# Patient Record
Sex: Female | Born: 1955 | Race: Black or African American
Health system: Southern US, Community
[De-identification: ages and names within clinical notes are randomized; demographics above are authoritative.]

## PROBLEM LIST (undated history)

## (undated) DIAGNOSIS — J45909 Unspecified asthma, uncomplicated: Secondary | ICD-10-CM

## (undated) DIAGNOSIS — E119 Type 2 diabetes mellitus without complications: Secondary | ICD-10-CM

## (undated) DIAGNOSIS — R609 Edema, unspecified: Secondary | ICD-10-CM

## (undated) DIAGNOSIS — D573 Sickle-cell trait: Secondary | ICD-10-CM

## (undated) DIAGNOSIS — I10 Essential (primary) hypertension: Secondary | ICD-10-CM

## (undated) DIAGNOSIS — J40 Bronchitis, not specified as acute or chronic: Secondary | ICD-10-CM

## (undated) DIAGNOSIS — M1990 Unspecified osteoarthritis, unspecified site: Secondary | ICD-10-CM

## (undated) DIAGNOSIS — Z8639 Personal history of other endocrine, nutritional and metabolic disease: Secondary | ICD-10-CM

## (undated) DIAGNOSIS — N390 Urinary tract infection, site not specified: Secondary | ICD-10-CM

## (undated) DIAGNOSIS — Z9109 Other allergy status, other than to drugs and biological substances: Secondary | ICD-10-CM

## (undated) DIAGNOSIS — G4730 Sleep apnea, unspecified: Secondary | ICD-10-CM

## (undated) DIAGNOSIS — I879 Disorder of vein, unspecified: Secondary | ICD-10-CM

## (undated) DIAGNOSIS — T884XXA Failed or difficult intubation, initial encounter: Secondary | ICD-10-CM

## (undated) DIAGNOSIS — D763 Other histiocytosis syndromes: Secondary | ICD-10-CM

## (undated) DIAGNOSIS — R112 Nausea with vomiting, unspecified: Secondary | ICD-10-CM

## (undated) DIAGNOSIS — E785 Hyperlipidemia, unspecified: Secondary | ICD-10-CM

## (undated) DIAGNOSIS — Z9889 Other specified postprocedural states: Secondary | ICD-10-CM

## (undated) DIAGNOSIS — E042 Nontoxic multinodular goiter: Secondary | ICD-10-CM

## (undated) DIAGNOSIS — R911 Solitary pulmonary nodule: Secondary | ICD-10-CM

## (undated) HISTORY — PX: CHOLECYSTECTOMY: SHX55

## (undated) HISTORY — PX: EXPLORATORY LAPAROTOMY: SUR591

## (undated) HISTORY — PX: ROTATOR CUFF REPAIR: SHX139

## (undated) HISTORY — PX: APPENDECTOMY: SHX54

## (undated) HISTORY — DX: Unspecified asthma, uncomplicated: J45.909

## (undated) HISTORY — PX: SINUS SURGERY: SHX187

## (undated) HISTORY — DX: Other allergy status, other than to drugs and biological substances: Z91.09

## (undated) HISTORY — PX: COLON SURGERY: SHX602

## (undated) HISTORY — PX: COLONOSCOPY: SHX174

## (undated) HISTORY — PX: EGD: SHX3789

## (undated) HISTORY — PX: SHOULDER ARTHROSCOPY: SHX128

## (undated) HISTORY — DX: Essential (primary) hypertension: I10

## (undated) HISTORY — PX: APPENDECTOMY (OPEN): SHX54

## (undated) HISTORY — PX: HYSTERECTOMY: SHX81

---

## 1996-03-31 ENCOUNTER — Emergency Department: Admit: 1996-03-31 | Payer: Self-pay | Admitting: Emergency Medicine

## 2003-08-24 HISTORY — PX: NASAL SINUS SURGERY: SHX719

## 2008-12-24 ENCOUNTER — Ambulatory Visit
Admit: 2008-12-24 | Disposition: A | Discharge status: Home / Self Care | Payer: Self-pay | Source: Ambulatory Visit | Admitting: Orthopaedic Surgery

## 2008-12-24 LAB — CBC AND DIFFERENTIAL
Basophils Absolute: 0 /mm3 (ref 0.0–0.2)
Basophils: 1 % (ref 0–2)
Eosinophils Absolute: 0.2 /mm3 (ref 0.0–0.7)
Eosinophils: 3 % (ref 0–5)
Granulocytes Absolute: 5.4 /mm3 (ref 1.8–8.1)
Hematocrit: 38.1 % (ref 37.0–47.0)
Hgb: 13.1 G/DL (ref 12.0–16.0)
Lymphocytes Absolute: 2.6 /mm3 (ref 0.5–4.4)
Lymphocytes: 30 % (ref 15–41)
MCH: 28.7 PG (ref 28.0–32.0)
MCHC: 34.4 G/DL (ref 32.0–36.0)
MCV: 83.6 FL (ref 80.0–100.0)
MPV: 12.5 FL — ABNORMAL HIGH (ref 9.4–12.3)
Monocytes Absolute: 0.5 /mm3 (ref 0.0–1.2)
Monocytes: 6 % (ref 0–11)
Neutrophils %: 62 % (ref 52–75)
Platelets: 206 /mm3 (ref 140–400)
RBC: 4.56 /mm3 (ref 4.20–5.40)
RDW: 15 % (ref 11.5–15.0)
WBC: 8.75 /mm3 (ref 3.50–10.80)

## 2008-12-24 LAB — SEDIMENTATION RATE: Sed Rate: 18 MM/HR (ref 0–26)

## 2008-12-24 LAB — TSH: TSH: 0.33 u[IU]/mL — ABNORMAL LOW (ref 0.35–4.94)

## 2008-12-24 LAB — URIC ACID: Uric acid: 6 MG/DL (ref 2.5–7.5)

## 2008-12-24 LAB — C-REACTIVE PROTEIN: C-Reactive Protein: <0.5 mg/dL (ref 0.01–0.82)

## 2008-12-25 LAB — HLA-B27, B-SOFT

## 2008-12-30 LAB — ANA SCREEN REFLEX

## 2010-08-23 DIAGNOSIS — D763 Other histiocytosis syndromes: Secondary | ICD-10-CM

## 2010-08-23 HISTORY — DX: Other histiocytosis syndromes: D76.3

## 2011-05-19 DIAGNOSIS — T884XXA Failed or difficult intubation, initial encounter: Secondary | ICD-10-CM

## 2011-05-19 HISTORY — DX: Failed or difficult intubation, initial encounter: T88.4XXA

## 2012-12-05 ENCOUNTER — Ambulatory Visit
Admission: RE | Admit: 2012-12-05 | Discharge: 2012-12-05 | Disposition: A | Discharge status: Home / Self Care | Source: Ambulatory Visit | Attending: Critical Care Medicine | Admitting: Critical Care Medicine

## 2012-12-05 DIAGNOSIS — R0602 Shortness of breath: Secondary | ICD-10-CM | POA: Insufficient documentation

## 2012-12-05 MED ORDER — SODIUM CHLORIDE 0.9 % IV SOLN
2.00 mL | Freq: Once | INTRAVENOUS | Status: AC
Start: 2012-12-05 — End: 2012-12-05
  Administered 2012-12-05: 2 mL via RESPIRATORY_TRACT
  Filled 2012-12-05: qty 5

## 2012-12-05 MED ORDER — METHACHOLINE CHLORIDE 100 MG IN SOLR
2.00 mL | Freq: Once | RESPIRATORY_TRACT | Status: DC
Start: 2012-12-05 — End: 2012-12-06
  Filled 2012-12-05: qty 50

## 2012-12-05 MED ORDER — SODIUM CHLORIDE 0.9 % IV SOLN
2.00 mL | Freq: Once | INTRAVENOUS | Status: AC
Start: 2012-12-05 — End: 2012-12-05
  Administered 2012-12-05: 2 mL via RESPIRATORY_TRACT
  Filled 2012-12-05: qty 0.05

## 2012-12-05 MED ORDER — METHACHOLINE CHLORIDE 100 MG IN SOLR
2.00 mL | Freq: Once | RESPIRATORY_TRACT | Status: DC
Start: 2012-12-05 — End: 2012-12-06
  Filled 2012-12-05: qty 20

## 2012-12-05 MED ORDER — SODIUM CHLORIDE 0.9 % IV SOLN
2.00 mL | Freq: Once | INTRAVENOUS | Status: AC
Start: 2012-12-05 — End: 2012-12-05
  Administered 2012-12-05: 2 mL via RESPIRATORY_TRACT
  Filled 2012-12-05: qty 0.5

## 2012-12-05 MED ORDER — ALBUTEROL SULFATE (2.5 MG/3ML) 0.083% IN NEBU
2.50 mg | INHALATION_SOLUTION | Freq: Four times a day (QID) | RESPIRATORY_TRACT | Status: AC | PRN
Start: 2012-12-05 — End: 2012-12-05
  Administered 2012-12-05: 2.5 mg via RESPIRATORY_TRACT
  Filled 2012-12-05: qty 3

## 2012-12-06 NOTE — Procedures (Signed)
Service Date: 12/05/2012     Patient Type: O     PHYSICIAN/PROVIDER: Marisa Sprinkles MD     REFERRING PHYSICIAN:      The methacholine challenge test was positive at level 3, implying reactive  airway disease.           D:  12/06/2012 12:34 PM by Dr. Jonny Ruiz B. Tamera Punt, MD [phone])  T:  12/06/2012 20:32 PM by [id number]      Everlean Cherry: [id number]) (Doc ID: [id number])

## 2016-03-23 ENCOUNTER — Encounter (INDEPENDENT_AMBULATORY_CARE_PROVIDER_SITE_OTHER): Payer: Self-pay

## 2016-04-01 ENCOUNTER — Ambulatory Visit (INDEPENDENT_AMBULATORY_CARE_PROVIDER_SITE_OTHER): Payer: Enrolled Prime—HMO | Admitting: Sports Medicine

## 2016-04-01 ENCOUNTER — Encounter (INDEPENDENT_AMBULATORY_CARE_PROVIDER_SITE_OTHER): Payer: Self-pay | Admitting: Sports Medicine

## 2016-04-01 VITALS — BP 104/57 | HR 90 | Ht 63.0 in | Wt 230.0 lb

## 2016-04-01 DIAGNOSIS — M7662 Achilles tendinitis, left leg: Secondary | ICD-10-CM

## 2016-04-01 NOTE — Progress Notes (Signed)
Chief Complaint   Patient presents with   . Ankle Pain     Left ankle     HPI:  Stacy Coleman is a 60 y.o.-year-old female who presents with c/o left ankle pain onset about 2 years ago. Her pain began in the posterior aspect of her heel. It then migrated to the lateral aspect of her ankle and at what sounds like the medial calcaneal tuberosity. There is associated swelling. She was seen by Dr. Herbert Pun who diagnosed her with achilles tendinopathy and plantar fasciitis. He has tried multiple treatment interventions. She has tried cortisone injections in her plantar fascia and lateral ankle (x8) with moderate short-term relief. She has been to PT and tried oral and topical NSAIDs. She began to feel like her heel became "squishy" so Dr. Herbert Pun recommended that she stop the cortisone injections and see me to try PRP. She has also tried dry needling and orthotics without relief.    She does recall having x-rays and an MRI in the past, but does not have these records available.    PMH:    Past Medical History   Diagnosis Date   . Asthma    . Environmental allergies    . Hypertension        Social History:   Social History   Substance Use Topics   . Smoking status: Never Smoker    . Smokeless tobacco: None   . Alcohol Use: Yes       Family History:    Family History   Problem Relation Age of Onset   . Diabetes Mother        Past Surgical History:    Past Surgical History   Procedure Laterality Date   . Shoulder arthroscopy     . Shoulder surgery     . Appendectomy     . Hysterectomy         Medications:    Current outpatient prescriptions:   .  ASPIR-LOW 81 MG EC tablet, , Disp: , Rfl:   .  felodipine (PLENDIL) 10 MG 24 hr tablet, , Disp: , Rfl:   .  loratadine (CLARITIN) 10 MG tablet, , Disp: , Rfl:   .  metFORMIN (GLUCOPHAGE-XR) 500 MG 24 hr tablet, , Disp: , Rfl:   .  metoprolol XL (TOPROL-XL) 25 MG 24 hr tablet, , Disp: , Rfl:   .  emollient cream, , Disp: , Rfl:   .  hydroCHLOROthiazide (HYDRODIURIL) 25 MG tablet, ,  Disp: , Rfl:   .  JANUVIA 50 MG tablet, , Disp: , Rfl:   .  lisinopril (PRINIVIL,ZESTRIL) 40 MG tablet, , Disp: , Rfl:   .  MAPAP 500 MG tablet, , Disp: , Rfl:   .  naproxen (NAPROSYN) 250 MG tablet, , Disp: , Rfl:     Allergies:    Allergies   Allergen Reactions   . Codeine        ROS:   All other systems were reviewed and are negative except as previously mentioned in the HPI.    EXAM:   BP 104/57 mmHg  Pulse 90  Ht 1.6 m (5\' 3" )  Wt 104.327 kg (230 lb)  BMI 40.75 kg/m2  Constitutional: Pt is well-developed, well-nourished, and in no distress.   HENT:   Head: Normocephalic and atraumatic.   Eyes: Conjunctivae are normal.   Neck: Neck supple.   Cardiovascular: Normal rate  Pulmonary/Chest: Effort normal.   Neurological: Pt is alert and oriented to person, place,  and time.   Skin: Skin is warm and dry. Pt is not diaphoretic.   Psychiatric: Affect normal.     Left Ankle  Observation: normal gait; + bilateral ankle edema  Palpation: tender over achilles tendon  ROM: FROM  Drawer Test: Negative  Cotton Test: negative  Tib-Fib Squeeze Test: negative  Talar Tilt Test: negative  External Rotation: negative  Dorsolateral Compression: negative  Strength testing:                                  Plantar flexion - 5 / 5                                  Eversion - 5 / 5                                  Inversion - 5 / 5                                  Dorsiflexion - 5 / 5   Thompson's Test: normal  Too Many Toes Sign: negative  Rise on Toe: normal heel varus - reproduces pain in left heel  Distal Sensory: normal  Tinel's: negative over tarsal tunnel  Distal pulses:  Intact    STUDIES: none new    ASSESSMENT/PLAN:   1. Left Achilles tendinitis       Stacy Coleman is a very pleasant 60 yo female with chronic left achilles tendinopathy that has failed multiple treatment attempts with NSAIDs, steroid injections, PT, rest, ice, orthotics. Treatment options discussed with patient at length, including risks, benefits, alternatives, and  the nature of any potential procedures for the problem. Patient elected for a trial of ultrasound guided PRP coupled with another course of PT. She was made aware of the experimental nature of this procedure. She will follow-up for this procedure at her convenience and bring the results of her prior imaging studies to this appointment. All of her questions were answered.    Stacy Dunk, MD [facility]  Primary Care Sports Medicine Physician  [facility]

## 2016-04-23 ENCOUNTER — Ambulatory Visit (INDEPENDENT_AMBULATORY_CARE_PROVIDER_SITE_OTHER): Payer: Enrolled Prime—HMO | Admitting: Sports Medicine

## 2016-04-23 ENCOUNTER — Encounter (INDEPENDENT_AMBULATORY_CARE_PROVIDER_SITE_OTHER): Payer: Self-pay | Admitting: Sports Medicine

## 2016-04-23 VITALS — BP 148/86 | HR 90 | Ht 63.0 in | Wt 230.0 lb

## 2016-04-23 DIAGNOSIS — M7662 Achilles tendinitis, left leg: Secondary | ICD-10-CM

## 2016-04-23 MED ORDER — TRAMADOL HCL 50 MG PO TABS
100.0000 mg | ORAL_TABLET | Freq: Four times a day (QID) | ORAL | Status: DC | PRN
Start: 2016-04-23 — End: 2016-06-23

## 2016-04-23 NOTE — Progress Notes (Signed)
Ultrasound-guided PRP Procedure Note     Brief History:  60 yo female with left achilles tendinopathy refractory to conservative measures presents for a trial of an ultrasound-guided PRP injection. Ultrasound evaluation prior to the procedure revealed significant tendon thickening and tendinosis with enthesopathy.    Procedure:  Pre-procedure Diagnosis: left achilles tendinopathy  Post-procedure Diagnosis: (same)  Indications: Treatment of symptomatic left achilles tendinopathy; therapeutic purposes  Reason for ultrasound guidance: Required for localization of achilles tendonosis, needle guidance during the injection, avoidance of nearby neurovascular structures, and patient comfort  Verbal informed consent was obtained prior to the procedure. Prior to this, the risks and side effects were discussed, including, but not limited to pain, infection, allergic reaction, bleeding, damage to tissues, vessels and nerves, tendon damage/rupture, steroid flare.    15 cc of blood was aspirated from the antecubital fossa and PRP was prepared in the patient's room in the usual fashion using the Ar[employer] ACP system.    Using the [employer] e [place] system, I performed a ULTRASOUND guided injection of her left achilles tendon in the area of tendinosis via a lateral, out-of-plane approach. Localization of nearby nerves and vasculature was performed prior to the procedure, taking care to avoid these structures. The patient was prepped with chlorhexidine x2 and 2 cc of 1% lidocaine prior to the procedure. Injection was performed using needle localization with the 12L Probe in long axis to the tendon with needle oriented in short axis to the probe, using a 22 ga 1.5" needle. 5 mL of PRP was then injected into the area of achilles tendinosis and the surround tissues. Several passes of needling were also performed. Permanent recording of images was done.    Complications: None; patient tolerated the procedure well.    Follow-up: Post-procedure  education and instructions were provided. The patient was instructed to use a walking boot for 1-2 days then wean as tolerated. Physical therapy was scheduled and our PRP rehab protocol was provided. The patient will follow-up in 8 weeks for a repeat ultrasound exam or sooner as needed. A prescription for Tramadol was provided for post-procedure pain.    Teena Dunk, MD  Primary Care Sports Medicine Physician  [facility]

## 2016-04-28 ENCOUNTER — Encounter (INDEPENDENT_AMBULATORY_CARE_PROVIDER_SITE_OTHER): Payer: Self-pay

## 2016-04-28 NOTE — Progress Notes (Signed)
Spoke with patient who is unable to find her PT order.  Faxed to Ms Yetta Barre at [phone] and confirmation received.  Patient is made aware that order is sent.

## 2016-06-16 NOTE — Addendum Note (Signed)
Addended by: Teena Dunk on: 06/16/2016 12:53 PM     Modules accepted: Orders

## 2016-06-23 ENCOUNTER — Ambulatory Visit (INDEPENDENT_AMBULATORY_CARE_PROVIDER_SITE_OTHER): Payer: Enrolled Prime—HMO | Admitting: Sports Medicine

## 2016-06-23 ENCOUNTER — Encounter (INDEPENDENT_AMBULATORY_CARE_PROVIDER_SITE_OTHER): Payer: Self-pay | Admitting: Sports Medicine

## 2016-06-23 VITALS — BP 133/67 | HR 75 | Ht 63.0 in | Wt 228.0 lb

## 2016-06-23 DIAGNOSIS — M7662 Achilles tendinitis, left leg: Secondary | ICD-10-CM

## 2016-06-23 DIAGNOSIS — M6788 Other specified disorders of synovium and tendon, other site: Secondary | ICD-10-CM

## 2016-06-23 NOTE — Progress Notes (Signed)
Chief Complaint   Patient presents with   . Ankle Pain     LEFT      HPI:  Stacy Coleman is a 60 y.o.-year-old female who presents for a re-evaluation about 8 weeks s/p PRP injection to her left achilles tendon for chronic tendinosis. She reports mild improvement since her last visit. She has been going to PT regularly and states this has been helping. She is still having trouble with walking long distances. Denies numbness, tingling, fevers, chills, other associated symptoms.    PMH:    Past Medical History:   Diagnosis Date   . Asthma    . Environmental allergies    . Hypertension        Past Surgical History:    Past Surgical History:   Procedure Laterality Date   . APPENDECTOMY     . HYSTERECTOMY     . SHOULDER ARTHROSCOPY     . SHOULDER SURGERY         Social History:   Social History   Substance Use Topics   . Smoking status: Never Smoker   . Smokeless tobacco: Not on file   . Alcohol use Yes       ROS:   All other systems were reviewed and are negative except as previously mentioned in the HPI.    EXAM:   BP 133/67 (BP Site: Right arm, Patient Position: Sitting, Cuff Size: Large)   Pulse 75   Ht 1.6 m (5\' 3" )   Wt 103.4 kg (228 lb)   BMI 40.39 kg/m   Left Ankle  Observation: + bilateral ankle edema  Palpation: tender over achilles tendon  ROM: FROM  Drawer Test: Negative  Cotton Test: negative  Tib-Fib Squeeze Test: negative  Talar Tilt Test: negative  External Rotation: negative  Dorsolateral Compression: negative  Strength testing:                                  Plantar flexion - 5 / 5                                  Eversion - 5 / 5                                  Inversion - 5 / 5                                  Dorsiflexion - 5 / 5   Thompson's Test: normal  Distal Sensory: normal  Tinel's: negative over tarsal tunnel  Distal pulses:  Intact    STUDIES: Ultrasound evaluation revealed significant tendon thickening and tendinosis with enthesopathy. This was mildly improved from her last  exam.    ASSESSMENT/PLAN:   1. Achilles tendinosis of left lower extremity  Ambulatory referral to Physical Therapy     Treatment options discussed with patient at length, including risks, benefits, alternatives, and the nature of any potential procedures for the problem.    Recommendations:  Relative rest and activity modification  ice / heat as needed for comfort  Physical therapy - Rx provided   Progress as tolerated  Follow up in 8 weeks or sooner as needed if symptoms worsen  Teena Dunk, MD, [facility]  Primary Care Sports Medicine Physician  [facility]

## 2016-09-01 ENCOUNTER — Encounter (INDEPENDENT_AMBULATORY_CARE_PROVIDER_SITE_OTHER): Payer: Self-pay | Admitting: Sports Medicine

## 2016-09-01 ENCOUNTER — Ambulatory Visit (INDEPENDENT_AMBULATORY_CARE_PROVIDER_SITE_OTHER): Payer: Enrolled Prime—HMO | Admitting: Sports Medicine

## 2016-09-01 VITALS — BP 124/80 | HR 78 | Resp 17 | Ht 63.0 in | Wt 227.0 lb

## 2016-09-01 DIAGNOSIS — M6788 Other specified disorders of synovium and tendon, other site: Secondary | ICD-10-CM

## 2016-09-01 DIAGNOSIS — M7662 Achilles tendinitis, left leg: Secondary | ICD-10-CM

## 2016-09-01 NOTE — Progress Notes (Signed)
Chief Complaint   Patient presents with   . Follow-up     Left achilles tendinosis.     HPI:  Stacy Coleman is a 61 y.o.-year-old female who presents for a re-evaluation of her chronic left achilles tendinopathy about 4 months s/p PRP injection. She reports mild to moderate overall improvement since the PRP injection. She has been going to PT regularly and states this has been helping. However, she is still having pain, especially with prolonged walking. Denies numbness, tingling, fevers, chills, other associated symptoms. Her PT has primarily been working on deep tissue massage and not much strengthening or mobility.    PMH:    Past Medical History:   Diagnosis Date   . Asthma    . Environmental allergies    . Hypertension        Past Surgical History:    Past Surgical History:   Procedure Laterality Date   . APPENDECTOMY     . HYSTERECTOMY     . SHOULDER ARTHROSCOPY     . SHOULDER SURGERY         Social History:   Social History   Substance Use Topics   . Smoking status: Never Smoker   . Smokeless tobacco: Never Used   . Alcohol use Yes       ROS:   All other systems were reviewed and are negative except as previously mentioned in the HPI.    EXAM:   BP 124/80   Pulse 78   Resp 17   Ht 1.6 m (5\' 3" )   Wt 103 kg (227 lb)   BMI 40.21 kg/m   Left Ankle  Observation: + bilateral ankle edema  Palpation: tender over achilles tendon insertion  ROM: FROM  Drawer Test: Negative  Cotton Test: negative  Tib-Fib Squeeze Test: negative  Talar Tilt Test: negative  External Rotation: negative  Dorsolateral Compression: negative  Strength testing:                                  Plantar flexion - 5 / 5                                  Eversion - 5 / 5                                  Inversion - 5 / 5                                  Dorsiflexion - 5 / 5   Thompson's Test: normal  Distal Sensory: normal  Tinel's: negative over tarsal tunnel  Distal pulses:  Intact    STUDIES: Repeat ultrasound evaluation today revealed  significant tendon thickening and tendinosis with enthesopathy, primarily surrounding tendon insertion. This was mildly improved from her initial exam.    ASSESSMENT/PLAN:   1. Achilles tendinosis of left lower extremity  Ambulatory referral to Physical Therapy     Stacy Coleman is a very pleasant 61 yo female with chronic left achilles tendinopathy now 4 months s/p PRP injection under u/s-guidance with mild to moderate overall relief. Treatment options discussed with patient at length, including risks, benefits, alternatives, and the nature of any potential procedures for the  problem.    Recommendations:  Relative rest and activity modification  ice / heat as needed for comfort  Physical therapy - Rx provided   Work more on stretching, strengthening  Dry needling if desired  Follow up in 2-3 months as needed  The future treatment options of repeat PRP versus surgical referral were discussed today    25 minutes were spent face-to-face with the patient, with coordination of care and counseling about disease process and expect recovery comprising > 50 percent of the visit.    Teena Dunk, MD, [facility]  Primary Care Sports Medicine Physician  [facility]

## 2016-11-17 ENCOUNTER — Encounter (INDEPENDENT_AMBULATORY_CARE_PROVIDER_SITE_OTHER): Payer: Self-pay | Admitting: Sports Medicine

## 2016-11-17 ENCOUNTER — Ambulatory Visit (INDEPENDENT_AMBULATORY_CARE_PROVIDER_SITE_OTHER): Payer: TRICARE Prime—HMO | Admitting: Sports Medicine

## 2016-11-17 VITALS — BP 143/79 | HR 89 | Resp 18 | Ht 63.0 in | Wt 228.0 lb

## 2016-11-17 DIAGNOSIS — M7662 Achilles tendinitis, left leg: Secondary | ICD-10-CM

## 2016-11-17 NOTE — Progress Notes (Signed)
Chief Complaint   Patient presents with   . Ankle Pain     F/U-Ankle pain X 1 year     HPI:  Stacy Coleman is a 61 y.o.-year-old female who presents for a re-evaluation of her chronic left achilles tendinopathy about 6 months s/p PRP injection. She reports an overall improvement of about 50% since before the PRP injection. She continues to do PT and a HEP. The dry needling seems to be particularly effective for pain relief. She is also using compression a couple of day per week with mild relief. She is pleased with her progress. She denies fevers, chills, bruising, numbness, tingling, other associated symptoms except as listed above.    PMH:    Past Medical History:   Diagnosis Date   . Asthma    . Environmental allergies    . Hypertension        Past Surgical History:    Past Surgical History:   Procedure Laterality Date   . APPENDECTOMY     . HYSTERECTOMY     . SHOULDER ARTHROSCOPY     . SHOULDER SURGERY         Social History:   Social History   Substance Use Topics   . Smoking status: Never Smoker   . Smokeless tobacco: Never Used   . Alcohol use Yes      Comment: rarely       ROS:   All other systems were reviewed and are negative except as previously mentioned in the HPI.    EXAM:   BP 143/79   Pulse 89   Resp 18   Ht 1.6 m (5\' 3" )   Wt 103.4 kg (228 lb)   BMI 40.39 kg/m   Left Ankle  Observation: + bilateral ankle edema  Palpation: tender over achilles tendon insertion  ROM: FROM  Drawer Test: Negative  Cotton Test: negative  Tib-Fib Squeeze Test: negative  Talar Tilt Test: negative  External Rotation: negative  Dorsolateral Compression: negative  Strength testing:                                  Plantar flexion - 5 / 5                                  Eversion - 5 / 5                                  Inversion - 5 / 5                                  Dorsiflexion - 5 / 5   Thompson's Test: normal  Distal Sensory: normal  Tinel's: negative over tarsal tunnel  Distal pulses:  Intact    STUDIES: none  new    ASSESSMENT/PLAN:   1. Left Achilles tendinitis  Ambulatory referral to Physical Therapy     Stacy Coleman is a very pleasant 61 yo female with chronic left achilles tendinopathy s/p PRP injection under u/s-guidance with moderate overall relief. She is pleased with her progress and would like to continue PT with dry needling as this seems to be helping. The option of a repeat PRP injection was discussed. We will  hold off on this for now as she is improving. She will follow-up with me on an as needed basis.    Recommendations:  Relative rest and activity modification  ice / heat as needed for comfort  Physical therapy - Rx provided   Dry needling  Follow up as needed    Teena Dunk, MD, [facility]  Primary Care Sports Medicine Physician  [facility]

## 2017-01-21 DIAGNOSIS — J189 Pneumonia, unspecified organism: Secondary | ICD-10-CM

## 2017-01-21 HISTORY — DX: Pneumonia, unspecified organism: J18.9

## 2017-03-09 ENCOUNTER — Ambulatory Visit (INDEPENDENT_AMBULATORY_CARE_PROVIDER_SITE_OTHER): Payer: TRICARE Prime—HMO | Admitting: Sports Medicine

## 2017-03-09 ENCOUNTER — Encounter (INDEPENDENT_AMBULATORY_CARE_PROVIDER_SITE_OTHER): Payer: Self-pay | Admitting: Sports Medicine

## 2017-03-09 VITALS — BP 128/67 | HR 82 | Ht 63.0 in | Wt 228.0 lb

## 2017-03-09 DIAGNOSIS — M6788 Other specified disorders of synovium and tendon, other site: Secondary | ICD-10-CM

## 2017-03-09 DIAGNOSIS — M7662 Achilles tendinitis, left leg: Secondary | ICD-10-CM

## 2017-03-09 NOTE — Progress Notes (Signed)
Chief Complaint   Patient presents with   . Tendonitis     F/U left achilles pain, chronic     HPI:  Stacy Coleman is a 61 y.o.-year-old female who presents for a re-evaluation of her chronic left achilles tendinopathy about 10 months s/p PRP injection. She has now completed a course of PT and is doing her HEP. She will still occasionally feel "twinges" of pain. However, overall she is very pleased with her progress.     PMH:    Past Medical History:   Diagnosis Date   . Asthma    . Environmental allergies    . Hypertension    . Pneumonia 01/2017       Past Surgical History:    Past Surgical History:   Procedure Laterality Date   . APPENDECTOMY     . HYSTERECTOMY     . SHOULDER ARTHROSCOPY     . SHOULDER SURGERY         Social History:   Social History   Substance Use Topics   . Smoking status: Never Smoker   . Smokeless tobacco: Never Used   . Alcohol use Yes      Comment: rarely       ROS:   All other systems were reviewed and are negative except as previously mentioned in the HPI.    EXAM:   BP 128/67   Pulse 82   Ht 1.6 m (5\' 3" )   Wt 103.4 kg (228 lb)   BMI 40.39 kg/m   Left Ankle  Observation: + bilateral ankle edema  Palpation: tender over achilles tendon insertion  ROM: FROM  Drawer Test: Negative  Cotton Test: negative  Tib-Fib Squeeze Test: negative  Talar Tilt Test: negative  External Rotation: negative  Dorsolateral Compression: negative  Strength testing:                                  Plantar flexion - 5 / 5                                  Eversion - 5 / 5                                  Inversion - 5 / 5                                  Dorsiflexion - 5 / 5   Thompson's Test: normal  Distal Sensory: normal  Tinel's: negative over tarsal tunnel  Distal pulses:  Intact    STUDIES: none new    ASSESSMENT/PLAN:   1. Achilles tendinosis of left lower extremity       Stacy Coleman is a very pleasant 61 yo female with chronic left achilles tendinopathy, improved s/p PRP injection under u/s-guidance in  September of 2017. She is pleased with her progress. I have recommended continuing her HEP and other conservative strategies as discussed at her last visit. She will follow-up with me on an as needed basis.    Teena Dunk, MD, [facility]  Primary Care Sports Medicine Physician  [facility]

## 2020-08-20 ENCOUNTER — Telehealth: Payer: Self-pay

## 2020-08-20 ENCOUNTER — Encounter: Payer: Self-pay | Admitting: Orthopaedic Surgery

## 2020-08-20 NOTE — Pre-Procedure Instructions (Signed)
___ Are you or anyone in your immediate family currently experiencing fever or any symptoms of acute respiratory illness (such as cough or shortness of breath)? No             ___   Are you or anyone in your immediate family currently experiencing body aches, congestion, or runny nose?  No             ___  If yes to previous question, when did symptoms began?              ___ Have you or an immediate family member traveled outside the [place] in the past 14 days? No              ___ Have you had close contact with a confirmed COVID-19 patient? Do you currently reside in a nursing home or long-term care facility? No              ___ Do you have any special requirements for care? No             ____  Are you employed in healthcare with direct patient contact? No    If  you or your family does develop symptoms, please contact your surgeon's office immediately.         Verbalized understanding    Cefazolin  Bedside glucose  Clinic appt 12-30    Last oncology note/recent labs needed from Dr. Iver Nestle [phone]    Chart sent for anesthesia review;sleep apnea/difficult intubation.

## 2020-08-20 NOTE — Progress Notes (Signed)
PREOPERATIVE CONSULTATION    Date Time:08/21/2020 10:22 AM  Patient Name: Stacy Coleman,Stacy Coleman    Reason for Consultation:     Preoperative Medical Risk Assessment    History:     HPI:   64 y.o. presents for a pre-operative appointment     Reason for Surgery: Left knee pain - osteoarthritis that has progressively gotten worse.  No relief with pain medication/conservative measures at this time.  Causing difficulty with activities of daily living.  Now requiring surgical intervention.      Diagnosis: Primary osteoarthritis of left knee [M17.12]    Procedure:  elective Procedure(s):  ARTHROPLASTY, KNEE,  MEDIAL UNICONDYLAR PROSTHESIS for worsening pain.     Date of Surgery: 09/05/2020    Requesting Surgeon:  Lamonte Sakai, MD    Recent Health (admits): no current complaints  Recent Health (denies): fever, chest pain and dyspnea  Exercise Tolerance:can perform 4 or greater METS activity without symptoms  Surgical Risk Factors: Diabetes, hypertension, sleep apnea,  morbid obesity with BMI of 42, asthma  Prior Anesthesia: Patient reports prior difficulty with intubation   NOTE form anesthesiologist: (scanned to chart.) Pt with hx of difficulty with intubation. Unsuccessfully used Mac 3, Glidescope 4, Glydescope 3. Successfully used Fiberoptic scope.     She feels generally well.       Past Medical History:     Past Medical History:   Diagnosis Date   . Arthritis    . Asthma    . Bronchitis    . Environmental allergies    . Coleman/O hyperlipidemia    . 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 Hypertension    . Pneumonia 01/2017   . Rosai-Dorfman disease 2012   . Sickle cell trait    . Sleep apnea     cpap   . Swelling     bilat ankle/feet   . Type 2 diabetes mellitus, controlled    . Urinary tract infection     Coleman/o   . Vein disorder     varicose       Past Surgical History:     Past Surgical History:   Procedure Laterality Date   . APPENDECTOMY (OPEN)      open   . CHOLECYSTECTOMY      open   . COLON SURGERY      bowel obstruction repair but no  resection per pt   . COLONOSCOPY     . EGD     . HYSTERECTOMY      partial due to fibroids   . SHOULDER ARTHROSCOPY Left     x2   . SINUS SURGERY         Family History:     Family History   Problem Relation Age of Onset   . Diabetes Mother    . Hypertension Mother        Social History:     Social History     Socioeconomic History   . Marital status: Married   Tobacco Use   . Smoking status: Never Smoker   . Smokeless tobacco: Never Used   Vaping Use   . Vaping Use: Never used   Substance and Sexual Activity   . Alcohol use: Yes     Comment: glass wine/month   . Drug use: Not Currently     Types: Marijuana     Comment: last used in college       Allergies:     Allergies   Allergen Reactions   .  Simvastatin      Unknown reaction per pt   . Latex Hives and Rash       Medications:     Prior to Admission medications    Medication Sig Start Date End Date Taking? Authorizing Provider   acetaminophen (TYLENOL) 325 MG tablet Take 650 mg by mouth every 6 (six) hours as needed for Pain    [provider]   ADVAIR HFA 230-21 MCG/ACT inhaler 2 (two) times daily. 02/21/17   [provider]   Albuterol Sulfate, sensor, (ProAir Digihaler) 108 (90 Base) MCG/ACT Aerosol Pwdr, Breath Activated Inhale into the lungs as needed    [provider]   ASPIR-LOW 81 MG EC tablet Take 81 mg by mouth nightly    03/29/16   [provider]   azelastine (ASTELIN) 0.1 % nasal spray as needed. 10/20/16   [provider]   cetirizine (ZyrTEC) 10 MG tablet Take 10 mg by mouth nightly    [provider]   DEEP SEA NASAL SPRAY 0.65 % nasal spray  08/31/16   [provider]   felodipine (PLENDIL) 10 MG 24 hr tablet Take 10 mg by mouth every morning    02/19/16   [provider]   glimepiride (AMARYL) 1 MG tablet Take 1 mg by mouth every morning before breakfast    [provider]   hydroCHLOROthiazide (HYDRODIURIL) 25 MG tablet Take 25 mg by mouth every morning Takes Brand name  Oretic   02/19/16   [provider]   hydroCHLOROthiazide (MICROZIDE) 12.5 MG capsule daily.    [provider]   JANUVIA 50 MG tablet  02/19/16   [provider]   Jerene Dilling 10 MEQ tablet daily. 08/17/16   [provider]   lidocaine-prilocaine (EMLA) cream daily. 12/22/16   [provider]   lisinopril (PRINIVIL,ZESTRIL) 40 MG tablet Take 40 mg by mouth every morning    02/19/16   [provider]   loratadine (CLARITIN) 10 MG tablet  03/03/16   [provider]   metFORMIN (GLUCOPHAGE-XR) 500 MG 24 hr tablet  02/19/16   [provider]   metoprolol succinate XL (TOPROL-XL) 25 MG 24 hr tablet nightly. 08/17/16   [provider]   montelukast (SINGULAIR) 10 MG tablet Take 10 mg by mouth every morning    [provider]   REFRESH PLUS 0.5 % ophthalmic solution as needed. 03/01/17   [provider]   SITagliptin (JANUVIA) 50 MG tablet 2 (two) times daily.    [provider]   SITagliptin-MetFORMIN HCl (Janumet XR) [phone] MG Tablet SR 24 hr Take by mouth nightly    [provider]   Lactobacillus Reuteri (BIOGAIA) Chew Tab daily. 09/08/16 08/20/20  [provider]   See revised list in EPIC    Review of Systems:   A comprehensive review of systems was obtained from chart review and the patient.    ROS:  Constitutional: Negative fever/chills, general malaise. At baseline general health.   Eyes: Negative for new visual changes.  ENT: Negative for sore throat, chronic nasal discharge.   Respiratory: Negative for shortness of breath,chronic cough.   Cardiovascular: Negative for chest pain with exertion/rest.    Gastrointestinal: Negative for nausea/vomiting diarrhea, or bloody stool.   Endocrine: Negative for polydipsia and polyuria.  Genitourinary: Negative for urinary retention   Peripheral Vascular: Negative for exertional pain/cramping in legs.  Skin: Negative for rash or open wounds   Neurological:  Negative  for dizziness, pre-syncope, focal weakness  Hematological: neg for easy bruise/bleed. Note hx of Rosai-Dorfman syndrome   Musculoskeletal: Positive for joint pain      Physical Exam:     Vitals:    08/21/20 0914   BP: 154/84   Pulse: 70   Temp: 98.1 F (36.7 C)   SpO2: 100%       Physical Examination:   Constitutional:   Appears generally well with no distress.   Eyes: Non icteric.   Head: Normocephalic.   Mouth/Throat: Oropharynx is clear without erythema or exudate.   Mallampati: Class IV (only hard palate visible)  Neck: Neck supple,No lymphadenopathy.   Cardiovascular: RRR with normal S1/S2. No murmer  Pulmonary/Chest: BBS clear to auscultation bilaterally. No wheezes, rhonchi or rales. Non labored.   Abdominal: soft, no distension. No abdominal tenderness.  Extremities: No clubbing, cyanosis. No edema  Peripheral Pulses:+posterior tibial pulses palpable.   Neurological: No gross focal deficits  Psychiatric: Alert and oriented to person, place, and time.   Musculoskeletal - Left Knee OA changes with tenderness and painful ROM  Back: Normal back, no scoliosis/kyphosis  No prior surgical scars.      Labs:     Results     Procedure Component Value Units Date/Time    ADULT Urinalysis Reflex to Microscopic Exam - Reflex to Culture [[id number]]  (Abnormal) Collected: 08/21/20 0909     Updated: 08/21/20 1021     Urine Type Urine, Clean Ca     Color, UA Straw     Clarity, UA Clear     Specific Gravity UA 1.012     Urine pH 6.0     Leukocyte Esterase, UA Negative     Nitrite, UA Negative     Protein, UR 30     Glucose, UA Negative     Ketones UA Negative     Urobilinogen, UA Negative mg/dL      Bilirubin, UA Negative     Blood, UA Negative     RBC, UA 0 - 2 /hpf      WBC, UA 0 - 5 /hpf      Squamous Epithelial Cells, Urine 0 - 5 /hpf     Narrative:      Per protocol    APTT [[id number]] Collected: 08/21/20 0909     Updated: 08/21/20 1018     PTT 38 sec     Prothrombin time/INR [[id number]] Collected: 08/21/20 0909     Specimen: Blood Updated: 08/21/20 1018     PT 11.2 sec      PT INR 1.0    GFR [[id number]] Collected: 08/21/20 0909     Updated: 08/21/20 1016     EGFR >60.0    Comprehensive metabolic panel [[id number]]  (Abnormal) Collected: 08/21/20 0909    Specimen: Blood Updated: 08/21/20 1016     Glucose 107 mg/dL      BUN 17 mg/dL      Creatinine 1.0 mg/dL      Sodium 244 mEq/L      Potassium 3.5 mEq/L      Chloride 107 mEq/L      CO2 27 mEq/L      Calcium 9.7 mg/dL      Protein, Total 6.9 g/dL      Albumin 4.0 g/dL      AST (SGOT) 11 U/L      ALT 12 U/L      Alkaline Phosphatase 87 U/L      Bilirubin, Total 0.2 mg/dL  Globulin 2.9 g/dL      Albumin/Globulin Ratio 1.4     Anion Gap 11.0    CBC and differential [[id number]]  (Abnormal) Collected: 08/21/20 0909    Specimen: Blood Updated: 08/21/20 0955     WBC 9.32 x10 3/uL      Hgb 13.1 g/dL      Hematocrit 47.8 %      Platelets 200 x10 3/uL      RBC 4.71 x10 6/uL      MCV 86.4 fL      MCH 27.8 pg      MCHC 32.2 g/dL      RDW 15 %      MPV 12.9 fL      Neutrophils 64.5 %      Lymphocytes Automated 24.9 %      Monocytes 7.5 %      Eosinophils Automated 2.0 %      Basophils Automated 0.5 %      Immature Granulocytes 0.6 %      Nucleated RBC 0.0 /100 WBC      Neutrophils Absolute 6.00 x10 3/uL      Lymphocytes Absolute Automated 2.32 x10 3/uL      Monocytes Absolute Automated 0.70 x10 3/uL      Eosinophils Absolute Automated 0.19 x10 3/uL      Basophils Absolute Automated 0.05 x10 3/uL      Immature Granulocytes Absolute 0.06 x10 3/uL      Absolute NRBC 0.00 x10 3/uL     Hemoglobin A1C [[id number]] Collected: 08/21/20 0909    Specimen: Blood Updated: 08/21/20 0937    Presurgical Surveillance, MSSA+MRSA [[id number]] Collected: 08/21/20 0909    Specimen: Throat Updated: 08/21/20 0937    Presurgical Surveillance, MSSA+MRSA [[id number]] Collected: 08/21/20 0909    Specimen: Nares Updated: 08/21/20 0937          Rads:     Radiology Results (24 Hour)     ** No results found for the  last 24 hours. **          EKG: normal EKG, normal sinus rhythm, there are no previous tracings available for comparison, Non specific inferior changes.     Assessment/Plan:     Pre-Operative Evaluation: 64 y.o. 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    Anesthesia Risk: Mallampati: Class IV (only hard palate visible)                                History of OSA: yes - CPAP use          Cervical spine/mobility issues:no                     Prior neck/back surgery: no                                Dental exam: CROWN    Anticoagulation Instructions: not applicable    Antiplatelet Agent Instructions: not applicable and Stop Aspirin 7 days prior to surgery if primary prevention or high dose (325 mg)    Diabetes Medication  Instructions: Perioperative blood glucose monitoring per Anesthesia, Hold long-acting Sulfonylureas (Glyburide, Glimepiride) 2-3 days prior to surgery.   and Hold Metformin (Glucophage) on day prior to surgery    Alternative Medication Instructions: HTN: Continue beta blocker, calcium channel blocker.   Hold Diuretic, Ace Inhibitor, ARB 24 hours prior to surgery    NPO status per nursing instructions     No NSAIDS, Supplements, Herbal Teas and Alcoholic beverages 10 days prior to surgery and hold iron and vitamins on the day of surgery as per preop instructions/reviewed by nursing staff.       DVT and GI prophylaxis per orthopedics.     Med Rec completed for pre-op today and discussed with patient.     Note issues with intubation as above    Will have heme/onc forward note re:  recommendations/concerns related to Rosai-Dorfman syndrome.     Thank you for consultation. Pt is medically stable and at acceptable risk for planned surgical procedure.     Documentation available for Orthopedic review     Signed WJ:XBJYN Electa Sniff, NP

## 2020-08-21 ENCOUNTER — Encounter: Payer: Self-pay | Admitting: Acute Care

## 2020-08-21 ENCOUNTER — Ambulatory Visit: Payer: Medicare Other | Attending: Orthopaedic Surgery | Admitting: Acute Care

## 2020-08-21 VITALS — BP 154/84 | HR 70 | Temp 98.1°F

## 2020-08-21 DIAGNOSIS — Z01818 Encounter for other preprocedural examination: Secondary | ICD-10-CM

## 2020-08-21 DIAGNOSIS — Z01812 Encounter for preprocedural laboratory examination: Secondary | ICD-10-CM | POA: Insufficient documentation

## 2020-08-21 DIAGNOSIS — E119 Type 2 diabetes mellitus without complications: Secondary | ICD-10-CM | POA: Insufficient documentation

## 2020-08-21 DIAGNOSIS — M1712 Unilateral primary osteoarthritis, left knee: Secondary | ICD-10-CM | POA: Insufficient documentation

## 2020-08-21 DIAGNOSIS — R9431 Abnormal electrocardiogram [ECG] [EKG]: Secondary | ICD-10-CM | POA: Insufficient documentation

## 2020-08-21 LAB — URINALYSIS REFLEX TO MICROSCOPIC EXAM - REFLEX TO CULTURE
Bilirubin, UA: NEGATIVE
Blood, UA: NEGATIVE
Glucose, UA: NEGATIVE
Ketones UA: NEGATIVE
Leukocyte Esterase, UA: NEGATIVE
Nitrite, UA: NEGATIVE
Protein, UR: 30 — AB
Specific Gravity UA: 1.012 (ref 1.001–1.035)
Urine pH: 6 (ref 5.0–8.0)
Urobilinogen, UA: NEGATIVE mg/dL (ref 0.2–2.0)

## 2020-08-21 LAB — COMPREHENSIVE METABOLIC PANEL
ALT: 12 U/L (ref 0–55)
AST (SGOT): 11 U/L (ref 5–34)
Albumin/Globulin Ratio: 1.4 (ref 0.9–2.2)
Albumin: 4 g/dL (ref 3.5–5.0)
Alkaline Phosphatase: 87 U/L (ref 37–106)
Anion Gap: 11 (ref 5.0–15.0)
BUN: 17 mg/dL (ref 7–19)
Bilirubin, Total: 0.2 mg/dL (ref 0.2–1.2)
CO2: 27 mEq/L (ref 22–29)
Calcium: 9.7 mg/dL (ref 8.5–10.5)
Chloride: 107 mEq/L (ref 100–111)
Creatinine: 1 mg/dL (ref 0.6–1.0)
Globulin: 2.9 g/dL (ref 2.0–3.6)
Glucose: 107 mg/dL — ABNORMAL HIGH (ref 70–100)
Potassium: 3.5 mEq/L (ref 3.5–5.1)
Protein, Total: 6.9 g/dL (ref 6.0–8.3)
Sodium: 145 mEq/L (ref 136–145)

## 2020-08-21 LAB — ECG 12-LEAD
Atrial Rate: 0 {beats}/min
Atrial Rate: 66 {beats}/min
Atrial Rate: 66 {beats}/min
Atrial Rate: 66 {beats}/min
Atrial Rate: 69 {beats}/min
Atrial Rate: 71 {beats}/min
Atrial Rate: 64 {beats}/min
Atrial Rate: 67 {beats}/min
Atrial Rate: 67 {beats}/min
P Axis: 62 degrees
P Axis: 72 degrees
P Axis: 76 degrees
P Axis: 110 degrees
P Axis: 60 degrees
P-R Interval: 124 ms
P-R Interval: 134 ms
P-R Interval: 134 ms
P-R Interval: 136 ms
P-R Interval: 144 ms
P-R Interval: 152 ms
Q-T Interval: 416 ms
Q-T Interval: 424 ms
Q-T Interval: 424 ms
Q-T Interval: 430 ms
Q-T Interval: 436 ms
Q-T Interval: 438 ms
Q-T Interval: 420 ms
Q-T Interval: 438 ms
Q-T Interval: 440 ms
QRS Duration: 66 ms
QRS Duration: 74 ms
QRS Duration: 76 ms
QRS Duration: 78 ms
QRS Duration: 90 ms
QRS Duration: 90 ms
QRS Duration: 62 ms
QRS Duration: 78 ms
QRS Duration: 86 ms
QTC Calculation (Bezet): 433 ms
QTC Calculation (Bezet): 444 ms
QTC Calculation (Bezet): 445 ms
QTC Calculation (Bezet): 450 ms
QTC Calculation (Bezet): 463 ms
QTC Calculation (Bezet): 475 ms
QTC Calculation (Bezet): 443 ms
QTC Calculation (Bezet): 451 ms
QTC Calculation (Bezet): 464 ms
R Axis: 0 degrees
R Axis: 2 degrees
R Axis: 5 degrees
R Axis: 7 degrees
R Axis: 7 degrees
R Axis: 8 degrees
R Axis: 3 degrees
R Axis: 4 degrees
R Axis: 9 degrees
T Axis: 0 degrees
T Axis: 15 degrees
T Axis: 18 degrees
T Axis: 20 degrees
T Axis: 6 degrees
T Axis: 8 degrees
T Axis: 1 degrees
T Axis: 18 degrees
T Axis: 8 degrees
Ventricular Rate: 63 {beats}/min
Ventricular Rate: 66 {beats}/min
Ventricular Rate: 66 {beats}/min
Ventricular Rate: 68 {beats}/min
Ventricular Rate: 69 {beats}/min
Ventricular Rate: 71 {beats}/min
Ventricular Rate: 64 {beats}/min
Ventricular Rate: 67 {beats}/min
Ventricular Rate: 67 {beats}/min

## 2020-08-21 LAB — HEMOGLOBIN A1C
Average Estimated Glucose: 114 mg/dL
Hemoglobin A1C: 5.6 % (ref 4.6–5.9)

## 2020-08-21 LAB — CBC AND DIFFERENTIAL
Absolute NRBC: 0 10*3/uL (ref 0.00–0.00)
Basophils Absolute Automated: 0.05 10*3/uL (ref 0.00–0.08)
Basophils Automated: 0.5 %
Eosinophils Absolute Automated: 0.19 10*3/uL (ref 0.00–0.44)
Eosinophils Automated: 2 %
Hematocrit: 40.7 % (ref 34.7–43.7)
Hgb: 13.1 g/dL (ref 11.4–14.8)
Immature Granulocytes Absolute: 0.06 10*3/uL (ref 0.00–0.07)
Immature Granulocytes: 0.6 %
Lymphocytes Absolute Automated: 2.32 10*3/uL (ref 0.42–3.22)
Lymphocytes Automated: 24.9 %
MCH: 27.8 pg (ref 25.1–33.5)
MCHC: 32.2 g/dL (ref 31.5–35.8)
MCV: 86.4 fL (ref 78.0–96.0)
MPV: 12.9 fL — ABNORMAL HIGH (ref 8.9–12.5)
Monocytes Absolute Automated: 0.7 10*3/uL (ref 0.21–0.85)
Monocytes: 7.5 %
Neutrophils Absolute: 6 10*3/uL (ref 1.10–6.33)
Neutrophils: 64.5 %
Nucleated RBC: 0 /100 WBC (ref 0.0–0.0)
Platelets: 200 10*3/uL (ref 142–346)
RBC: 4.71 10*6/uL (ref 3.90–5.10)
RDW: 15 % (ref 11–15)
WBC: 9.32 10*3/uL (ref 3.10–9.50)

## 2020-08-21 LAB — PT/INR
PT INR: 1 (ref 0.9–1.1)
PT: 11.2 s (ref 10.1–12.9)

## 2020-08-21 LAB — GFR: EGFR: >60

## 2020-08-21 LAB — APTT: PTT: 38 s (ref 27–39)

## 2020-08-21 NOTE — PT Plan of Care Note (Signed)
[facility]  [address]  [place] [address]  [phone]    Physical Therapy Pre-Operative Intake Form    Patient: Stacy Coleman MRN: [id number]   Unit: [facility]     Precautions  Total Knee Replacement:  (L uni knee, [place], 09/05/20)  Other Precautions: Plans to go home DOS    Social History   Prior Level of Function  Prior level of function: Independent with ADLs;Ambulates independently  Baseline Activity Level: Community ambulation  Driving: independent  Employment: Retired  DME Currently at Home: [employer], [employer] (needs a FWW from [place])    Home Living Arrangements  Living Arrangements: Spouse/significant other  Home Layout: Multi-level (2 STE no rail, 12 to BR w/rail)  Bathroom Shower/Tub: Pension scheme manager: Raised  DME Currently at Home: [employer], [employer] (needs a FWW from [place])    Subjective    Patient is agreeable to participation in the therapy session. Patient has been instructed in DME and need for coach after sx.   Patient Goal  Patient Goal: increased mobility to ex    Pain Assessment  Pain Assessment: Numeric Scale (0-10)  Patient's Stated Comfort Functional Goal: 5-moderate pain     Functional Limits  Pain Level Best: 0  Pain Level Worst: 10    Observation of patient:                                                                        Reviewed  post-operative therapy plans.    Additional patient/coach education provided per Education Tab.      Plan      Initiate post-op Physical Therapy Evaluation per post-op MD orders.       Signature: Howie Ill, PT 08/21/2020 10:39 AM

## 2020-08-21 NOTE — Pre-Procedure Instructions (Signed)
Patient received with written and verbal instructions regarding clear liquids 4 hours prior to surgery, patient verbalized understanding.   No NSAIDS, Vitamins, Supplements, Herbal Teas and Alcoholic beverages 7 days prior to surgery as per preop instructions written and verbal/reviewed, patient verbalized understanding  Chlorhexidine shower instructions reviewed, patient received written and verbal teaching.

## 2020-08-22 LAB — PRESURGICAL SURVEILLANCE, MSSA+MRSA

## 2020-09-04 NOTE — Anesthesia Preprocedure Evaluation (Addendum)
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, histiocytes  varicose veins    PSHx:  shoulder arthroscopy x2  Colonoscopy/EGD  partial hysterectomy  Sinus surgery  open appy  open cholecystectomy    SHx:  never smoker    Medications:  Advair BID  albuterol PRN  aspirin 81mg  nightly  azelastine nasal spray PRN  cetirizine 10mg  nightly  felodipine 10mg  Q24h  glimepiride 1mg  daily  HCTZ 25mg  daily  lisinopril 40mg  daily  Toprol-XL 25mg  nightly  singulair 10mg  daily  Sitagliptin-metformin 50-1000mg  daily      Labs:  CBC: 9.32 > 13.1 / 40.7 < 200  BMP: 145 / 3.5 / 107 / 27 / 17 / 1.0 < 107  PT/INR/PTT: 11.2 / 1.0 / 38  A1c 5.6%    EKG: no image available, documented as NSR, possible inferior MI            Anesthesia Plan    ASA 3     CSE                     intravenous induction   Detailed anesthesia plan: CSE, PNB, MAC and general LMA      Post Op: plan for postoperative opioid use    Post op pain management: per surgeon          pertinent labs reviewed             Signed by: Buren Kos, MD 09/04/20 1:12 PM

## 2020-09-05 ENCOUNTER — Encounter: Payer: Self-pay | Admitting: Orthopaedic Surgery

## 2020-09-05 ENCOUNTER — Ambulatory Visit: Payer: Medicare Other

## 2020-09-05 ENCOUNTER — Ambulatory Visit: Payer: Medicare Other | Admitting: Student

## 2020-09-05 ENCOUNTER — Encounter
Admission: RE | Disposition: A | Discharge status: Home / Self Care | Payer: Self-pay | Source: Ambulatory Visit | Attending: Orthopaedic Surgery

## 2020-09-05 ENCOUNTER — Ambulatory Visit
Admission: RE | Admit: 2020-09-05 | Discharge: 2020-09-05 | Disposition: A | Discharge status: Home / Self Care | Payer: Medicare Other | Source: Ambulatory Visit | Attending: Orthopaedic Surgery | Admitting: Orthopaedic Surgery

## 2020-09-05 DIAGNOSIS — M1712 Unilateral primary osteoarthritis, left knee: Secondary | ICD-10-CM | POA: Insufficient documentation

## 2020-09-05 DIAGNOSIS — M25762 Osteophyte, left knee: Secondary | ICD-10-CM | POA: Insufficient documentation

## 2020-09-05 DIAGNOSIS — I8390 Asymptomatic varicose veins of unspecified lower extremity: Secondary | ICD-10-CM | POA: Insufficient documentation

## 2020-09-05 DIAGNOSIS — J45909 Unspecified asthma, uncomplicated: Secondary | ICD-10-CM | POA: Insufficient documentation

## 2020-09-05 DIAGNOSIS — Z6841 Body Mass Index (BMI) 40.0 and over, adult: Secondary | ICD-10-CM | POA: Insufficient documentation

## 2020-09-05 DIAGNOSIS — D763 Other histiocytosis syndromes: Secondary | ICD-10-CM | POA: Insufficient documentation

## 2020-09-05 DIAGNOSIS — D573 Sickle-cell trait: Secondary | ICD-10-CM | POA: Insufficient documentation

## 2020-09-05 DIAGNOSIS — I10 Essential (primary) hypertension: Secondary | ICD-10-CM | POA: Insufficient documentation

## 2020-09-05 HISTORY — PX: MEDIAL PARTIAL KNEE REPLACEMENT: SHX5965

## 2020-09-05 HISTORY — DX: Failed or difficult intubation, initial encounter: T88.4XXA

## 2020-09-05 HISTORY — DX: Urinary tract infection, site not specified: N39.0

## 2020-09-05 HISTORY — DX: Disorder of vein, unspecified: I87.9

## 2020-09-05 HISTORY — DX: Personal history of other endocrine, nutritional and metabolic disease: Z86.39

## 2020-09-05 HISTORY — DX: Sleep apnea, unspecified: G47.30

## 2020-09-05 HISTORY — PX: ARTHROPLASTY, KNEE, UNICONDYLAR PROSTHESIS: SHX3138

## 2020-09-05 HISTORY — DX: Bronchitis, not specified as acute or chronic: J40

## 2020-09-05 HISTORY — DX: Sickle-cell trait: D57.3

## 2020-09-05 HISTORY — DX: Type 2 diabetes mellitus without complications: E11.9

## 2020-09-05 HISTORY — DX: Unspecified osteoarthritis, unspecified site: M19.90

## 2020-09-05 HISTORY — DX: Edema, unspecified: R60.9

## 2020-09-05 LAB — GLUCOSE WHOLE BLOOD - POCT: Whole Blood Glucose POCT: 157 mg/dL — ABNORMAL HIGH (ref 70–100)

## 2020-09-05 SURGERY — ARTHROPLASTY, KNEE, UNICONDYLAR PROSTHESIS
Anesthesia: Regional | Site: Knee | Laterality: Left | Wound class: Clean

## 2020-09-05 MED ORDER — ONDANSETRON HCL 4 MG/2ML IJ SOLN
INTRAMUSCULAR | Status: DC | PRN
Start: 2020-09-05 — End: 2020-09-05
  Administered 2020-09-05: 4 mg via INTRAVENOUS

## 2020-09-05 MED ORDER — OXYCODONE HCL ER 10 MG PO T12A
10.0000 mg | EXTENDED_RELEASE_TABLET | Freq: Once | ORAL | Status: DC | PRN
Start: 2020-09-05 — End: 2020-09-05

## 2020-09-05 MED ORDER — PROPOFOL 10 MG/ML IV EMUL (WRAP)
INTRAVENOUS | Status: AC
Start: 2020-09-05 — End: ?
  Filled 2020-09-05: qty 50

## 2020-09-05 MED ORDER — CEFAZOLIN SODIUM-DEXTROSE 2-5 GM/50ML-% IV SOLN
2.0000 g | Freq: Once | INTRAVENOUS | Status: AC
Start: 2020-09-05 — End: 2020-09-05
  Administered 2020-09-05: 2 g via INTRAVENOUS

## 2020-09-05 MED ORDER — CELECOXIB 200 MG PO CAPS
400.0000 mg | ORAL_CAPSULE | ORAL | Status: AC
Start: 2020-09-05 — End: 2020-09-05
  Administered 2020-09-05: 07:00:00 400 mg via ORAL

## 2020-09-05 MED ORDER — LABETALOL HCL 5 MG/ML IV SOLN (WRAP)
10.0000 mg | INTRAVENOUS | Status: DC | PRN
Start: 2020-09-05 — End: 2020-09-05

## 2020-09-05 MED ORDER — CEFAZOLIN SODIUM-DEXTROSE 2-5 GM/50ML-% IV SOLN
INTRAVENOUS | Status: AC
Start: 2020-09-05 — End: ?
  Filled 2020-09-05: qty 50

## 2020-09-05 MED ORDER — LIDOCAINE HCL 2 % IJ SOLN
INTRAMUSCULAR | Status: DC | PRN
Start: 2020-09-05 — End: 2020-09-05
  Administered 2020-09-05: 100 mg via INTRAVENOUS

## 2020-09-05 MED ORDER — TRAMADOL HCL 50 MG PO TABS
50.0000 mg | ORAL_TABLET | Freq: Four times a day (QID) | ORAL | 0 refills | Status: AC | PRN
Start: 2020-09-05 — End: ?

## 2020-09-05 MED ORDER — HYDROMORPHONE HCL 2 MG PO TABS
2.0000 mg | ORAL_TABLET | Freq: Once | ORAL | Status: DC | PRN
Start: 2020-09-05 — End: 2020-09-05

## 2020-09-05 MED ORDER — OXYCODONE HCL 5 MG PO TABS
5.0000 mg | ORAL_TABLET | ORAL | 0 refills | Status: AC | PRN
Start: 2020-09-05 — End: ?

## 2020-09-05 MED ORDER — PREGABALIN 75 MG PO CAPS
75.0000 mg | ORAL_CAPSULE | ORAL | Status: AC
Start: 2020-09-05 — End: 2020-09-05
  Administered 2020-09-05: 07:00:00 75 mg via ORAL

## 2020-09-05 MED ORDER — EPHEDRINE SULFATE 50 MG/ML IJ/IV SOLN (WRAP)
Status: AC
Start: 2020-09-05 — End: ?
  Filled 2020-09-05: qty 1

## 2020-09-05 MED ORDER — LACTATED RINGERS IV SOLN
INTRAVENOUS | Status: DC
Start: 2020-09-05 — End: 2020-09-05

## 2020-09-05 MED ORDER — EPHEDRINE SULFATE 50 MG/ML IJ/IV SOLN (WRAP)
Status: DC | PRN
Start: 2020-09-05 — End: 2020-09-05
  Administered 2020-09-05: 10 mg via INTRAVENOUS

## 2020-09-05 MED ORDER — SCOPOLAMINE 1 MG/3DAYS TD PT72
1.0000 | MEDICATED_PATCH | Freq: Once | TRANSDERMAL | Status: DC
Start: 2020-09-05 — End: 2020-09-05
  Administered 2020-09-05: 07:00:00 1 via TRANSDERMAL

## 2020-09-05 MED ORDER — ACETAMINOPHEN 500 MG PO TABS
1000.0000 mg | ORAL_TABLET | Freq: Once | ORAL | Status: AC
Start: 2020-09-05 — End: 2020-09-05
  Administered 2020-09-05: 12:00:00 1000 mg via ORAL

## 2020-09-05 MED ORDER — DEXAMETHASONE SODIUM PHOSPHATE 4 MG/ML IJ SOLN (WRAP)
INTRAMUSCULAR | Status: DC | PRN
Start: 2020-09-05 — End: 2020-09-05
  Administered 2020-09-05: 8 mg via INTRAVENOUS

## 2020-09-05 MED ORDER — TRANEXAMIC ACID 1000 MG/10ML IV SOLN
1000.0000 mg | Freq: Once | INTRAVENOUS | Status: AC
Start: 2020-09-05 — End: 2020-09-05
  Administered 2020-09-05: 11:00:00 1000 mg via INTRAVENOUS

## 2020-09-05 MED ORDER — LIDOCAINE HCL (PF) 2 % IJ SOLN
INTRAMUSCULAR | Status: AC
Start: 2020-09-05 — End: ?
  Filled 2020-09-05: qty 5

## 2020-09-05 MED ORDER — ROPIVACAINE HCL 5 MG/ML IJ SOLN
INTRAMUSCULAR | Status: DC | PRN
Start: 2020-09-05 — End: 2020-09-05
  Administered 2020-09-05: 20 mL via PERINEURAL

## 2020-09-05 MED ORDER — ONDANSETRON HCL 4 MG/2ML IJ SOLN
4.0000 mg | Freq: Once | INTRAMUSCULAR | Status: AC | PRN
Start: 2020-09-05 — End: 2020-09-05
  Administered 2020-09-05: 11:00:00 4 mg via INTRAVENOUS

## 2020-09-05 MED ORDER — ACETAMINOPHEN 500 MG PO TABS
1000.0000 mg | ORAL_TABLET | Freq: Three times a day (TID) | ORAL | Status: AC | PRN
Start: 2020-09-05 — End: 2020-10-05

## 2020-09-05 MED ORDER — ACETAMINOPHEN 500 MG PO TABS
1000.0000 mg | ORAL_TABLET | ORAL | Status: AC
Start: 2020-09-05 — End: 2020-09-05
  Administered 2020-09-05: 07:00:00 1000 mg via ORAL

## 2020-09-05 MED ORDER — CEFAZOLIN SODIUM 1 G IJ SOLR
2.0000 g | INTRAMUSCULAR | Status: AC
Start: 2020-09-05 — End: 2020-09-05
  Administered 2020-09-05: 09:00:00 2 g via INTRAVENOUS

## 2020-09-05 MED ORDER — DEXAMETHASONE SOD PHOSPHATE PF 10 MG/ML IJ SOLN
INTRAMUSCULAR | Status: AC
Start: 2020-09-05 — End: ?
  Filled 2020-09-05: qty 1

## 2020-09-05 MED ORDER — FENTANYL CITRATE (PF) 50 MCG/ML IJ SOLN (WRAP)
50.0000 ug | INTRAMUSCULAR | Status: AC | PRN
Start: 2020-09-05 — End: 2020-09-05
  Administered 2020-09-05 (×4): 50 ug via INTRAVENOUS

## 2020-09-05 MED ORDER — ONDANSETRON HCL 4 MG/2ML IJ SOLN
INTRAMUSCULAR | Status: AC
Start: 2020-09-05 — End: ?
  Filled 2020-09-05: qty 2

## 2020-09-05 MED ORDER — HYDROCODONE-ACETAMINOPHEN 5-325 MG PO TABS
1.0000 | ORAL_TABLET | Freq: Once | ORAL | Status: DC | PRN
Start: 2020-09-05 — End: 2020-09-05

## 2020-09-05 MED ORDER — PROMETHAZINE HCL 25 MG/ML IJ SOLN
6.2500 mg | Freq: Once | INTRAMUSCULAR | Status: DC | PRN
Start: 2020-09-05 — End: 2020-09-05

## 2020-09-05 MED ORDER — HYDROMORPHONE HCL 0.5 MG/0.5 ML IJ SOLN
0.5000 mg | INTRAMUSCULAR | Status: DC | PRN
Start: 2020-09-05 — End: 2020-09-05

## 2020-09-05 MED ORDER — MIDAZOLAM HCL 1 MG/ML IJ SOLN (WRAP)
INTRAMUSCULAR | Status: DC | PRN
Start: 2020-09-05 — End: 2020-09-05
  Administered 2020-09-05: 2 mg via INTRAVENOUS

## 2020-09-05 MED ORDER — SODIUM CHLORIDE 0.9 % IV MBP
INTRAVENOUS | Status: AC
Start: 2020-09-05 — End: ?
  Filled 2020-09-05: qty 100

## 2020-09-05 MED ORDER — ONDANSETRON HCL 4 MG PO TABS
4.0000 mg | ORAL_TABLET | Freq: Three times a day (TID) | ORAL | Status: AC | PRN
Start: 2020-09-05 — End: 2020-10-05

## 2020-09-05 MED ORDER — TRANEXAMIC ACID 1000 MG/10ML IV SOLN
INTRAVENOUS | Status: AC
Start: 2020-09-05 — End: ?
  Filled 2020-09-05: qty 10

## 2020-09-05 MED ORDER — OXYCODONE HCL 5 MG PO TABS
5.0000 mg | ORAL_TABLET | Freq: Once | ORAL | Status: AC
Start: 2020-09-05 — End: 2020-09-05
  Administered 2020-09-05: 11:00:00 5 mg via ORAL

## 2020-09-05 MED ORDER — LACTATED RINGERS IV SOLN
50.0000 mL/h | INTRAVENOUS | Status: DC
Start: 2020-09-05 — End: 2020-09-05

## 2020-09-05 MED ORDER — TRANEXAMIC ACID-NACL 1000-0.7 MG/100ML-% IV SOLN
1000.0000 mg | INTRAVENOUS | Status: AC
Start: 2020-09-05 — End: 2020-09-05
  Administered 2020-09-05: 09:00:00 1000 mg via INTRAVENOUS

## 2020-09-05 MED ORDER — ROPIVACAINE HCL 5 MG/ML IJ SOLN
INTRAMUSCULAR | Status: DC | PRN
Start: 2020-09-05 — End: 2020-09-05
  Administered 2020-09-05: 30 mL

## 2020-09-05 MED ORDER — PROPOFOL INFUSION 10 MG/ML
INTRAVENOUS | Status: DC | PRN
Start: 2020-09-05 — End: 2020-09-05
  Administered 2020-09-05: 100 ug/kg/min via INTRAVENOUS

## 2020-09-05 MED ORDER — OXYCODONE HCL 5 MG PO TABS
ORAL_TABLET | ORAL | Status: AC
Start: 2020-09-05 — End: ?
  Filled 2020-09-05: qty 1

## 2020-09-05 MED ORDER — ASPIRIN 81 MG PO TBEC
81.0000 mg | DELAYED_RELEASE_TABLET | Freq: Two times a day (BID) | ORAL | 0 refills | Status: AC
Start: 2020-09-05 — End: 2020-10-05

## 2020-09-05 MED ORDER — FENTANYL CITRATE (PF) 50 MCG/ML IJ SOLN (WRAP)
INTRAMUSCULAR | Status: AC
Start: 2020-09-05 — End: ?
  Filled 2020-09-05: qty 2

## 2020-09-05 MED ORDER — ACETAMINOPHEN 500 MG PO TABS
ORAL_TABLET | ORAL | Status: AC
Start: 2020-09-05 — End: ?
  Filled 2020-09-05: qty 2

## 2020-09-05 MED ORDER — CELECOXIB 200 MG PO CAPS
ORAL_CAPSULE | ORAL | Status: AC
Start: 2020-09-05 — End: ?

## 2020-09-05 MED ORDER — MEPIVACAINE HCL (PF) 2 % IJ SOLN
INTRAMUSCULAR | Status: DC | PRN
Start: 2020-09-05 — End: 2020-09-05
  Administered 2020-09-05: 70 mg via INTRASPINAL

## 2020-09-05 MED ORDER — ACETAMINOPHEN 500 MG PO TABS
1000.0000 mg | ORAL_TABLET | Freq: Once | ORAL | Status: DC | PRN
Start: 2020-09-05 — End: 2020-09-05

## 2020-09-05 MED ORDER — KETOROLAC TROMETHAMINE 30 MG/ML IJ SOLN
15.0000 mg | Freq: Once | INTRAMUSCULAR | Status: DC
Start: 2020-09-05 — End: 2020-09-05

## 2020-09-05 SURGICAL SUPPLY — 105 items
ADHESIVE SKIN CLOSURE DERMABOND ADVANCED (Skin Closure) ×2
ADHESIVE SKIN CLOSURE DERMABOND ADVANCED .7 ML LIQUID APPLICATOR (Skin Closure) IMPLANT
ADHESIVE SKIN DERMABOND ADV (Skin Closure) ×2
APPLICATOR CHLORAPREP 26 ML 70% ISOPROPYL ALCOHOL 2% CHLORHEXIDINE (Applicator) ×2 IMPLANT
APPLICATOR PRP 70% ISPRP 2% CHG 26ML (Applicator) ×2
BANDAGE ACE NONSTERILE 6IN LF (Bandage) ×1
BANDAGE ACE STERILE 6IN LF (Bandage) ×1
BANDAGE COMPRESSION L5 YD X W4 IN ELASTIC CLIP CLOSURE STRETCH (Procedure Accessories) ×1 IMPLANT
BANDAGE ELASTIC L5 YD X W6 IN W/SELF-CLOSURE HOOK (Bandage) ×1 IMPLANT
BANDAGE MEDIUM ELASTIC MATRIX POLYESTER COTTON L5 YD X W6 IN (Bandage) ×1 IMPLANT
BANDAGE MEDLINE COMPRESSION L5 YD X W4 (Procedure Accessories) ×1
BANDAGE MEDLINE MEDIUM COMPRESSION L5 YD (Bandage) ×2
BASEPLATE TIBIAL D KNEE LEFT MEDIAL PERSONA TIVANIUM (Component) IMPLANT
BASEPLATE TIBIAL D KNEE LT MEDIAL (Component) ×1 IMPLANT
BDG ELSTC SFTWRP STL 4X5YD (Procedure Accessories) ×1
BEARING TIBIAL H8 MM KNEE LEFT MEDIAL PERSONA VIVACIT-E D (Insert) IMPLANT
BEARING TIBIAL H8 MM KNEE LT MEDIAL (Insert) ×1 IMPLANT
BLADE SAW RECIP 70X12.5X1MM (Blade) ×1
BLADE SAW SAGITTAL L70 MM X W13.8 MM X (Blade) ×1
BLADE SAW SAGITTAL STRYKER PERFORMANCE SERIES L70 MM X W13.8 MM X H1.2 (Blade) ×1 IMPLANT
BLADE SAW THK1 MM RECIPROCATE OFFSET 2 (Blade) ×1
BLADE SAW THK1 MM RECIPROCATE OFFSET 2 SIDE NA L70 MM X W12.5 MM (Blade) ×1 IMPLANT
BLD SAG 13X70X1 EA=1 (Blade) ×1
CEMENT BONE REFOBACIN (Cement) ×1 IMPLANT
CEMENT REFOBACIN BONE R 1X40 (Cement) ×1 IMPLANT
CHLORAPREP APLCTR ORANGE 26ML (Applicator) ×2
COMP PRSN PS TIB LM SZD (Component) ×1 IMPLANT
COMPONENT FEMORAL 1 KNEE LEFT MEDIAL PARTIAL PERSONA (Component) IMPLANT
COMPONENT FEMORAL 1 KNEE LT MEDIAL (Component) ×1 IMPLANT
CUFF TOURNIQUET CYLINDER L34 IN X W4 IN 2 PORT 1 BLADDER QUICK CONNECT (Procedure Accessories) IMPLANT
CUFF TOURNIQUET CYLINDRICAL L30 IN X W4 IN 2 PORT 1 BLADDER QUICK (Procedure Accessories) IMPLANT
CUFF TRNQT CYL CLR CUF 30X4IN LF STRL 2 (Procedure Accessories)
CUFF TRNQT CYL CLR CUF 34X4IN LF STRL 2 (Procedure Accessories)
DRAPE 3/4 SHEET FANFLD 52X76IN (Drape) ×1
DRAPE SURGICAL 3/4 SHEET FANFOLD L76 IN (Drape) ×1
DRAPE SURGICAL 3/4 SHEET FANFOLD L76 IN X W52 IN TIBURON (Drape) ×1 IMPLANT
DRESSING ADH PLSTR CTTN KDL TELFA 10X4IN (Dressing) ×1
DRESSING ISLAND TELFA 4X10 (Dressing) ×1
DRESSING WOUND TELFA L10 IN X W4 IN ADHESIVE NON WOVEN POLYESTER (Dressing) IMPLANT
ELECTRODE ADULT PATIENT RETURN L9 FT REM POLYHESIVE ACRYLIC FOAM (Procedure Accessories) ×1 IMPLANT
ELECTRODE PATIENT RETURN L9 FT VALLEYLAB (Procedure Accessories) ×1
FEM COMP 1 KN LT MEDIAL (Component) ×1 IMPLANT
GLOVE SURG BIOGEL LTX PF SZ7.5 (Glove) ×3
GLOVE SURGICAL 7 1/2 BIOGEL PI INDICATOR (Glove) ×1
GLOVE SURGICAL 7 1/2 BIOGEL PI INDICATOR UNDERGLOVE POWDER FREE SMOOTH (Glove) ×1 IMPLANT
GLOVE SURGICAL 7 1/2 BIOGEL SURGEONS (Glove) ×3
GLOVE SURGICAL 7 1/2 BIOGEL SURGEONS POWDER FREE BEAD CUFF TEXTURE (Glove) ×3 IMPLANT
GOWN SMART XLG (Gown) ×1
GOWN SURGICAL XL SMARTGOWN LEVEL 4 (Gown) ×1
GOWN SURGICAL XL SMARTGOWN LEVEL 4 BREATHABLE (Gown) ×1 IMPLANT
HOOD FLYTE PEELAWAY (Procedure Accessories) ×4
HOOD SURGEON ZIPPER PEEL AWAY LENS FLYTE (Procedure Accessories) ×4 IMPLANT
INSERT PRSN VIV-E LM SZD 8MM (Insert) ×1 IMPLANT
INSERTER PPK BEARING TIP (Instrument) ×1
KIT RM TURNOVER LF NS DISP (Kits) ×1
KIT ROOM TURNOVER NONSTERILE LATEX FREE DISPOSABLE (Kits) ×1 IMPLANT
KIT SURGICAL BASIN 2 FOAK (Procedure Accessories) ×1
KIT SURGICAL BASIN 2 FOAK MEDLINE INDUSTRIES, INC. (Procedure Accessories) ×1 IMPLANT
KIT TURNOVER IMVH (Kits) ×1
NEEDLE SPINAL BD OD18 GA L3 1/2 IN (Needles) ×1
NEEDLE SPINAL DISP 18GX3.5IN (Needles) ×1
NEEDLE SPINAL L3 1/2 IN REGULAR WALL QUINCKE TIP OD18 GA BD (Needles) ×1 IMPLANT
NOZZLE CEMENT MIXER FEMORAL (Ortho Supply) ×1
PAD ELECTROSRG GRND REM W CRD (Procedure Accessories) ×1
SCREW GD ZUK 33MM LF STRL HEX HD MIS (Ortho Supply) ×3
SCREW GD ZUK 48MM LF STRL HEX HD MIS (Ortho Supply) ×1
SCREW GUIDE 33MM ZUK HEXAGON HEAD MIS STERILE LATEX FREE (Ortho Supply) IMPLANT
SCREW GUIDE 48MM ZUK HEXAGON HEAD MIS STERILE LATEX FREE (Ortho Supply) IMPLANT
SCREW HD QUADSPAR MIS 33MM (Ortho Supply) ×3
SCREW HEX HEADED 33MM (Ortho Supply) IMPLANT
SCREW NXGN HEX HEADED 48 MM (Ortho Supply) ×1
SCREW NXGN MIS QUAD SPRNG 27MM (Ortho Supply) IMPLANT
SET DOUBLE BASIN (Procedure Accessories) ×1
SOL NACL .9% IRRIG 3000ML ARTH (Irrigation Solutions) ×1
SOL WATER STERILE 1000CC BTLE (Irrigation Solutions) ×2
SOLN IRRISEPT WOUND DEBRIDEMNT (Solution) ×1
SOLUTION IRRIGATION 0.9% SODIUM CHLORIDE (Irrigation Solutions) ×1 IMPLANT
STRAP POSITIONING L19 IN X W3.5 IN (Procedure Accessories) ×1
STRAP POSITIONING L19 IN X W3.5 IN STIRRUP SLIP RING TECLIN (Procedure Accessories) ×1 IMPLANT
STRAP STIRRUP W O RING (Procedure Accessories) ×1
SUTURE QUILL PPO2 45X45 (Suture) ×2 IMPLANT
SYRINGE 50 ML GRADUATE NONPYROGENIC DEHP (Syringes, Needles) ×1
SYRINGE 50 ML GRADUATE NONPYROGENIC DEHP FREE PVC FREE LOK MEDICAL (Syringes, Needles) ×1 IMPLANT
SYRINGE LUER LOK 50ML (Syringes, Needles) ×1
SYSTEM BONE CEMENT TOTAL MIXING KIT (Ortho Supply) ×1
SYSTEM BONE CEMENT TOTAL MIXING KIT BREAK NOZZLE FEMUR REVOLUTION (Ortho Supply) ×1 IMPLANT
SYSTEM WOUND IRRIGATION DEBRIDEMENT (Solution) ×1
SYSTEM WOUND IRRIGATION DEBRIDEMENT CLEANSING IRRISEPT 0.05% (Solution) ×1 IMPLANT
TEGADERM CLR ACRYLLIC 4.5X6.8 (Dressing) ×3 IMPLANT
TIP INSERTER PERSONA BEARING PARTIAL (Instrument) ×1
TIP INSERTER PERSONA BEARING PARTIAL KNEE SYSTEM (Instrument) IMPLANT
TOURNIQUET 30X4 BLUE (Procedure Accessories)
TOURNIQUET 34X4 PURPLE (Procedure Accessories)
TOURNIQUET 44X4 BLUE (Procedure Accessories) ×1
TOURNIQUET SRG CLR CUF 44X4IN LF STRL 2 (Procedure Accessories) ×1
TOURNIQUET SURGICAL NAVY BLUE 2 PORT 1 BLADDER TUBE LIMB PROTECTION (Procedure Accessories) IMPLANT
TOWEL L27 IN X W17 IN COTTON STANDARD (Procedure Accessories) ×4
TOWEL OR L27 IN X W17 IN STANDARD PREWASH DELINT ABSORBENT COTTON BLUE (Procedure Accessories) ×4 IMPLANT
TOWEL OR STERILE DISP 17X27IN (Procedure Accessories) ×4
TRAY SRG TOTAL KNEE IMVH (Pack) ×1
TRAY SURGICAL PROCEDURE CUSTOM TOTAL KNEE STERILE (Pack) ×1 IMPLANT
TRAY TOTAL KNEE MVH (Pack) ×1
UNDRGLOV SZ7.5 PI INDICATR BLU (Glove) ×1
WATER STERILE PLASTIC POUR BOTTLE 1000 (Irrigation Solutions) ×2
WATER STERILE PLASTIC POUR BOTTLE 1000 ML (Irrigation Solutions) ×2 IMPLANT

## 2020-09-05 NOTE — H&P (Signed)
The history and physical exam completed by internal medicine consultant for this patient has been reviewed.  There are no apparent interval changes to the patient's status.  A physical exam has been performed. The patient has equal excursion bilaterally with inspiration and regular pulses throughout. The patient appears ready to proceed with surgery. The patient has been given the opportunity to ask any additional questions regarding the proposed procedure.  The patient appears ready to proceed as planned. All questions were answered.    Itsel Opfer E Markiah Janeway, PA

## 2020-09-05 NOTE — PT Eval Note (Signed)
[facility]  [address]  [place] [address]  [phone]    Physical Therapy Evaluation    Patient: Stacy Coleman MRN: [id number]   Unit: [facility] MAIN OPERATING ROOM Bed: MVMAINOR/MVMAINOR    Time of Treatment: Time Calculation  PT Received On: 09/05/20  Start Time: 1140  Stop Time: 1235  Time Calculation (min): 55 min    Consult received for Stacy Coleman for PT evaluation and treatment.  Patient's medical condition is appropriate for Physical Therapy  intervention at this time.    History of Present Illness: Stacy Coleman is a 65 y.o. female admitted on 09/05/2020   Medical Diagnosis: Primary osteoarthritis of left knee [M17.12]  Precautions  Weight Bearing Status: LLE WBAT  Total Knee Replacement:  (L uni knee, Sershon, 09/05/20)  Precaution Instructions Given to Patient: Yes  Other Precautions: Plans to go home DOS  MD orders for Exercises: active and passive    Patient Active Problem List   Diagnosis   . Left Achilles tendinitis     Past Medical History:   Diagnosis Date   . Arthritis    . Asthma    . Bronchitis    . Environmental allergies    . H/O hyperlipidemia    . 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 Hypertension    . Pneumonia 01/2017   . Rosai-Dorfman disease 2012   . Sickle cell trait    . Sleep apnea     cpap   . Swelling     bilat ankle/feet   . Type 2 diabetes mellitus, controlled    . Urinary tract infection     h/o   . Vein disorder     varicose     Past Surgical History:   Procedure Laterality Date   . APPENDECTOMY (OPEN)      open   . CHOLECYSTECTOMY      open   . COLON SURGERY      bowel obstruction repair but no resection per pt   . COLONOSCOPY     . EGD     . HYSTERECTOMY      partial due to fibroids   . SHOULDER ARTHROSCOPY Left     x2   . SINUS SURGERY         Social History:  Prior Level of Function  Prior level of function: Independent with ADLs;Ambulates independently  Baseline Activity Level: Community ambulation  Driving: independent  Employment: Retired  DME Currently at Home:  [employer], [employer] (needs a FWW from [place])    Home Living Arrangements  Living Arrangements: Spouse/significant other  Type of Home: House  Home Layout: Multi-level (2 STE no rail, 12 to BR w/rail)  Bathroom Shower/Tub: Pension scheme manager: Raised  DME Currently at Home: [employer], [employer] (needs a FWW from [place])    ASSESSMENT/PLAN   Stacy Coleman is a 65 y.o. female admitted 09/05/2020.  Pt's functional mobility is impacted by:  decreased activity tolerance, decreased balance, decreased bed mobility, gait impairment, pain, ROM deficits, decreased strength, transfers  and weight bearing restrictions.  There are a few comorbidities or other factors that affect plan of care and require modification of task including: assistive device needed for mobility, recent surgery, has stairs to manage, home alone for a portion of the day and primary caregiver.  Standardized tests and exams incorporated into evaluation include AMPAC mobility.  Pt demonstrates a evolving clinical presentation due to recent surgery.  Pt would continue to benefit from PT to address these deficits and increase functional independence.  RN/clin tech notified of this session's outcome, including pt clearing PT.    Cleared PT. Ready for next level of care. Coach present.    Complexity Level Hx and Co  morbidites Examination Clinical Decision Making Clinical Presentation   Moderate   1-2 factors 3 or more   Several options Evolving, plan may alter     Goals  Goal Formulation: With patient/family  Time for Goal Acheivement: 5 visits  Goals: Select goal  Pt Will Go Supine To Sit: with stand by assist;Goal met (09/05/2020)  Pt Will Perform Sit to Stand: with stand by assist;Goal met (09/05/2020)  Pt Will Transfer to Toilet: with stand by assist;Goal met (09/05/2020)  Pt Will Transfer to Tub/Shower: with minimal assist;Goal met (09/05/2020)  Pt Will Ambulate: 151-200 feet;with stand by assist;Goal met (09/05/2020)  Pt Will Go Up / Down Stairs: 1  flight;with stand by assist;Goal met (09/05/2020)  Pt Will Perform Home Exer Program: with minimal assist;Goal met (09/05/2020)     Rehabilitation Potential  Prognosis: Good;With continued PT status post acute discharge      Plan  Risks/Benefits/POC Discussed with Pt/Family: With patient/family  Patient Goal: increased mobility to ex  Treatment/Interventions: Exercise;Gait training;Stair training;Neuromuscular re-education;Functional transfer training;LE strengthening/ROM;Endurance training;Bed mobility  The patient and family participate and agree to the Care Plan and Goals.      Additional patient/coach education provided per Education Tab.    Recommendation  Discharge Recommendation: Home with supervision;Home with outpatient PT  DME Recommended for Discharge: Front wheel walker (Gave to pt)    SUBJECTIVE    Patient is agreeable to participation in the therapy session.  Patient Goal  Patient Goal: increased mobility to ex    Pain Assessment  Pain Assessment: Numeric Scale (0-10)  Pain Score: 5-moderate pain  Pain Location: Knee  Pain Orientation: Left  Pain Descriptors: Discomfort  Pain Frequency: Increases with movement  Effect of Pain on Daily Activities: moderate  Patient's Stated Comfort Functional Goal: 5-moderate pain  Pain Intervention(s): Cold applied;Repositioned  Multiple Pain Sites: No     Functional Limits  Pain Level Best: 0  Pain Level Worst: 10    OBJECTIVE     Patient is seated in a cardiac chair with IV and telemetry, seen Day of Surgery.    Observation of patient:     Cognition/Neuro Status  Arousal/Alertness: Appropriate responses to stimuli  Attention Span: Appears intact  Orientation Level: Oriented X4  Memory: Appears intact  Following Commands: Follows one step commands without difficulty  Safety Awareness: minimal verbal instruction  Insights: Educated in Engineer, building services  Problem Solving: Assistance required to identify errors made  Behavior: cooperative    Vitals: 155/88 in  sitting.    Musculoskeletal Examination  Gross ROM  Right Upper Extremity ROM: within functional limits  Left Upper Extremity ROM: within functional limits  Right Lower Extremity ROM: within functional limits  Left Lower Extremity ROM:  (10-80 Knee AROM)                                  Sensation is intact     Gross Strength  Right Upper Extremity Strength: within functional limits  Left Upper Extremity Strength: within functional limits  Right Lower Extremity Strength: within functional limits  Left Lower Extremity Strength: 3-/5 (3-/5 Knee extension)  AM-PACT Inpatient Short Forms  Inpatient AM-PACT Performed? (PT): Basic Mobility Inpatient Short Form  AM-PACT "6 Clicks" Basic Mobility Inpatient Short Form  Turning Over in Bed: A little  Sitting Down On/Standing From Armchair: A little  Lying on Back to Sitting on Side of Bed: A little  Assist Moving to/from Bed to Chair: A little  Assist to Walk in Hospital Room: A little  Assist to Climb 3-5 Steps with Railing: A little  PT Basic Mobility Raw Score: 18  CMS 0-100% Score: 46.58%    Functional Mobility  Supine to Sit: Stand by Assist  Scooting to HOB: Stand by Assist  Scooting to EOB: Stand by Assist  Sit to Supine: Stand by Assist  Sit to Stand: Stand by Assist  Stand to Sit: Stand by Assist    Transfers  Bed to Chair: Stand by Assist  Chair to Bed: Stand by Assist  Stand Pivot Transfers: Stand by Assist  Device Used for Functional Transfer: front-wheeled walker  Balance  Balance: within functional limits     Therapeutic Intervention (40 Minutes):   Supine there exer per TKR protocol x 15 reps in bed. L Knee AROM: 10-80 degrees. Supine to sit SBA. Sit to stand SBA. Ambulated 160' x 2 RW SBA. Curb step with RW SBA. Climbed up/down 4 steps with railSPC SBA. Walk-in shower transfer CG x1. Toilet transfer SBA. Voided.  Dressing in sitting CG x1 while sitting in chair.    Locomotion  Ambulation: Stand by Assist;with front-wheeled walker  Pattern: L  decreased stance time  Weight Bearing Observed: Able to maintain weight bearing precautions  Curb: Stand by Assist;with front-wheeled walker  Stair Management: Stand by Assist;one rail L;step to pattern;forward;with cane  Number of Stairs: 8   PMP - Progressive Mobility Protocol   PMP Activity: Step 7 - Walks out of Room  Distance Walked (ft) (Step 6,7): 160 Feet (x 2)       Lower Body Dressing: patient performed with contact guard assist  Toilet Transfer technique: patient performed with stand-by assistance  Tub/Shower Transfer technique: patient performed with contact guard assist      Straight Leg Raise: 15 AA  Quad Sets: 15  Heelslides: 15 AA  Glute Sets: 15  Hip Abduction: 15  Knee AROM : L Knee AROM: 10-80  Knee AROM Short Arc Quad: 15 AA  Ankle Pumps: 15    Participation and Endurance  Participation Effort: good  Endurance: Tolerates 30+ min exercise without fatigue    Patient is seated in a bedside chair with call bell in reach  and all needs provided.     Lorenz Coaster, PT  09/05/2020  12:43 PM    Attention MD:   Thank you for allowing [place] to participate in the care of Stacy Coleman. Regulations from the [employer] for Medicare and Medicaid Services (CMS) require your review and approval of this plan of care.     Please cosign this note indicating you are in agreement with the PT Plan of Care so we may initiate the therapy treatment plan.

## 2020-09-05 NOTE — Plan of Care (Signed)
Patient has cleared PT, adequate for next level of skilled PT services.   Lorenz Coaster, PT 09/05/2020 12:46 PM

## 2020-09-05 NOTE — Anesthesia Procedure Notes (Signed)
Peripheral    Patient location during procedure: Pre-Op  Reason for block: Post-op pain management      Injection technique: Single Shot  Block Region: Adductor Canal/Mid-Thigh Femoral  Laterality: Left      Block at surgeon's request Yes    Start time: 09/05/2020 8:07 AM  End time: 09/05/2020 8:15 AM        Pre-procedure Checklist     Timeout Completed:  09/05/2020 8:07 AM              Additional Notes  Staffing  Anesthesiologist: Marny Lowenstein N  Performed by: Anesthesiologist Preanesthetic Checklist Completed: patient identified, surgical consent, pre-op evaluation, timeout performed, risks and benefits discussed, anesthesia consent given and correct site verified.    Peripheral Block: Adductor Canal Block  Patient monitoring: pulse oximetry, EKG, NIBP and nasal cannula O2  Patient position: supineSterile Technique: DuraPrep and sterile gloves  Procedures: ultrasound guided  Needle  Needle type: Stimuplex     Needle gauge: 22 G    Needle length: 4 in            Ultrasound Guided: LA spread visualized, Needle visualized, Image stored or printed and Relevant anatomy identified (nerve, vessels, muscle)    AssessmentIncremental injection: yes    Injection made incrementally with aspirations every 5 mL.    Injection Resistance: no  Paresthesia Pain: no    Blood Aspirated: No    no suspected intravascular injection  Block Outcome: patient tolerated procedure well, no complications, successful block and pain improved    Signed by: Vonzella Nipple, MDMD Date 09/05/2020   Time  8:26 AM

## 2020-09-05 NOTE — Anesthesia Procedure Notes (Signed)
Combined Spinal epidural    Patient location during procedure: pre-op  Reason for block: Primary anesthetic in the OR  Requested by Surgeon: Yes   Start time: 09/05/2020 7:53 AM  End time: 09/05/2020 8:05 AM    Staffing  Performed: anesthesiologist   Anesthesiologist: Vonzella Nipple, MD    Pre-procedure Checklist   Completed: patient identified, surgical consent, pre-op evaluation, timeout performed, risks and benefits discussed, monitors and equipment checked, anesthesia consent given and correct site  Timeout Completed:  09/05/2020 7:53 AM    CSE  Patient monitoring: Pulse oximetry, NIBP and Nasal cannula O2  Premedication: Meaningful Contact Maintained and Yes  Patient position: sitting  Skin Local: lidocaine 2%    Number of attempts: 1                    Successful attempt  Interspace: L3-4  Approach: midline      Needle Placement  Needle type: Touhy needle   Needle gauge: 17  Injection technique: LOR saline  CSF Return: No  Blood Return: No    Needle Gauge 25 G  CSF Return: Yes   Blood Return:No  Paresthesia Pain: no    Catheter Placement   Catheter type: side hole  Catheter size: 19 G  Catheter at skin depth: 12 cm  CSF Return: No  Blood Return: No      Pressure Paresthesia: no  No Catheter IV/SA Signs or Symptoms    Assessment   Sensory level: Bilateral  Patient tolerated procedure well: Yes  Block Outcome: no complications, successful block and patient tolerated procedure well

## 2020-09-05 NOTE — Discharge Instr - AVS First Page (Addendum)
S/P Arthrolasty, Knee, Media; Unicondylar Prosthesis- Left      Physical therapy 3x weekly per Total Joint Replacement protocol.   Continue with individual exercise program until follow-up.    weight bearing status with walker or two crutches, may transition to cane per therapy  Active exercises:  Abduction/Flexion/Heelslides  Passive exercises:  Abduction/Flexion/Heelslides    Remove waterproof dressing POD #10.  No lotions or creams on incision until follow up with Dr.Sershon    No dental work for 90 days after surgery.  Assess pain level every 2 to 4 hours and medicate accordingly. Contact MD for Rx refills.     Download the [employer] App to your smart phone or tablet.  Use the app to check in daily with the [facility].  If you have not received an Email from [employer], please contact the [employer].    You will need the email to complete the registration for the app.   If you need assistance with the app, contact the [facility]     Outpatient Physical Therapy:      Please arrive 30 minutes early, take Photo ID, prescription, and insurance cards to first appointment    [facility] to notifiy patient of followup appointment with Dr. Patsy Lager.  [facility] [phone]      [facility] Hip and Knee Replacement Discharge Instructions    SWELLING  It is normal to have some swelling in your lower legs after surgery.  Walking every hour and doing your exercises will help strengthen your muscles and resolve the swelling.  If you have swelling, we recommend that you lie down every two hours, elevate your legs with pillows, and apply ice for 15 minutes.  If the swelling does not go away overnight, please call your doctors office.    SHOWERS  You are permitted to shower at home as long as your incision is clean and intact.  If you have a clear plastic dressing, leave that in place for 10 days and just let the water run over the dressing. If you have a cloth or gauze  dressing, remove it before the shower and replace it after the shower.  Do not soak in a tub or swim until you doctor says it's okay.    ACTIVITY  You will be using an assistive device ( walker, crutches or cane). Your physical therapist will help you with this. Most patients are able to get in and out of bed, use the restroom and go up and down stairs when they go home. It's a good idea to have someone with you in case you need help for the first week. We recommend that you get up and move around every 1-1.5 hours while you are awake. This will help with stiffness and reduce the chance of blood clots.    PAIN  You may continue to have pain or soreness for several weeks after surgery.  Gradually taper the frequency and amount of narcotic pain medication you are taking based on the amount of pain or soreness you have.  Extra strength acetaminophen can be used but should not exceed 4000mg  in one day.  Apply ice to the incision for 15 minutes after activity to help ease soreness.    MEDICATIONS  Most patients are discharged with several prescriptions. There are 2 main categories, pain pills and pills to help prevent blood clots.  Pain pills - take these if you need them. When taking narcotic pain medications, be sure  to drink plenty of water and use stool softeners, such as Pericolace or Colace, as these pain medications can be constipating.  Blood Thinners - could be aspirin, coumadin, lovenox or mechanical pumps. Make sure you take the medicine properly or use the pumps as prescribed.  Vitron C - this is an over the counter medication that will help to build back your blood count after surgery. We recommend this for most patients.    CALLING THE OFFICE  Call the doctor if you have any additional questions or concerns. After hours one of the fellows that helped with the surgery will return your call. During working hours the staff can answer simple questions. If you have concerns not addressed to your satisfaction by  the staff then ask for your doctor's nurse to return your call.

## 2020-09-05 NOTE — Discharge Instructions (Signed)
Anesthesia: Monitored Anesthesia Care (MAC)    You’re going to have surgery. During surgery, you’ll be given medicine called anesthesia. This will keep you comfortable and pain-free. Your surgeon will use monitored anesthesia care (MAC). This sheet tells you more about this type of anesthesia.  What is monitored anesthesia care?  MAC keeps you very drowsy during surgery. You may be awake, but you likely won't remember much. And you won’t feel pain. With MAC, medicines are given through an IV (intravenous) line into a vein in your arm or hand. A local anesthetic will usually be injected into the skin and muscle around the surgical site to numb it. The anesthesia provider monitors you during the procedure. They check your heart rate and rhythm, blood pressure, and blood oxygen level.  Anesthesia tools and medicines that may be near you during your procedure  You'll likely have:  · A pulse oximeter on the end of your finger. This measures your blood oxygen level.  · Electrocardiography leads (electrodes) on your chest. These record your heart rate and rhythm.  · Medicines given through an IV. These relax you and prevent pain. You may be awake or sleep lightly. If you have local anesthetic, it's injected directly into your skin.  · A face mask to give you oxygen. This may be done if needed.  Possible risks  MAC has some risks. These include:  · Breathing problems  · Upset stomach (nausea) and vomiting  · Allergic reaction to the anesthetic   Anesthesia safety  Tips for anesthesia safety include:   · Follow all instructions you're given for not eating or drinking before your procedure.  · Be sure your healthcare provider knows what medicines you take, especially any anti-inflammatory medicine or blood thinners. This includes aspirin and any other over-the-counter medicines, vitamins, herbs, and supplements. It also includes prescription medicines.  · Have an adult family member or friend drive you home after the  procedure.  · For the first 24 hours after your surgery:  ? Don't drive or use heavy equipment.  ? Don't make important decisions or sign documents.  ? Don't drink alcohol.  ? Have someone stay with you, if possible. They can watch for problems and help keep you safe.  [employer] last reviewed this educational content on 05/23/2020    © 2000-2021 [employer]. All rights reserved. This information is not intended as a substitute for professional medical care. Always follow your healthcare professional's instructions.      GOING HOME AFTER A SPINAL or EPIDURAL    For your surgery, an anesthesiologist placed a spinal or epidural anesthetic to make you numb at the site of the operation.      IMPORTANT INFORMATION:    It is normal for your legs to feel numb or weak.  This will gradually disappear within a day.    For 24 hours after surgery:  Ask for help when getting up and moving around to avoid falling.  Do not drive or operate potentially dangerous machinery.   Protect your legs from pressure, heat, and cold.    Call the Department of Anesthesia if:  Full strength and sensation do not return to your legs within 24 hours  You experience significant back pain at the site of the spinal/epidural injection  You develop a severe headache, especially when sitting up or standing  You have difficulty urinating     Remember to take your oral pain medication as instructed.     A healthcare provider will call to   check on you the day after your procedure.  Please let the provider know if you have any questions or concerns at that time.    IF YOU HAVE QUESTIONS, PLEASE CALL THE HOSPITAL'S  DEPARTMENT OF ANESTHESIA WHERE YOU HAD YOUR SURGERY OR PROCEDURE:    [facility] Department of Anesthesia:  [phone]  7am - 5pm weekdays and [phone] all other times    [facility] Department of Anesthesia:  [phone] 7am - 3pm weekdays, all other times [phone](or if unable to reach staff on  x [phone])    [facility] Department of Anesthesia: [phone] 7am - 4:00pm,  other times [phone], request to speak to an anesthesiologist.  If unavailable contact your physician.    GOING HOME WITH A NERVE BLOCK     Your anesthesiologist has placed a nerve block to reduce pain following your surgery. Local anesthetic ("numbing medication") has been injected to numb the nerves that supply the site of your procedure. A healthcare provider will call to check on you the day after your procedure.  If you have any questions or concerns at that time, let them know.    WHAT TO EXPECT  It is normal for your arm/leg to feel numb or weak.  This will gradually disappear over 12 - 24 hours.     TAKE YOUR PAIN MEDICATIONS  Even if you have no pain, remember to take your prescribed pain medications before the nerve block wears off.  Take the first dose 10 hours after your surgery to prevent a sudden onset of discomfort.    IF YOU HAD SURGERY ON YOUR SHOULDER, ARM OR HAND -  Wear your sling or brace to support entire arm and wrist.  You may experience the following sensations on the same side of the body that the block was placed, this will slowly disappear as the block wears off.  Droopy eye or stuffy nose  Feeling that you cannot take a deep breath  Hoarseness or a weak voice    IF YOU HAD SURGERY ON YOUR LEG OR FOOT -   Wear your leg brace as directed by your surgeon, secure the brace before you get up.   Ask for help when getting up and moving around to avoid falling.  Use crutches or a walker when ambulating at home (if prescribed)    REMEMBER - SAFETY FIRST!!  Do not drive or operate potentially dangerous machinery for 24 hours after surgery.   Protect your arm/leg from pressure, heat, and cold.  If you notice any of these symptoms, immediately call the Department of Anesthesia (see number below):   Inability to move the limb that was operated on the following day  Pain with inability to move the  limb  Fingers or toes that have changed from their normal color      IF YOU HAVE QUESTIONS, PLEASE CALL THE DEPARTMENT OF ANESTHESIA WHERE YOU HAD YOUR SURGERY OR PROCEDURE:     • [facility] Department of Anesthesia:  [phone]  7am - 5pm weekdays and [phone] all other times    • [facility] Department of Anesthesia:  [phone] 7am - 3pm weekdays, all other times [phone](or if unable to reach staff on x [phone])    • [facility] Department of Anesthesia: [phone] 7am - 4:00pm,  other times [phone], request to speak to an anesthesiologist.  If   unavailable contact your physician.

## 2020-09-05 NOTE — Anesthesia Postprocedure Evaluation (Signed)
Anesthesia Post Evaluation    Patient: Stacy Coleman    Procedures performed: Procedure(s):  ARTHROPLASTY, KNEE,  MEDIAL UNICONDYLAR PROSTHESIS    Anesthesia type: CSE    Patient location:PACU    Last vitals:   Vitals Value Taken Time   BP 160/77 09/05/20 1244   Temp 36.6 C (97.9 F) 09/05/20 1014   Pulse 80 09/05/20 1244   Resp 18 09/05/20 1244   SpO2 96 % 09/05/20 1244       Post pain: Patient not complaining of pain, continue current therapy      Mental Status:awake    Respiratory Function: tolerating room air    Cardiovascular: stable    Nausea/Vomiting: patient not complaining of nausea or vomiting    Hydration Status: adequate    Post assessment: no apparent anesthetic complications, no reportable events, and no evidence of recall            Signed by: Vonzella Nipple, MD MD 09/05/2020  2:54 PM

## 2020-09-05 NOTE — Progress Notes (Signed)
POCT glucose 157

## 2020-09-05 NOTE — Brief Op Note (Signed)
BRIEF OP NOTE    Date Time: 09/05/20 9:51 AM    Patient Name:   Stacy Coleman    Date of Operation:   09/05/2020    Providers Performing:   Surgeon(s):  [place] Elana Alm, MD  McDermott, Grayland Ormond, PA  Christophe Louis, MD      Operative Procedure:   left medial unicompartmental knee arthroplasty    Preoperative Diagnosis:   left knee osteoarthritis    Postoperative Diagnosis:   Same    Anesthesia:   Spinal / Epidural    Estimated Blood Loss:   See op report    Implants:     Implant Name Type Inv. Item Serial No. Manufacturer Lot No. LRB No. Used Action   CEMENT REFOBACIN BONE R 1X40 - [id number] Cement CEMENT REFOBACIN BONE R 1X40  [employer] [id number] Left 1 Implanted   partial femur    [employer]ET [id number] Left 1 Implanted   COMP PRSN PS TIB LM SZD - [id number] Component COMP PRSN PS TIB LM SZD  [employer]ET [id number] Left 1 Implanted   INSERT PRSN VIV-E LM SZD - [id number] Insert INSERT PRSN VIV-E LM SZD  [employer]ET [id number] Left 1 Implanted       Drains:   None    Specimens:   None.    Findings:    Full thickness articular cartilage loss.      Complications:   None.    Post-Op Instructions:   PT: WBAT on operative extremity.   DVT prophylaxis: ASA 81 bid  Wound closure:  Absorbable sutures.    Signed by: Christophe Louis, MD, MD                                                                              [facility]

## 2020-09-05 NOTE — Progress Notes (Signed)
Fall Precautions interventions include the following due to sedation/anesthesia for procedures:    -Physical environment is free of clutter  -The patient is oriented to the environment  -Family members may be at the bedside when appropriate  -Stretchers are in the lowest position with wheels locked  -Non-skid socks are provided as foot covers  -Staff members assist the patient with ambulation to the bathroom  -Call bell in reach if staff member is away

## 2020-09-05 NOTE — Transfer of Care (Signed)
Anesthesia Transfer of Care Note    Patient: Stacy Coleman    Procedures performed: Procedure(s):  ARTHROPLASTY, KNEE,  MEDIAL UNICONDYLAR PROSTHESIS    Anesthesia type: Combined Spinal/Epidural    Patient location:Phase I PACU    Last vitals:   Vitals:    09/05/20 1014   BP: 125/68   Pulse: 87   Resp: 19   Temp: 36.6 C (97.9 F)   SpO2: 99%       Post pain: Patient not complaining of pain, continue current therapy      Mental Status:awake and alert     Respiratory Function: tolerating face mask    Cardiovascular: stable    Nausea/Vomiting: patient not complaining of nausea or vomiting    Hydration Status: adequate    Post assessment: no apparent anesthetic complications and no reportable events    Signed by: Einar Crow, CRNA  09/05/20 10:18 AM

## 2020-09-05 NOTE — Op Note (Signed)
Operative Report    Date Time: 09/05/20 3:07 PM  Patient Name: Stacy Coleman  Attending Physician: No att. providers found      Date of Operation:   09/05/2020    Providers Performing:   Surgeon(s):  Christophe Louis, MD  Lamonte Sakai, MD  McDermott, Grayland Ormond, PA  Cherrie Gauze., PA    Preoperative Diagnosis:   Severe left knee degenerative joint disease    Postoperative Diagnosis:   Severe left knee degenerative joint disease    Operative Procedure:   left Medial Unicompartmental Knee Arthroplasty    Anesthesia:   Combined spinal epidural  Regional block    Tourniquet Time:   39 min    Estimated Blood Loss:   25 mL    Fluids:   1000 mL     Specimens:   None.    Complications:   None. Stable upon transfer to PACU.    Implants Utilized:     Implant Name Type Inv. Item Serial No. Manufacturer Lot No. LRB No. Used Action   CEMENT REFOBACIN BONE R 1X40 - [id number] Cement CEMENT REFOBACIN BONE R 1X40  [employer]ET [id number] Left 1 Implanted   partial femur    [employer]ET [id number] Left 1 Implanted   COMP PRSN PS TIB LM SZD - [id number] Component COMP PRSN PS TIB LM SZD  [employer]ET [id number] Left 1 Implanted   INSERT PRSN VIV-E LM SZD - [id number] Insert INSERT PRSN VIV-E LM SZD  [employer]ET [id number] Left 1 Implanted         Indications:   This is a 65 y.o.-year-old patient with a long history of severe left knee pain secondary to degenerative joint disease.  After review of the appropriate imaging studies, a detailed physical examination and history, and failure of non-operative management, the patient was found to be an excellent candidate for a medial unicompartmental knee arthroplasty.     The pertinent risks and benefits of a medial unicompartmental knee arthroplasty have been discussed with the patient including, but not limited to: infection, wound complication, bleeding, possibility of transfusion, fracture, instability and/or dislocation, neurovascular injury, leg length inequality,  dissatisfaction with surgery, venothromboembolism, medical complication and even death.  The patient expressed their understanding of the risks and benefits of the procedure, and they are in agreement with the plan. They have elected to proceed with surgery.      Operative Notes:   The patient was brought to the operating room suite.  Prior to initiation of the operative procedure, a surgical time-out was performed.  The patient's name, medical record, number, date-of-birth, and operative side were verified with the surgical team and consent form.  Additionally, it was verified that the appropriate peri-operative antibiotic administration was completed, radiographs were displayed, and the correct operative instrumentation and implants were available.    After adequate anesthesia was obtained, the patient was placed in the supine position on the operating table.  A tourniquet applied to the operative thigh.  The lower extremity was prepped and draped in standard surgical fashion.     Attention was turned to the operative knee where the landmarks of the patella and tibial tubercle were identified.  A standard midline incision was performed and carried down to the retinaculum.  Hemostasis was achieved, and a mini medial parapatellar arthrotomy was performed.  We encountered synovial fluid upon entering the joint, and the knee was found to show significant osteoarthritic changes isolated to the medial compartment.  A  subperiosteal medial release and capsular exposure was performed at the joint line.  The anterior horn of the medial meniscus was excised.  Medial osteophytes were removed. The lateral and patellofemoral compartments, ACL, PCL, and lateral meniscus were carefully inspected and noted to be appropriate for a medial unicompartmental knee arthroplasty.     Attention was turned to the tibia. The extramedullary guide was placed in proper rotation, slope and alignment.  An appropriate resection was completed using  an oscillating saw and reciprocating saw.  The tibial cut was removed without difficulty.  A spacer block was inserted in extension, and the tibial resection was noted to be adequate.  The distal femoral cutting guide was secured, and the distal femur was resected.     A complete medial meniscectomy was then completed.  Alignment and stability were checked using an extension/flexion spacer block.  Appropriate ligament releases were performed to balance the knee in extension.  Once excellent stability and alignment were achieved in extension, the spacer block was removed and the knee was flexed.  The femur was sized. The appropriate femoral cutting block was placed in its desired position, posterior to the tidemark and without overhanging edges.  The bone cuts were completed with an oscillating saw, and the pieces were removed.  Posterior osteophytes were removed with an osteotome, and the posterior capsular adhesions were released.  We then checked the overall balance of the knee in flexion and extension with the appropriate spacer block.  Once stability and alignment were achieved, the tibia was sized based on maximal bone coverage.    The femoral, tibial, and polyethylene trial components were placed.  A trial range of motion was performed, achieving full extension and greater than 120 degrees of flexion.  The knee was stable to varus/valgus stress in both flexion and extension. At this point, excellent stability and ROM were achieved and all trial components were removed.  The tibial was then prepped in the previously determined position.      Cement was mixed on the back table.  After the knee was thoroughly irrigated out with pulse lavage and dried, the cement was pressurized into the tibia and femur.  The final components were impacted into placed, excess cement was removed, and the cement was pressurized with the trail insert in place.  Axial compression in extension was placed as the cement was allowed to  cure.  After the cement was hard, the knee was inspected for any residual cement and this was removed.  The tourniquet was let down and meticulous hemostasis was achieved.  The knee was copiously irrigated with 1 liter saline.  Following this, the final polyethylene was inserted and locked into the locking mechanism.  The knee was closed in layers.  The arthrotomy was closed with #1 vicryl and Quill suture, the subcutaneous tissue with buried interrupted 2-0 monocryl, and the skin was closed running 4-0 monocryl and skin glue adhesive.  A water-resistent dressing was then applied.     The patient was transferred from the operative table to their hospital bed and then to the post-anesthesia care unit without complication.    The first-assistant was critical to all steps of the operation, including retraction, leg stabilization during exposure and bone preparation, and deep and superficial wound closure.  I performed and/or supervised the key portions of this surgical procedure, including evaluation of the bone cuts, reshaping of the bony elements, and insertion of the provisional and final components.      POST-OPERATIVE:    -  Weight bearing as tolerated on operative extremity.   - ROM as tolerated with PT  - Twenty-four hours of post-operative antibiotics.    - Pharmacologic DVT prophylaxis for one month, with mechanical prophylaxis while in the hospital.  - Advance diet as tolerated.  - Multimodal pain control.    Signed by: Nelta Numbers, MD, MD

## 2020-09-15 ENCOUNTER — Encounter: Payer: Self-pay | Admitting: Orthopaedic Surgery

## 2021-08-05 ENCOUNTER — Ambulatory Visit (INDEPENDENT_AMBULATORY_CARE_PROVIDER_SITE_OTHER): Payer: Medicare Other | Admitting: Podiatry

## 2021-08-05 ENCOUNTER — Other Ambulatory Visit: Payer: Self-pay

## 2021-08-05 DIAGNOSIS — E119 Type 2 diabetes mellitus without complications: Secondary | ICD-10-CM

## 2021-08-05 NOTE — Progress Notes (Signed)
° °  HPI: 65 y.o. female presenting today as a new patient for evaluation of a diabetic foot exam.  Patient states that she recently moved from [place] and she used to routinely see a podiatrist every 3 months.  She presents today to become established with the practice and for diabetic foot evaluation.  She currently has no pain associated to her feet and no complaints other than her bilateral great toes that do have some ingrowing toenails.  She says when she debrides them they feel much better.  No past medical history on file.   Physical Exam: General: The patient is alert and oriented x3 in no acute distress.  Dermatology: Skin is warm, dry and supple bilateral lower extremities. Negative for open lesions or macerations.  There is an incurvated nail noted to bilateral great toes.  Vascular: Palpable pedal pulses bilaterally. No edema or erythema noted. Capillary refill within normal limits.  Neurological: Epicritic and protective threshold grossly intact bilaterally.   Musculoskeletal Exam: Range of motion within normal limits to all pedal and ankle joints bilateral. Muscle strength 5/5 in all groups bilateral.   Radiographic Exam:  Normal osseous mineralization. Joint spaces preserved. No fracture/dislocation/boney destruction.    Assessment: 1.  Encounter for routine diabetic foot exam 2.  Hyperkeratosis of skin bilateral weightbearing surfaces of the feet   Plan of Care:  1. Patient evaluated.  Comprehensive diabetic foot exam performed today 2.  Mechanical debridement of the bilateral great toenails was performed using a nail nipper without incident or bleeding to alleviate pressure from the ingrown toenails 3.  Urea 40% lotion provided at checkout.  Apply daily 4.  Return to clinic in 3 months for routine foot care with one of our routine foot care physicians      Edrick Kins, DPM [facility]  Dr. Edrick Kins, DPM    [address], [place] [address]                Office [phone]  Fax [phone]

## 2021-09-03 ENCOUNTER — Telehealth: Payer: Self-pay | Admitting: Oncology

## 2021-09-03 NOTE — Telephone Encounter (Signed)
Attempted to contact patient to scheduled per referral message 1/12. Left voicemail for patient to call back to schedule.

## 2021-10-14 ENCOUNTER — Other Ambulatory Visit: Payer: Self-pay

## 2021-10-14 ENCOUNTER — Inpatient Hospital Stay: Payer: Medicare Other | Attending: Oncology | Admitting: Oncology

## 2021-10-14 VITALS — BP 134/80 | HR 73 | Temp 98.1°F | Resp 19 | Ht 63.0 in | Wt 230.0 lb

## 2021-10-14 DIAGNOSIS — D763 Other histiocytosis syndromes: Secondary | ICD-10-CM

## 2021-10-14 NOTE — Progress Notes (Signed)
[facility] New Patient Consult   Requesting MD: Faustino Congress, [place],  [place] [address]   Aliese Brannum 66 y.o.  [birth year]    Reason for Consult: Rosai-Dorfman syndrome   HPI: Ms. Burnette was diagnosed with extranodal Rosai-Dorfman syndrome in 2009 upon resection of a left thigh subcutaneous mass.  She had multiple additional subcutaneous and soft tissue masses excised showing recurrent Rosai-Dorfman syndrome.  The pathology was reviewed at the [facility] confirming a diagnosis of Dorfman. A PET scan in October 2013 revealed numerous FDG avid subdermal/subcutaneous soft tissue nodules in addition to foci of increased metabolism in the axial and appendicular skeleton. She was treated with prednisone from October 13 through February 2014 with initial response, but developed a minor relapse on taper.  She was treated with 4 cycles of weekly rituximab in 2014 followed by every 77-month maintenance rituximab with continued response. She was evaluated at [facility] in January 2015.  Rituximab maintenance was recommended in the absence of enlargement of lesions.  She continue care  with Dr.Verma in [place] and pleated 2 years of rituximab maintenance in January 2016.  She had progressive disease in 2019 and was treated with rituximab in July 2019 with no response.  She had no response to [place].  She had partial remission with Revlimid/Decadron beginning August 2019.  She was last seen in follow-up at [facility] in July 2022.  She was in clinical remission with a "recent "negative PET.  Ms. Estrin relocated to [facility] in October 2022.  She feels well.  She notes a single lesion at the left thigh that has been present for many months.  No other skin lesions.   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  Rotator cuff surgery 7.  Sinus surgery Medications: Reviewed  Allergies:  Allergies  Allergen Reactions   Codeine    Simvastatin     Unknown reaction per pt    Latex Hives and Rash    Family history: Noncontributory  Social History:   She lives with her husband in [place].  She is retired Pharmacist, hospital.  She does not smoke cigarettes.  Social alcohol use.  No transfusion history.  No risk factor for HIV or hepatitis.  She has received COVID-19 and influenza vaccines  ROS:   Positives include: Hyperpigmented lesion at the left thigh, allergy symptoms  A complete ROS was otherwise negative.  Physical Exam:  Blood pressure 134/80, pulse 73, temperature 98.1 F (36.7 C), temperature source Oral, resp. rate 19, height 5\' 3"  (1.6 m), weight 230 lb (104.3 kg), SpO2 100 %.  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 Heagle has a history of Rosai-Dorfman disease involving cutaneous nodules and bone lesions.  She is asymptomatic from the Rosai-Dorfman at present.  It appears she last received systemic therapy (Revlimid/Decadron) in August 2019.  There is no clinical evidence of active disease at present aside from the lesion of the left thigh.  I recommend continued observation.  We will obtain a restaging PET if she develops additional cutaneous lesions or other evidence of disease progression.  She will return for an office visit in 6 months.  She will call in the interim for new symptoms.  Betsy Coder, MD  10/14/2021, 5:24 PM

## 2021-11-03 ENCOUNTER — Other Ambulatory Visit: Payer: Self-pay

## 2021-11-03 ENCOUNTER — Encounter: Payer: Self-pay | Admitting: Podiatry

## 2021-11-03 ENCOUNTER — Ambulatory Visit (INDEPENDENT_AMBULATORY_CARE_PROVIDER_SITE_OTHER): Payer: Medicare Other | Admitting: Podiatry

## 2021-11-03 DIAGNOSIS — M2010 Hallux valgus (acquired), unspecified foot: Secondary | ICD-10-CM | POA: Diagnosis not present

## 2021-11-03 DIAGNOSIS — M79674 Pain in right toe(s): Secondary | ICD-10-CM | POA: Diagnosis not present

## 2021-11-03 DIAGNOSIS — M79675 Pain in left toe(s): Secondary | ICD-10-CM | POA: Diagnosis not present

## 2021-11-03 DIAGNOSIS — B351 Tinea unguium: Secondary | ICD-10-CM | POA: Diagnosis not present

## 2021-11-03 NOTE — Progress Notes (Signed)
This patient returns to my office for at risk foot care.  This patient requires this care by a professional since this patient will be at risk due to having  diabetes.  This patient is unable to cut nails herself since the patient cannot reach her nails.These nails are painful walking and wearing shoes.  This patient presents for at risk foot care today. ? ?General Appearance  Alert, conversant and in no acute stress. ? ?Vascular  Dorsalis pedis and posterior tibial  pulses are palpable  bilaterally.  Capillary return is within normal limits  bilaterally. Temperature is within normal limits  bilaterally. ? ?Neurologic  Senn-Weinstein monofilament wire test within normal limits  bilaterally. Muscle power within normal limits bilaterally. ? ?Nails Thick disfigured discolored nails with subungual debris  hallux nails bilaterally. No evidence of bacterial infection or drainage bilaterally. Marked incurvation medial and lateral borders both great toes. ? ?Orthopedic  No limitations of motion  feet .  No crepitus or effusions noted.  No bony pathology or digital deformities noted. HAV  B/L.  Plantar flexed fifth metatarsal left foot. ? ?Skin  normotropic skin with no porokeratosis noted bilaterally.  No signs of infections or ulcers noted.   Asymptomatic  callus sub 5th left foot. ? ?Onychomycosis  Pain in right toes  Pain in left toes ? ?Consent was obtained for treatment procedures.   Mechanical debridement of nails 1-5  bilaterally performed with a nail nipper.  Filed with dremel without incident.  Discussed possible surgery for correction of foot pathology.  Told her to make an appointment with Dr.  Amalia Hailey or Posey Pronto. ? ? ?Return office visit    3 months                  Told patient to return for periodic foot care and evaluation due to potential at risk complications. ? ? ?Gardiner Barefoot DPM  . ?

## 2021-11-11 ENCOUNTER — Ambulatory Visit (INDEPENDENT_AMBULATORY_CARE_PROVIDER_SITE_OTHER): Payer: Medicare Other | Admitting: Podiatry

## 2021-11-11 ENCOUNTER — Other Ambulatory Visit: Payer: Self-pay

## 2021-11-11 ENCOUNTER — Telehealth: Payer: Self-pay | Admitting: *Deleted

## 2021-11-11 DIAGNOSIS — L600 Ingrowing nail: Secondary | ICD-10-CM

## 2021-11-11 DIAGNOSIS — R52 Pain, unspecified: Secondary | ICD-10-CM

## 2021-11-11 MED ORDER — GENTAMICIN SULFATE 0.1 % EX CREA: 1.0000 "application " | TOPICAL_CREAM | Freq: Two times a day (BID) | CUTANEOUS | 1 refills | Status: DC

## 2021-11-11 NOTE — Progress Notes (Signed)
? ?  Subjective: ?Patient presents today for evaluation of pain to the medial and lateral border bilateral great toes. Patient is concerned for possible ingrown nail.  It is very sensitive to touch.  Patient presents today for further treatment and evaluation. ? ?No past medical history on file. ? ?Objective:  ?General: Well developed, nourished, in no acute distress, alert and oriented x3  ? ?Dermatology: Skin is warm, dry and supple bilateral.  Medial and lateral border bilateral great toes appears to be erythematous with evidence of an ingrowing nail. Pain on palpation noted to the border of the nail fold. The remaining nails appear unremarkable at this time. There are no open sores, lesions. ? ?Vascular: Dorsalis Pedis artery and Posterior Tibial artery pedal pulses palpable. No lower extremity edema noted.  ? ?Neruologic: Grossly intact via light touch bilateral. ? ?Musculoskeletal: Muscular strength within normal limits in all groups bilateral. Normal range of motion noted to all pedal and ankle joints.  ? ?Assesement: ?#1 Paronychia with ingrowing nail medial and lateral border bilateral great toes ?#2 Pain in toe ? ?Plan of Care:  ?1. Patient evaluated.  ?2. Discussed treatment alternatives and plan of care. Explained nail avulsion procedure and post procedure course to patient. ?3. Patient opted for permanent partial nail avulsion of the ingrown portion of the nail.  ?4. Prior to procedure, local anesthesia infiltration utilized using 3 ml of a 50:50 mixture of 2% plain lidocaine and 0.5% plain marcaine in a normal hallux block fashion and a betadine prep performed.  ?5. Partial permanent nail avulsion with chemical matrixectomy performed using [id number] applications of phenol followed by alcohol flush.  ?6. Light dressing applied.  Post care instructions provided ?7.  Prescription for gentamicin 2% cream  ?8.  Return to clinic 2 weeks. ? 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  ? ?Edrick Kins, DPM ?[place] ? ?Dr. Edrick Kins, DPM  ?  ?[address]                                       ?[place], [place] [address]                ?Office [phone]  ?Fax [phone] ? ? ? ? ?

## 2021-11-11 NOTE — Telephone Encounter (Signed)
Patient is calling for the soaking instructions for her toe, procedure today, please call or email to her, did not received after visit today. ?Tried calling patient to go over the instructions over the phone, no answer, left vmessage to call back. ?

## 2021-11-12 NOTE — Telephone Encounter (Signed)
Called patient and gave after care instructions for care of toe after ingrown procedure, she verbalized understanding. ?

## 2021-11-20 ENCOUNTER — Telehealth: Payer: Self-pay | Admitting: *Deleted

## 2021-11-20 NOTE — Telephone Encounter (Signed)
Patient is calling because she may need an antibiotic or a stronger ointment. She had ingrown 2 weeks ago and the toes are still bleeding , swollen and painful. Please advise. ?

## 2021-11-21 ENCOUNTER — Other Ambulatory Visit: Payer: Self-pay | Admitting: Podiatry

## 2021-11-21 MED ORDER — DOXYCYCLINE HYCLATE 100 MG PO TABS: 100.0000 mg | ORAL_TABLET | Freq: Two times a day (BID) | ORAL | 0 refills | Status: DC

## 2021-11-21 NOTE — Progress Notes (Signed)
Postop total permanent nail avulsion B/L great toes ?

## 2021-11-21 NOTE — Telephone Encounter (Signed)
Doxycycline sent to [facility] on [address]. - Dr. Amalia Hailey

## 2021-11-25 ENCOUNTER — Ambulatory Visit (INDEPENDENT_AMBULATORY_CARE_PROVIDER_SITE_OTHER): Payer: Medicare Other | Admitting: Podiatry

## 2021-11-25 DIAGNOSIS — L600 Ingrowing nail: Secondary | ICD-10-CM

## 2021-11-30 ENCOUNTER — Telehealth: Payer: Self-pay | Admitting: Podiatry

## 2021-11-30 NOTE — Telephone Encounter (Signed)
Patient called and stated that she is still having issues with both great toes. Patient stated that its still some swelling. Its dark around the cuticle area with some itching/burning. ? ?Please advise  ?

## 2021-12-09 NOTE — Progress Notes (Signed)
? ?  Subjective: ?66 y.o. female presents today status post permanent nail avulsion procedure of the medial and lateral border of the bilateral great toes that was performed on 11/11/2021.  Patient states that she continues to have some pain and tenderness associated to her feet.  She has been soaking her feet and applying the antibiotic cream as instructed.  No new complaints at this time.  ? ?No past medical history on file. ? ?Objective: ?Skin is warm, dry and supple. Nail and respective nail fold appears to be healing appropriately. Open wound to the associated nail fold with a granular wound base and moderate amount of fibrotic tissue. Minimal drainage noted. Mild erythema around the periungual region likely due to phenol chemical matricectomy. ? ?Assessment: ?#1 s/p partial permanent nail matrixectomy medial and lateral border bilateral great toes ? ? ?Plan of care: ?#1 patient was evaluated  ?#2 light debridement of open wound was performed to the periungual border of the respective toe using a currette. Antibiotic ointment and Band-Aid was applied. ?#3  Urea 40% lotion was provided for the patient for daily moisturizer to the forefoot  ?#4 I would like to see the patient 1 more time for ingrown follow-up since she continues to have some pain and tenderness.  Return to clinic 2 weeks ? ? ?Edrick Kins, DPM ?[place] ? ?Dr. Edrick Kins, DPM  ?  ?[address]                                      ?[place], [place] [address]                ?Office [phone]  ?Fax [phone] ? ? ? ? ?

## 2021-12-14 ENCOUNTER — Ambulatory Visit (INDEPENDENT_AMBULATORY_CARE_PROVIDER_SITE_OTHER): Payer: Medicare Other | Admitting: Podiatry

## 2021-12-14 DIAGNOSIS — L600 Ingrowing nail: Secondary | ICD-10-CM | POA: Diagnosis not present

## 2021-12-14 NOTE — Patient Instructions (Signed)
Dr. Maureen Ralphs -orthopedic surgeon for knees ?[facility] -back pathology ?

## 2021-12-14 NOTE — Progress Notes (Signed)
? ?  Subjective: ?66 y.o. female presents today status post permanent nail avulsion procedure of the medial and lateral border of the bilateral great toes that was performed on 11/11/2021. patient doing well.  She does have some sensitivity and tenderness to the toes. She has been soaking and applying triple antibiotic ointment daily ? ?No past medical history on file. ? ?Objective: ?Skin is warm, dry and supple. Nail and respective nail fold appears to be healing appropriately. Open wound to the associated nail fold with a granular wound base and moderate amount of fibrotic tissue. Minimal drainage noted. Mild erythema around the periungual region likely due to phenol chemical matricectomy. ? ?Assessment: ?#1 s/p partial permanent nail matrixectomy medial and lateral border bilateral great toes ? ? ?Plan of care: ?#1 patient was evaluated  ?#2 light debridement of open wound was performed to the periungual border of the respective toe using a currette. Antibiotic ointment and Band-Aid was applied. ?#3 patient is to return to clinic on a PRN basis. ? ? ?Edrick Kins, DPM ?[place] ? ?Dr. Edrick Kins, DPM  ?  ?[address]                                      ?[place], [place] [address]                ?Office [phone]  ?Fax [phone] ? ? ? ? ?

## 2021-12-29 ENCOUNTER — Other Ambulatory Visit: Payer: Self-pay | Admitting: Student

## 2021-12-29 DIAGNOSIS — M5416 Radiculopathy, lumbar region: Secondary | ICD-10-CM

## 2022-01-10 ENCOUNTER — Ambulatory Visit
Admission: RE | Admit: 2022-01-10 | Discharge: 2022-01-10 | Disposition: A | Discharge status: Home / Self Care | Payer: Medicare Other | Source: Ambulatory Visit | Attending: Student | Admitting: Student

## 2022-01-10 DIAGNOSIS — M5416 Radiculopathy, lumbar region: Secondary | ICD-10-CM

## 2022-01-28 ENCOUNTER — Inpatient Hospital Stay: Payer: Medicare Other | Attending: Oncology | Admitting: Oncology

## 2022-01-28 VITALS — BP 144/93 | HR 74 | Temp 98.2°F | Resp 20 | Ht 63.0 in | Wt 234.0 lb

## 2022-01-28 DIAGNOSIS — R2242 Localized swelling, mass and lump, left lower limb: Secondary | ICD-10-CM | POA: Diagnosis not present

## 2022-01-28 DIAGNOSIS — E119 Type 2 diabetes mellitus without complications: Secondary | ICD-10-CM | POA: Diagnosis not present

## 2022-01-28 DIAGNOSIS — E785 Hyperlipidemia, unspecified: Secondary | ICD-10-CM | POA: Diagnosis not present

## 2022-01-28 DIAGNOSIS — I10 Essential (primary) hypertension: Secondary | ICD-10-CM | POA: Diagnosis not present

## 2022-01-28 DIAGNOSIS — D763 Other histiocytosis syndromes: Secondary | ICD-10-CM | POA: Insufficient documentation

## 2022-01-28 DIAGNOSIS — J45909 Unspecified asthma, uncomplicated: Secondary | ICD-10-CM | POA: Insufficient documentation

## 2022-01-28 NOTE — Progress Notes (Signed)
  [place] OFFICE PROGRESS NOTE   Diagnosis: Rosai-Dorfman syndrome  INTERVAL HISTORY:   [name] returns prior to scheduled visit.  A cutaneous lesion at the left anterior thigh has enlarged.  There may be a smaller lesion at the left lower leg.  She reports discomfort and tenderness in the neck for the past several weeks.  No fever, night sweats, or anorexia.  Objective:  Vital signs in last 24 hours:  Blood pressure (!) 144/93, pulse 74, temperature 98.2 F (36.8 C), temperature source Oral, resp. rate 20, height '5\' 3"'$  (1.6 m), weight 234 lb (106.1 kg).    HEENT: Neck without mass, tender over the left sternocleidomastoid musculature with slight fullness compared to the right side.  No discrete mass. Lymphatics: No cervical, supraclavicular, axillary, or inguinal nodes. Resp: Lungs clear bilaterally Cardio: Regular rate and rhythm GI: No hepatosplenomegaly Vascular: No leg edema  Skin: Bilateral axillary hidradenitis, one-point 5-2 to centimeter raised dark brown cutaneous lesion at the left anterior thigh.  Similar less than 1 cm lesion at the lateral left lower leg.  Possible early similar lesion at left lateral abdominal wall   Medications: I have reviewed the patient's current medications.   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 PET October 2013-numerous FDG avid subdermal/subcutaneous nodules and bone lesions Prednisone October 2013 with initial response, tapered until February 2014 when skin lesions enlarged Rituximab 4 weekly doses beginning, followed by rituximab maintenance with completion of 2 years January 2016 Disease progression July 2019-no response to rituximab or Gleevec Revlimid/Decadron August 2019-partial response 01/28/2022-enlarging cutaneous lesion left anterior thigh, possible small similar lesions at the left abdomen and left lower  leg Diabetes Hypertension Hyperlipidemia Asthma/seasonal allergies     Disposition: Shari Hill has a history of Rosai-Dorfman syndrome.  She has been maintained off of systemic therapy after completing treatment with Revlimid/Decadron in August 2019.  She now has an enlarging cutaneous nodule at the left anterior thigh.  There are additional smaller suspicious skin lesions.  The neck discomfort could be related to progression of the Rosai-Dorfman.  We will refer her to Dr. Lucianne Lei Denvernter at [facility] to consider the indication for a repeat biopsy, staging evaluation (lesions have been confirmed on PET in the past), and treatment options.  She will return as scheduled in 2 months.  I am available to see her sooner as needed.  Betsy Coder, MD  01/28/2022  8:37 AM

## 2022-01-29 ENCOUNTER — Encounter: Payer: Self-pay | Admitting: *Deleted

## 2022-01-29 NOTE — Progress Notes (Signed)
  Referral placed to Dr Katina Dung at [facility] for evaluation of Shari Hill   Records faxed on 01/28/2022 to [phone]

## 2022-02-03 ENCOUNTER — Ambulatory Visit (INDEPENDENT_AMBULATORY_CARE_PROVIDER_SITE_OTHER): Payer: Medicare Other | Admitting: Podiatry

## 2022-02-03 DIAGNOSIS — L600 Ingrowing nail: Secondary | ICD-10-CM

## 2022-02-10 ENCOUNTER — Ambulatory Visit: Payer: Medicare Other | Admitting: Podiatry

## 2022-03-11 ENCOUNTER — Other Ambulatory Visit: Payer: Self-pay | Admitting: *Deleted

## 2022-03-11 ENCOUNTER — Encounter: Payer: Self-pay | Admitting: *Deleted

## 2022-03-11 DIAGNOSIS — C7B00 Secondary carcinoid tumors, unspecified site: Secondary | ICD-10-CM

## 2022-03-11 DIAGNOSIS — D763 Other histiocytosis syndromes: Secondary | ICD-10-CM

## 2022-03-11 NOTE — Progress Notes (Signed)
PATIENT NAVIGATOR PROGRESS NOTE  Name: Shari Hill Date: 03/11/2022 MRN: [id number]  DOB: 1955/09/12   Reason for visit:  F/U after [facility] appt with Dr Garnette Scheuermann  Comments:  [name] called this am in regard to recommendation from Dr Garnette Scheuermann for PET scan, she would like to have it done locally, Discussed with Dr Benay Spice and have ordered PET scan to be scheduled.  Will call patient when scheduled    Time spent counseling/coordinating care: 45-60 minutes

## 2022-03-19 ENCOUNTER — Encounter (HOSPITAL_COMMUNITY)
Admission: RE | Admit: 2022-03-19 | Discharge: 2022-03-19 | Disposition: A | Discharge status: Home / Self Care | Payer: Medicare Other | Source: Ambulatory Visit | Attending: Oncology | Admitting: Oncology

## 2022-03-19 DIAGNOSIS — I700 Atherosclerosis of aorta: Secondary | ICD-10-CM | POA: Insufficient documentation

## 2022-03-19 DIAGNOSIS — C7B00 Secondary carcinoid tumors, unspecified site: Secondary | ICD-10-CM | POA: Insufficient documentation

## 2022-03-19 DIAGNOSIS — E049 Nontoxic goiter, unspecified: Secondary | ICD-10-CM | POA: Diagnosis not present

## 2022-03-19 DIAGNOSIS — K760 Fatty (change of) liver, not elsewhere classified: Secondary | ICD-10-CM | POA: Diagnosis not present

## 2022-03-19 DIAGNOSIS — D763 Other histiocytosis syndromes: Secondary | ICD-10-CM | POA: Insufficient documentation

## 2022-03-19 DIAGNOSIS — R911 Solitary pulmonary nodule: Secondary | ICD-10-CM | POA: Insufficient documentation

## 2022-03-19 LAB — GLUCOSE, CAPILLARY: Glucose-Capillary: 168 mg/dL — ABNORMAL HIGH (ref 70–99)

## 2022-03-19 MED ORDER — FLUDEOXYGLUCOSE F - 18 (FDG) INJECTION
11.6000 | Freq: Once | INTRAVENOUS | Status: AC
  Administered 2022-03-19: 11.6 via INTRAVENOUS

## 2022-03-25 ENCOUNTER — Telehealth: Payer: Self-pay | Admitting: *Deleted

## 2022-04-13 ENCOUNTER — Inpatient Hospital Stay: Payer: Medicare Other | Attending: Oncology | Admitting: Oncology

## 2022-04-13 VITALS — BP 133/70 | HR 74 | Temp 98.1°F | Resp 18 | Ht 63.0 in | Wt 235.0 lb

## 2022-04-13 DIAGNOSIS — R911 Solitary pulmonary nodule: Secondary | ICD-10-CM | POA: Diagnosis not present

## 2022-04-13 DIAGNOSIS — R590 Localized enlarged lymph nodes: Secondary | ICD-10-CM | POA: Diagnosis not present

## 2022-04-13 DIAGNOSIS — E119 Type 2 diabetes mellitus without complications: Secondary | ICD-10-CM | POA: Insufficient documentation

## 2022-04-13 DIAGNOSIS — E785 Hyperlipidemia, unspecified: Secondary | ICD-10-CM | POA: Insufficient documentation

## 2022-04-13 DIAGNOSIS — J45909 Unspecified asthma, uncomplicated: Secondary | ICD-10-CM | POA: Insufficient documentation

## 2022-04-13 DIAGNOSIS — D763 Other histiocytosis syndromes: Secondary | ICD-10-CM | POA: Insufficient documentation

## 2022-04-13 DIAGNOSIS — I10 Essential (primary) hypertension: Secondary | ICD-10-CM | POA: Diagnosis not present

## 2022-04-13 NOTE — Progress Notes (Signed)
  [place] OFFICE PROGRESS NOTE   Diagnosis: Rosai-Dorfman syndrome  INTERVAL HISTORY:   Shari Hill returns as scheduled.  She feels a lesion at the left anterior thigh is raised.  No new lesions.  She complains of soreness at the throat following drainage of a "cyst "prior to moving to [place].  She saw Dr. Garnette Scheuermann in July.  She discussed options with him by telephone last week.  He recommends observation.  Objective:  Vital signs in last 24 hours:  Blood pressure 133/70, pulse 74, temperature 98.1 F (36.7 C), temperature source Oral, resp. rate 18, height '5\' 3"'$  (1.6 m), weight 235 lb (106.6 kg), SpO2 100 %.    HEENT: Oropharynx without visible mass, neck without mass.  Tenderness in the submandibular area and anterior neck bilaterally Lymphatics: No cervical, supraclavicular, axillary, or inguinal nodes Resp: Lungs clear bilaterally Cardio: Regular rhythm GI: No hepatosplenomegaly Vascular: No leg edema  Skin: 1 cm cutaneous cystic lesion near the xiphoid, 2-2.5 cm raised dark/purple lesion at the left anterior thigh   Medications: I have reviewed the patient's current medications.   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 PET October 2013-numerous FDG avid subdermal/subcutaneous nodules and bone lesions Prednisone October 2013 with initial response, tapered until February 2014 when skin lesions enlarged Rituximab 4 weekly doses beginning, followed by rituximab maintenance with completion of 2 years January 2016 Disease progression July 2019-no response to rituximab or Gleevec Revlimid/Decadron August 2019-partial response 01/28/2022-enlarging cutaneous lesion left anterior thigh, possible small similar lesions at the left abdomen and left lower leg PET 03/11/2022-heterogenous mildly enlarged right thyroid with calcification, cutaneous left anterior thigh lesion with moderate metabolic activity,  cutaneous lesion at the mid anterior chest-nonspecific, left upper lobe pulmonary nodule with low-level FDG activity less than mediastinal blood pool-nonspecific Diabetes Hypertension Hyperlipidemia Asthma/seasonal allergies Left lung nodule on PET 03/11/2022      Disposition: [name] appears stable.  I reviewed the restaging PET findings and images with her.  She appears to have a single site of cutaneous involvement with Rosai-Dorfman.  I do not recommend initiating systemic therapy unless she develops discomfort related to the current lesion or new lesions.  We will refer her for a chest CT in 3 months to follow-up on the left lung nodule.  I will refer her to ENT to evaluate the throat soreness.  She will be scheduled for thyroid ultrasound.  Ms. Lohmeyer will return for an office visit in 3 months.  Betsy Coder, MD  04/13/2022  12:11 PM

## 2022-04-30 ENCOUNTER — Other Ambulatory Visit (HOSPITAL_BASED_OUTPATIENT_CLINIC_OR_DEPARTMENT_OTHER): Payer: Medicare Other

## 2022-05-14 ENCOUNTER — Other Ambulatory Visit: Payer: Self-pay | Admitting: Oncology

## 2022-05-14 ENCOUNTER — Ambulatory Visit (HOSPITAL_BASED_OUTPATIENT_CLINIC_OR_DEPARTMENT_OTHER)
Admission: RE | Admit: 2022-05-14 | Discharge: 2022-05-14 | Disposition: A | Discharge status: Home / Self Care | Payer: Medicare Other | Source: Ambulatory Visit | Attending: Oncology | Admitting: Oncology

## 2022-05-14 DIAGNOSIS — D763 Other histiocytosis syndromes: Secondary | ICD-10-CM

## 2022-06-03 ENCOUNTER — Telehealth: Payer: Self-pay | Admitting: *Deleted

## 2022-06-03 NOTE — Telephone Encounter (Signed)
-----   Message from Ladell Pier, MD sent at 06/02/2022  3:54 PM EDT ----- Please call patient, the thyroid ultrasound reveals multiple nodules consistent with multinodular goiter, 2 of the nodules meet criteria to consider an FNA biopsy, we can proceed with referral for a needle biopsy if she agrees

## 2022-06-03 NOTE — Telephone Encounter (Signed)
Notified Shari Hill of thyroid US results and that two nodules meet criteria to do needle biopsy if she would like to proceed. She sees her ENT, Dr. Constance Holster next week and will discuss with him and get back with [place].

## 2022-06-11 ENCOUNTER — Other Ambulatory Visit: Payer: Self-pay | Admitting: Otolaryngology

## 2022-06-11 DIAGNOSIS — E042 Nontoxic multinodular goiter: Secondary | ICD-10-CM

## 2022-07-12 ENCOUNTER — Ambulatory Visit (HOSPITAL_BASED_OUTPATIENT_CLINIC_OR_DEPARTMENT_OTHER)
Admission: RE | Admit: 2022-07-12 | Discharge: 2022-07-12 | Disposition: A | Discharge status: Home / Self Care | Payer: Medicare Other | Source: Ambulatory Visit | Attending: Oncology | Admitting: Oncology

## 2022-07-12 ENCOUNTER — Encounter (HOSPITAL_BASED_OUTPATIENT_CLINIC_OR_DEPARTMENT_OTHER): Payer: Self-pay

## 2022-07-12 DIAGNOSIS — R911 Solitary pulmonary nodule: Secondary | ICD-10-CM | POA: Diagnosis present

## 2022-07-12 DIAGNOSIS — D763 Other histiocytosis syndromes: Secondary | ICD-10-CM | POA: Insufficient documentation

## 2022-07-14 ENCOUNTER — Other Ambulatory Visit: Payer: Self-pay | Admitting: *Deleted

## 2022-07-14 ENCOUNTER — Inpatient Hospital Stay: Payer: Medicare Other | Attending: Oncology | Admitting: Oncology

## 2022-07-14 VITALS — BP 142/79 | HR 84 | Temp 98.1°F | Resp 18 | Ht 63.0 in | Wt 236.0 lb

## 2022-07-14 DIAGNOSIS — D763 Other histiocytosis syndromes: Secondary | ICD-10-CM | POA: Insufficient documentation

## 2022-07-14 DIAGNOSIS — R911 Solitary pulmonary nodule: Secondary | ICD-10-CM | POA: Diagnosis not present

## 2022-07-14 DIAGNOSIS — I10 Essential (primary) hypertension: Secondary | ICD-10-CM | POA: Diagnosis not present

## 2022-07-14 DIAGNOSIS — E785 Hyperlipidemia, unspecified: Secondary | ICD-10-CM | POA: Insufficient documentation

## 2022-07-14 DIAGNOSIS — E119 Type 2 diabetes mellitus without complications: Secondary | ICD-10-CM | POA: Insufficient documentation

## 2022-07-14 DIAGNOSIS — J45909 Unspecified asthma, uncomplicated: Secondary | ICD-10-CM | POA: Diagnosis not present

## 2022-07-14 DIAGNOSIS — E041 Nontoxic single thyroid nodule: Secondary | ICD-10-CM | POA: Insufficient documentation

## 2022-07-14 NOTE — Progress Notes (Signed)
[place] OFFICE PROGRESS NOTE   Diagnosis: Rosai-Dorfman disease  INTERVAL HISTORY:   Shari Hill returns as scheduled.  She has low back pain and plans to see Dr. Saintclair Halsted in 2 weeks to discuss lumbar surgery.  She is scheduled for thyroid biopsy 08/03/2022.  She complains of malaise.  The lesion at the left thigh has increased in size and is painful.  She is concerned there may be a new lesion beneath the right breast. She had a negative head and neck exam by Dr. Constance Holster on 06/10/2022. Objective:  Vital signs in last 24 hours:  Blood pressure (!) 142/79, pulse 84, temperature 98.1 F (36.7 C), temperature source Oral, resp. rate 18, height '5\' 3"'$  (1.6 m), weight 236 lb (107 kg), SpO2 98 %.    HEENT: Neck without mass Lymphatics: No cervical, supraclavicular, axillary, or inguinal nodes.  Bilateral axillary hidradenitis. Resp: Lungs clear bilaterally Cardio: Regular rate and rhythm GI: No hepatosplenomegaly Vascular: No leg edema  Skin: Cystic lesion measuring 1.5 cm at the mid anterior chest, similar 1 cm cystic lesion inferior to the right breast 2 cm nodular lesion at the left mid anterior thigh  Imaging:  CT Chest Wo Contrast  Result Date: 07/12/2022 CLINICAL DATA:  Follow-up lung nodules history of Rosai Dorfman * Tracking Code: BO * EXAM: CT CHEST WITHOUT CONTRAST TECHNIQUE: Multidetector CT imaging of the chest was performed following the standard protocol without IV contrast. RADIATION DOSE REDUCTION: This exam was performed according to the departmental dose-optimization program which includes automated exposure control, adjustment of the mA and/or kV according to patient size and/or use of iterative reconstruction technique. COMPARISON:  PET-CT March 19, 2022. FINDINGS: Cardiovascular: Aortic atherosclerosis. Normal size heart. No significant pericardial effusion/thickening. Mediastinum/Nodes: Heterogeneous nodular enlargement of the thyroid gland previously  evaluated by ultrasound on May 14, 2022. No pathologically enlarged mediastinal, hilar or axillary lymph nodes, noting limited sensitivity for the detection of hilar adenopathy on this noncontrast study. The esophagus is grossly unremarkable. Lungs/Pleura: Unchanged size of the solid left upper lobe pulmonary nodule which measures 9 x 8 mm on image 39/4 and was minimally metabolic on prior PET-CT. No new suspicious pulmonary nodules or masses. No pleural effusion. No pneumothorax. Upper Abdomen: Hepatic steatosis. Musculoskeletal: Multilevel degenerative changes spine. Stable cutaneous nodule in the midline of the chest measuring 12 mm on image 88/2. IMPRESSION: 1. Unchanged size of the solid 9 x 8 mm left upper lobe pulmonary nodule which was minimally metabolic on prior PET-CT. Suggest follow-up chest CT in 6-12 months to assess stability. 2. No new suspicious pulmonary nodules or masses. 3. Stable 12 mm cutaneous nodule in the midline chest. 4. Hepatic steatosis. 5. Heterogeneous nodular enlargement of the thyroid gland previously evaluated by ultrasound on May 14, 2022. 6.  Aortic Atherosclerosis (ICD10-I70.0). Electronically Signed   By: Dahlia Bailiff M.D.   On: 07/12/2022 09:14    Medications: I have reviewed the patient's current medications.   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 PET October 2013-numerous FDG avid subdermal/subcutaneous nodules and bone lesions Prednisone October 2013 with initial response, tapered until February 2014 when skin lesions enlarged Rituximab 4 weekly doses beginning, followed by rituximab maintenance with completion of 2 years January 2016 Disease progression July 2019-no response to rituximab or Gleevec Revlimid/Decadron August 2019-partial response 01/28/2022-enlarging cutaneous lesion left anterior thigh, possible small similar lesions at the left abdomen and left lower leg PET  03/11/2022-heterogenous mildly enlarged right thyroid with calcification, cutaneous  left anterior thigh lesion with moderate metabolic activity, cutaneous lesion at the mid anterior chest-nonspecific, left upper lobe pulmonary nodule with low-level FDG activity less than mediastinal blood pool-nonspecific Diabetes Hypertension Hyperlipidemia Asthma/seasonal allergies Left lung nodule on PET 03/11/2022 CT chest 07/12/2022-unchanged left upper lobe nodule, no new suspicious nodules or masses heterogenous enlargement of the thyroid 7.  Multinodular thyroid-2 right-sided nodules meet criteria for biopsy and a calcified nodule in the right mid thyroid meets criteria for surveillance imaging    Disposition: Shari Hill has Rosai-Dorfman disease.  She remains off of specific therapy.  The thigh lesion appears unchanged on exam today.  However the lesion is more uncomfortable.  I see no new lesions.  The skin lesions at the anterior chest and inferior right breast appear to be an inclusion cyst.  She will undergo a thyroid biopsy in a few weeks.  She is seeing Dr. Saintclair Halsted to consider lumbar spine surgery.  Shari Hill would like to consider beginning systemic therapy for treatment of the Rosai-Dorfman disease.  We can consider treatment with low-dose thalidomide or radiation for treatment of the symptomatic thigh lesion.  She will return for an office visit after undergoing the thyroid biopsy and consultation with Dr. Saintclair Halsted.  Shari Coder, MD  07/14/2022  10:32 AM

## 2022-07-27 ENCOUNTER — Other Ambulatory Visit: Payer: Self-pay | Admitting: Neurosurgery

## 2022-08-03 ENCOUNTER — Ambulatory Visit
Admission: RE | Admit: 2022-08-03 | Discharge: 2022-08-03 | Disposition: A | Discharge status: Home / Self Care | Payer: Medicare Other | Source: Ambulatory Visit | Attending: Otolaryngology | Admitting: Otolaryngology

## 2022-08-03 ENCOUNTER — Other Ambulatory Visit (HOSPITAL_COMMUNITY)
Admission: RE | Admit: 2022-08-03 | Discharge: 2022-08-03 | Disposition: A | Discharge status: Home / Self Care | Payer: Medicare Other | Source: Ambulatory Visit | Attending: Otolaryngology | Admitting: Otolaryngology

## 2022-08-03 DIAGNOSIS — E042 Nontoxic multinodular goiter: Secondary | ICD-10-CM

## 2022-08-03 DIAGNOSIS — E041 Nontoxic single thyroid nodule: Secondary | ICD-10-CM | POA: Insufficient documentation

## 2022-08-05 LAB — CYTOLOGY - NON PAP

## 2022-08-20 ENCOUNTER — Encounter: Payer: Self-pay | Admitting: Oncology

## 2022-08-20 ENCOUNTER — Inpatient Hospital Stay: Payer: Medicare Other

## 2022-08-20 ENCOUNTER — Encounter (HOSPITAL_COMMUNITY): Payer: Self-pay

## 2022-08-20 ENCOUNTER — Inpatient Hospital Stay: Payer: Medicare Other | Attending: Oncology | Admitting: Oncology

## 2022-08-20 ENCOUNTER — Encounter: Payer: Self-pay | Admitting: *Deleted

## 2022-08-20 VITALS — BP 145/85 | HR 79 | Temp 98.1°F | Resp 18 | Ht 63.0 in | Wt 236.0 lb

## 2022-08-20 DIAGNOSIS — I10 Essential (primary) hypertension: Secondary | ICD-10-CM | POA: Diagnosis not present

## 2022-08-20 DIAGNOSIS — D763 Other histiocytosis syndromes: Secondary | ICD-10-CM | POA: Insufficient documentation

## 2022-08-20 DIAGNOSIS — E119 Type 2 diabetes mellitus without complications: Secondary | ICD-10-CM | POA: Diagnosis not present

## 2022-08-20 DIAGNOSIS — J45909 Unspecified asthma, uncomplicated: Secondary | ICD-10-CM | POA: Insufficient documentation

## 2022-08-20 DIAGNOSIS — E785 Hyperlipidemia, unspecified: Secondary | ICD-10-CM | POA: Insufficient documentation

## 2022-08-20 DIAGNOSIS — R911 Solitary pulmonary nodule: Secondary | ICD-10-CM | POA: Insufficient documentation

## 2022-08-20 LAB — CMP (CANCER CENTER ONLY)
ALT: 18 U/L (ref 0–44)
AST: 20 U/L (ref 15–41)
Albumin: 4.5 g/dL (ref 3.5–5.0)
Alkaline Phosphatase: 74 U/L (ref 38–126)
Anion gap: 10 (ref 5–15)
BUN: 17 mg/dL (ref 8–23)
CO2: 30 mmol/L (ref 22–32)
Calcium: 10.1 mg/dL (ref 8.9–10.3)
Chloride: 103 mmol/L (ref 98–111)
Creatinine: 1.17 mg/dL — ABNORMAL HIGH (ref 0.44–1.00)
GFR, Estimated: 51 mL/min — ABNORMAL LOW (ref >60)
Glucose, Bld: 163 mg/dL — ABNORMAL HIGH (ref 70–99)
Potassium: 3.6 mmol/L (ref 3.5–5.1)
Sodium: 143 mmol/L (ref 135–145)
Total Bilirubin: 0.4 mg/dL (ref 0.3–1.2)
Total Protein: 7.4 g/dL (ref 6.5–8.1)

## 2022-08-20 LAB — CBC WITH DIFFERENTIAL (CANCER CENTER ONLY)
Abs Immature Granulocytes: 0.03 10*3/uL (ref 0.00–0.07)
Basophils Absolute: 0.1 10*3/uL (ref 0.0–0.1)
Basophils Relative: 1 %
Eosinophils Absolute: 0.2 10*3/uL (ref 0.0–0.5)
Eosinophils Relative: 3 %
HCT: 43.7 % (ref 36.0–46.0)
Hemoglobin: 14.2 g/dL (ref 12.0–15.0)
Immature Granulocytes: 0 %
Lymphocytes Relative: 30 %
Lymphs Abs: 2.3 10*3/uL (ref 0.7–4.0)
MCH: 28 pg (ref 26.0–34.0)
MCHC: 32.5 g/dL (ref 30.0–36.0)
MCV: 86.2 fL (ref 80.0–100.0)
Monocytes Absolute: 0.6 10*3/uL (ref 0.1–1.0)
Monocytes Relative: 8 %
Neutro Abs: 4.4 10*3/uL (ref 1.7–7.7)
Neutrophils Relative %: 58 %
Platelet Count: 197 10*3/uL (ref 150–400)
RBC: 5.07 MIL/uL (ref 3.87–5.11)
RDW: 15.4 % (ref 11.5–15.5)
WBC Count: 7.6 10*3/uL (ref 4.0–10.5)
nRBC: 0 % (ref 0.0–0.2)

## 2022-08-20 LAB — LACTATE DEHYDROGENASE: LDH: 172 U/L (ref 98–192)

## 2022-08-20 NOTE — Progress Notes (Signed)
Left message w/Jessica at [facility] to reach out to Dr. Katina Dung to obtain his recommended dose for thalidomide that is planned to begin on 09/13/22. Per Dr. Benay Spice, she will not require steroids while on thalidomide. Mrs. Calaway notified.

## 2022-08-20 NOTE — Progress Notes (Signed)
[facility] OFFICE PROGRESS NOTE   Diagnosis: Rosai-Dorfman syndrome  INTERVAL HISTORY:   [name] returns as scheduled.  She has a persistent lesion at the left anterior thigh.  She has developed a new lesion at the left buttock.  She feels the lesion is due to Rosai-Dorfman.  The area is painful. [name] is scheduled for lumbar fusion surgery with Dr. Saintclair Halsted next week.  Objective:  Vital signs in last 24 hours:  Blood pressure (!) 145/85, pulse 79, temperature 98.1 F (36.7 C), temperature source Oral, resp. rate 18, height '5\' 3"'$  (1.6 m), weight 236 lb (107 kg), SpO2 98 %.     Lymphatics: Cervical, supraclavicular, axillary, or inguinal nodes Resp: Lungs clear bilaterally Cardio: Regular rate and rhythm GI: No hepatosplenomegaly Vascular: No leg edema  Skin: 2 cm nodular lesion at the left anterior thigh, similar appearing 3 cm lesion at the left buttock with associated tenderness and subcutaneous induration, cystic lesion at the mid anterior chest, 1 cm cystic lesion inferior to the right breast   Lab Results:  Lab Results  Component Value Date   WBC 7.6 08/20/2022   HGB 14.2 08/20/2022   HCT 43.7 08/20/2022   MCV 86.2 08/20/2022   PLT 197 08/20/2022   NEUTROABS 4.4 08/20/2022    CMP  Lab Results  Component Value Date   NA 143 08/20/2022   K 3.6 08/20/2022   CL 103 08/20/2022   CO2 30 08/20/2022   GLUCOSE 163 (H) 08/20/2022   BUN 17 08/20/2022   CREATININE 1.17 (H) 08/20/2022   CALCIUM 10.1 08/20/2022   PROT 7.4 08/20/2022   ALBUMIN 4.5 08/20/2022   AST 20 08/20/2022   ALT 18 08/20/2022   ALKPHOS 74 08/20/2022   BILITOT 0.4 08/20/2022   GFRNONAA 51 (L) 08/20/2022     Medications: I have reviewed the patient's current medications.   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 PET October 2013-numerous FDG avid subdermal/subcutaneous nodules and bone lesions Prednisone October 2013  with initial response, tapered until February 2014 when skin lesions enlarged Rituximab 4 weekly doses beginning, followed by rituximab maintenance with completion of 2 years January 2016 Disease progression July 2019-no response to rituximab or Gleevec Revlimid/Decadron August 2019-partial response 01/28/2022-enlarging cutaneous lesion left anterior thigh, possible small similar lesions at the left abdomen and left lower leg PET 03/11/2022-heterogenous mildly enlarged right thyroid with calcification, cutaneous left anterior thigh lesion with moderate metabolic activity, cutaneous lesion at the mid anterior chest-nonspecific, left upper lobe pulmonary nodule with low-level FDG activity less than mediastinal blood pool-nonspecific Diabetes Hypertension Hyperlipidemia Asthma/seasonal allergies Left lung nodule on PET 03/11/2022 CT chest 07/12/2022-unchanged left upper lobe nodule, no new suspicious nodules or masses heterogenous enlargement of the thyroid 7.  Multinodular thyroid-2 right-sided nodules meet criteria for biopsy and a calcified nodule in the right mid thyroid meets criteria for surveillance imaging    Disposition: [name] has Rosai-Dorfman syndrome.  She has been maintained off of specific therapy since 2019.  She has 2 apparent Rosai-Dorfman lesions on the left leg.  The lesions are painful/tender.  She would like to resume treatment.  She does not wish to receive radiation.  Dr. Katina Dung commended thalidomide or methotrexate.  I recommend thalidomide to begin after the upcoming spine surgery.  We discussed potential toxicities associated with thalidomide including the chance of hematologic toxicity, rash, neuropathy, somnolence, thromboembolism, and GI toxicity.  She was given reading materials on thalidomide.  The plan  is to begin thalidomide on 09/13/2021.  I will consult with Dr. Katina Dung to discuss dosing of the thalidomide.  She will return for an office visit  approximately 2 weeks after beginning thalidomide.  Betsy Coder, MD  08/20/2022  4:39 PM

## 2022-08-20 NOTE — Pre-Procedure Instructions (Signed)
Surgical Instructions    Your procedure is scheduled on Friday, January 5th.  Report to [facility] Main Entrance "A" at 11:00 A.M., then check in with the Admitting office.  Call this number if you have problems the morning of surgery:  [phone]  If you have any questions prior to your surgery date call [phone]: Open Monday-Friday 8am-4pm If you experience any cold or flu symptoms such as cough, fever, chills, shortness of breath, etc. between now and your scheduled surgery, please notify us at the above number.     Remember:  Do not eat or drink after midnight the night before your surgery     Take these medicines the morning of surgery with A SIP OF WATER  Azelastine HCl nasal spray   If needed: acetaminophen (TYLENOL)  albuterol (VENTOLIN HFA)- if needed, bring with you on day of surgery budesonide-formoterol (SYMBICORT)  methocarbamol (ROBAXIN)  Olopatadine HCl (PATADAY) eye drops  Follow your surgeon's instructions on when to stop Aspirin.  If no instructions were given by your surgeon then you will need to call the office to get those instructions.     As of today, STOP taking any Aleve, Naproxen, Ibuprofen, Motrin, Advil, Goody's, BC's, all herbal medications, fish oil, and all vitamins.   WHAT DO I DO ABOUT MY DIABETES MEDICATION?   Do not take glipiZIDE (GLUCOTROL XL) or SitaGLIPtin-MetFORMIN HCl (JANUMET XR) the morning of surgery.  HOLD empagliflozin (JARDIANCE) FOR 72 HOURS PRIOR TO SURGERY. Last dose will be 1/1.        HOW TO MANAGE YOUR DIABETES BEFORE AND AFTER SURGERY  Why is it important to control my blood sugar before and after surgery? Improving blood sugar levels before and after surgery helps healing and can limit problems. A way of improving blood sugar control is eating a healthy diet by:  Eating less sugar and carbohydrates  Increasing activity/exercise  Talking with your doctor about reaching your blood sugar goals High blood  sugars (greater than 180 mg/dL) can raise your risk of infections and slow your recovery, so you will need to focus on controlling your diabetes during the weeks before surgery. Make sure that the doctor who takes care of your diabetes knows about your planned surgery including the date and location.  How do I manage my blood sugar before surgery? Check your blood sugar at least 4 times a day, starting 2 days before surgery, to make sure that the level is not too high or low.  Check your blood sugar the morning of your surgery when you wake up and every 2 hours until you get to the Short Stay unit.  If your blood sugar is less than 70 mg/dL, you will need to treat for low blood sugar: Do not take insulin. Treat a low blood sugar (less than 70 mg/dL) with  cup of clear juice (cranberry or apple), 4 glucose tablets, OR glucose gel. Recheck blood sugar in 15 minutes after treatment (to make sure it is greater than 70 mg/dL). If your blood sugar is not greater than 70 mg/dL on recheck, call [phone] for further instructions. Report your blood sugar to the short stay nurse when you get to Short Stay.  If you are admitted to the hospital after surgery: Your blood sugar will be checked by the staff and you will probably be given insulin after surgery (instead of oral diabetes medicines) to make sure you have good blood sugar levels. The goal for blood sugar control after surgery is 80-180  mg/dL.                     Do NOT Smoke (Tobacco/Vaping) for 24 hours prior to your procedure.  If you use a CPAP at night, you may bring your mask/headgear for your overnight stay.   Contacts, glasses, piercing's, hearing aid's, dentures or partials may not be worn into surgery, please bring cases for these belongings.    For patients admitted to the hospital, discharge time will be determined by your treatment team.   Patients discharged the day of surgery will not be allowed to drive home, and someone  needs to stay with them for 24 hours.  SURGICAL WAITING ROOM VISITATION Patients having surgery or a procedure may have no more than 2 support people in the waiting area - these visitors may rotate.   Children under the age of 68 must have an adult with them who is not the patient. If the patient needs to stay at the hospital during part of their recovery, the visitor guidelines for inpatient rooms apply. Pre-op nurse will coordinate an appropriate time for 1 support person to accompany patient in pre-op.  This support person may not rotate.   Please refer to the [facility] website for the visitor guidelines for Inpatients (after your surgery is over and you are in a regular room).    Special instructions:   [facility]- Preparing For Surgery  Before surgery, you can play an important role. Because skin is not sterile, your skin needs to be as free of germs as possible. You can reduce the number of germs on your skin by washing with CHG (chlorahexidine gluconate) Soap before surgery.  CHG is an antiseptic cleaner which kills germs and bonds with the skin to continue killing germs even after washing.    Oral Hygiene is also important to reduce your risk of infection.  Remember - BRUSH YOUR TEETH THE MORNING OF SURGERY WITH YOUR REGULAR TOOTHPASTE  Please do not use if you have an allergy to CHG or antibacterial soaps. If your skin becomes reddened/irritated stop using the CHG.  Do not shave (including legs and underarms) for at least 48 hours prior to first CHG shower. It is OK to shave your face.  Please follow these instructions carefully.   Shower the NIGHT BEFORE SURGERY and the MORNING OF SURGERY  If you chose to wash your hair, wash your hair first as usual with your normal shampoo.  After you shampoo, rinse your hair and body thoroughly to remove the shampoo.  Use CHG Soap as you would any other liquid soap. You can apply CHG directly to the skin and wash gently with a scrungie or  a clean washcloth.   Apply the CHG Soap to your body ONLY FROM THE NECK DOWN.  Do not use on open wounds or open sores. Avoid contact with your eyes, ears, mouth and genitals (private parts). Wash Face and genitals (private parts)  with your normal soap.   Wash thoroughly, paying special attention to the area where your surgery will be performed.  Thoroughly rinse your body with warm water from the neck down.  DO NOT shower/wash with your normal soap after using and rinsing off the CHG Soap.  Pat yourself dry with a CLEAN TOWEL.  Wear CLEAN PAJAMAS to bed the night before surgery  Place CLEAN SHEETS on your bed the night before your surgery  DO NOT SLEEP WITH PETS.   Day of Surgery: Take a shower  with CHG soap. Do not wear jewelry or makeup Do not wear lotions, powders, perfumes, or deodorant. Do not shave 48 hours prior to surgery.  Do not bring valuables to the hospital. [facility] is not responsible for any belongings or valuables. Do not wear nail polish, gel polish, artificial nails, or any other type of covering on natural nails (fingers and toes) If you have artificial nails or gel coating that need to be removed by a nail salon, please have this removed prior to surgery. Artificial nails or gel coating may interfere with anesthesia's ability to adequately monitor your vital signs. Wear Clean/Comfortable clothing the morning of surgery Remember to brush your teeth WITH YOUR REGULAR TOOTHPASTE.   Please read over the following fact sheets that you were given.    If you received a COVID test during your pre-op visit  it is requested that you wear a mask when out in public, stay away from anyone that may not be feeling well and notify your surgeon if you develop symptoms. If you have been in contact with anyone that has tested positive in the last 10 days please notify you surgeon.

## 2022-08-24 ENCOUNTER — Encounter (HOSPITAL_COMMUNITY)
Admission: RE | Admit: 2022-08-24 | Discharge: 2022-08-24 | Disposition: A | Discharge status: Home / Self Care | Payer: Medicare Other | Source: Ambulatory Visit | Attending: Neurosurgery | Admitting: Neurosurgery

## 2022-08-24 ENCOUNTER — Other Ambulatory Visit: Payer: Self-pay

## 2022-08-24 ENCOUNTER — Encounter (HOSPITAL_COMMUNITY): Payer: Self-pay

## 2022-08-24 VITALS — BP 136/82 | HR 79 | Temp 97.5°F | Resp 18 | Ht 63.0 in | Wt 234.6 lb

## 2022-08-24 DIAGNOSIS — E139 Other specified diabetes mellitus without complications: Secondary | ICD-10-CM | POA: Diagnosis not present

## 2022-08-24 DIAGNOSIS — J45909 Unspecified asthma, uncomplicated: Secondary | ICD-10-CM | POA: Insufficient documentation

## 2022-08-24 DIAGNOSIS — E785 Hyperlipidemia, unspecified: Secondary | ICD-10-CM | POA: Insufficient documentation

## 2022-08-24 DIAGNOSIS — I10 Essential (primary) hypertension: Secondary | ICD-10-CM | POA: Diagnosis not present

## 2022-08-24 DIAGNOSIS — Z9049 Acquired absence of other specified parts of digestive tract: Secondary | ICD-10-CM | POA: Diagnosis not present

## 2022-08-24 DIAGNOSIS — R911 Solitary pulmonary nodule: Secondary | ICD-10-CM | POA: Insufficient documentation

## 2022-08-24 DIAGNOSIS — Z01818 Encounter for other preprocedural examination: Secondary | ICD-10-CM | POA: Diagnosis present

## 2022-08-24 HISTORY — DX: Nontoxic multinodular goiter: E04.2

## 2022-08-24 HISTORY — DX: Type 2 diabetes mellitus without complications: E11.9

## 2022-08-24 HISTORY — DX: Essential (primary) hypertension: I10

## 2022-08-24 HISTORY — DX: Other histiocytosis syndromes: D76.3

## 2022-08-24 HISTORY — DX: Solitary pulmonary nodule: R91.1

## 2022-08-24 HISTORY — DX: Other specified postprocedural states: Z98.890

## 2022-08-24 HISTORY — DX: Nausea with vomiting, unspecified: R11.2

## 2022-08-24 HISTORY — DX: Unspecified asthma, uncomplicated: J45.909

## 2022-08-24 HISTORY — DX: Hyperlipidemia, unspecified: E78.5

## 2022-08-24 HISTORY — DX: Sleep apnea, unspecified: G47.30

## 2022-08-24 LAB — CBC
HCT: 45.3 % (ref 36.0–46.0)
Hemoglobin: 14.3 g/dL (ref 12.0–15.0)
MCH: 27.8 pg (ref 26.0–34.0)
MCHC: 31.6 g/dL (ref 30.0–36.0)
MCV: 88.1 fL (ref 80.0–100.0)
Platelets: 177 10*3/uL (ref 150–400)
RBC: 5.14 MIL/uL — ABNORMAL HIGH (ref 3.87–5.11)
RDW: 15.4 % (ref 11.5–15.5)
WBC: 7.8 10*3/uL (ref 4.0–10.5)
nRBC: 0 % (ref 0.0–0.2)

## 2022-08-24 LAB — GLUCOSE, CAPILLARY: Glucose-Capillary: 135 mg/dL — ABNORMAL HIGH (ref 70–99)

## 2022-08-24 LAB — TYPE AND SCREEN
ABO/RH(D): O POS
Antibody Screen: NEGATIVE

## 2022-08-24 LAB — SURGICAL PCR SCREEN
MRSA, PCR: NEGATIVE
Staphylococcus aureus: NEGATIVE

## 2022-08-24 LAB — BASIC METABOLIC PANEL
Anion gap: 8 (ref 5–15)
BUN: 17 mg/dL (ref 8–23)
CO2: 26 mmol/L (ref 22–32)
Calcium: 9.5 mg/dL (ref 8.9–10.3)
Chloride: 105 mmol/L (ref 98–111)
Creatinine, Ser: 1.1 mg/dL — ABNORMAL HIGH (ref 0.44–1.00)
GFR, Estimated: 55 mL/min — ABNORMAL LOW (ref >60)
Glucose, Bld: 142 mg/dL — ABNORMAL HIGH (ref 70–99)
Potassium: 3.6 mmol/L (ref 3.5–5.1)
Sodium: 139 mmol/L (ref 135–145)

## 2022-08-24 LAB — HEMOGLOBIN A1C
Hgb A1c MFr Bld: 7.4 % — ABNORMAL HIGH (ref 4.8–5.6)
Mean Plasma Glucose: 166 mg/dL

## 2022-08-24 NOTE — Progress Notes (Signed)
Anesthesia Chart Review:  Case: [id number] Date/Time: 08/27/22 1245   Procedure: PLIF - L4-L5 - Interbody Fusion (Back)   Anesthesia type: General   Pre-op diagnosis: Stenosis   Location: [facility] OR ROOM 19 / [place] OR   Surgeons: Kary Kos, MD       DISCUSSION: Patient is a 67 year old scheduled for the above procedure.  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  For anesthesia history, she reported a history of DIFFICULT INTUBATION. She brought in a letter dated 05/19/11 (from the Department of [facility]) indicating "It is more difficult to place the breathing tube for you then and most other people" and encouraged her to communicate this to future anesthesia providers and to wear a "Difficult Tracheal Intubation" Medial Alert bracelet. Per Pre-Anesthesia Consultation on 08/21/20 (see [place]):  "Prior Anesthesia: Patient reports prior difficulty with intubation  NOTE form anesthesiologist: (scanned to chart.) Pt with hx of difficulty with intubation. Unsuccessfully used Mac 3,  Glidescope 4, Glydescope 3. Successfully used Fiberoptic scope."  Regional anesthesia was planned for left TKA with LMA/Difficult airway care immediately available for case including FOB in case or need for intubation. She did not require intubation for her 09/05/20 left uni-knee.   She was last evaluated by [facility] Dr. Benay Spice on 08/20/22. 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 She has had a known left anterior thigh lesion, but had developed a new lesion at the left buttock that was painful/tender. 03/19/22 PET scan showed cutaneous lesions in the left anterior thigh and possible mid chest. Also LUL lung lesion with low level FDG uptake, mildly enlarged right thyroid lobe with calcification, marked hepatic steatosis, and aortic atherosclerosis was noted on PET imaging. Follow-up chest imaging recommended after 07/12/22 chest CT. 08/03/22 right thyroid biopsy was an insufficiency specimen. In regards to RDD, she wished to resume treatment. By notes, [facility] Dr. Katina Dung had recommended thalidomide or methotrexate. Since she had upcoming lumbar fusion, Dr. Benay Spice recommended starting thalidomide after her upcoming surgery, anticipated start date of 09/13/21 after dosing recommendations are confirmed with Dr. Katina Dung.   She reported last ASA 08/20/22 and last Jardiance 08/24/22.   A1c is in process.   VS: BP 136/82   Pulse 79   Temp (!) 36.4 C   Resp 18   Ht _0  (1.6 m)   Wt 106.4 kg   SpO2 100%   BMI 41.56 kg/m   PROVIDERS: Faustino Congress, NP is PCP Betsy Coder, MD is [facility]). Established 10/14/21 after relocating to [place] from [place] in 05/2021.  Katina Dung, Chrystine Oiler, MD is [facility]) Izora Gala, MD is ENT ([facility])   LABS: Labs reviewed: Acceptable for surgery. A1c in process.  (all labs ordered are listed, but only abnormal results are displayed)  Labs Reviewed  GLUCOSE,  CAPILLARY - Abnormal; Notable for the following components:       Result Value   Glucose-Capillary 135 (*)    All other components within normal limits  BASIC METABOLIC PANEL - Abnormal; Notable for the following components:   Glucose, Bld 142 (*)    Creatinine, Ser 1.10 (*)    GFR, Estimated 55 (*)    All other components within normal limits  CBC - Abnormal; Notable for the following components:   RBC 5.14 (*)    All other components within normal limits  SURGICAL PCR SCREEN  HEMOGLOBIN A1C  TYPE AND SCREEN    IMAGES: CT Chest 07/12/22: IMPRESSION: 1. Unchanged size of the solid 9 x 8 mm left upper lobe pulmonary nodule which was minimally metabolic on prior PET-CT. Suggest follow-up chest CT in 6-12 months to assess stability. 2. No new suspicious pulmonary nodules or masses. 3. Stable 12 mm cutaneous nodule in the midline chest. 4. Hepatic steatosis. 5. Heterogeneous nodular enlargement of the thyroid gland previously evaluated by ultrasound on May 14, 2022. 6.  Aortic Atherosclerosis (ICD10-I70.0).   US Thyroid 05/14/22: IMPRESSION: 1. Heterogeneous, enlarged and multinodular thyroid gland most consistent with multinodular goiter. 2. Nodules #5 and #6 in the right inferior gland meet criteria to consider fine-needle aspiration biopsy if not previously sampled. Recommend correlation with prior imaging and biopsy results. 3. Peripherally calcified nodule #3 in the right mid gland meets criteria for imaging surveillance. Recommend follow-up ultrasound in 1 year. - The above is in keeping with the ACR TI-RADS recommendations - J Am Coll Radiol 2017;14:587-595. - 08/03/22 right inferior nodule biopsy was insufficient sample for diagnosis.  PET Scan 03/19/22: IMPRESSION: - Cutaneous lesion in the LEFT anterior thigh suspicious based on history for site of involvement and associated with moderate increased metabolic activity. - Second cutaneous lesion over the mid chest in the midline is nonspecific and could represent a small  sebaceous cysts, correlate with direct clinical inspection. - LEFT upper lobe pulmonary nodule with low level FDG uptake, less than mediastinal blood pool is nonspecific and could represent indolent neoplasm, low-grade bronchogenic neoplasm is considered. Comparison with prior imaging, short interval follow-up or biopsy is suggested. Pulmonary consultation and or consultation with multi disciplinary thoracic oncologic setting may be helpful. - Marked hepatic steatosis. - Aortic atherosclerosis. - These results will be called to the ordering clinician or representative by the Radiologist Assistant, and communication documented in the PACS or [employer]. - ADDENDUM: Heterogeneous and mildly enlarged RIGHT thyroid lobe with calcification, nonspecific. Recommend thyroid ultrasound (ref: J Am Coll Radiol. 2015 Feb;12(2): 143-50).  MRI L-spine 01/10/22: IMPRESSION: 1. Lower lumbar spondylosis including grade 1 anterolisthesis of L5 on S1 due to facet arthropathy. 2. Mild-moderate bilateral foraminal stenosis at L5-S1 and mild bilateral foraminal stenosis at L4-5. 3. No significant canal stenosis at any level. - There is also an interpretation by oncologist Dr. Katina Dung that can be viewed in [facility])  CT Paranasal Sinuses 10/20/21 ([facility]): IMPRESSION:  Postsurgical changes. No evidence of significant inflammatory sinus  disease currently.    EKG: 08/24/22: Normal sinus rhythm Low voltage QRS Cannot rule out Anterior infarct , age undetermined Abnormal ECG No previous ECGs available   CV: N/A  Past Medical History:  Diagnosis Date   Asthma    Diabetes mellitus without complication ([place])    Difficult intubation 05/19/2011   "Unsuccessfully used Mac 3, Glidescope 4, Glydescope 3. Successfully used Fiberoptic scope"   Hyperlipidemia    Hypertension  Multinodular thyroid    Nodule of left lung    PONV (postoperative nausea and vomiting)    Rosai-Dorfman disease  ([place])    Sleep apnea     Past Surgical History:  Procedure Laterality Date   APPENDECTOMY     CHOLECYSTECTOMY     EXPLORATORY LAPAROTOMY     MEDIAL PARTIAL KNEE REPLACEMENT  09/05/2020   NASAL SINUS SURGERY  2005   ROTATOR CUFF REPAIR Left     MEDICATIONS:  acetaminophen (TYLENOL) 500 MG tablet   albuterol (VENTOLIN HFA) 108 (90 Base) MCG/ACT inhaler   amLODipine (NORVASC) 10 MG tablet   aspirin 81 MG chewable tablet   atorvastatin (LIPITOR) 20 MG tablet   Azelastine HCl 137 MCG/SPRAY SOLN   budesonide-formoterol (SYMBICORT) 160-4.5 MCG/ACT inhaler   empagliflozin (JARDIANCE) 10 MG TABS tablet   EPINEPHrine 0.3 mg/0.3 mL IJ SOAJ injection   glipiZIDE (GLUCOTROL XL) 5 MG 24 hr tablet   hydrochlorothiazide (HYDRODIURIL) 25 MG tablet   levocetirizine (XYZAL) 5 MG tablet   lisinopril (ZESTRIL) 40 MG tablet   methocarbamol (ROBAXIN) 500 MG tablet   metoprolol succinate (TOPROL-XL) 50 MG 24 hr tablet   NON FORMULARY   Olopatadine HCl (PATADAY) 0.2 % SOLN   SitaGLIPtin-MetFORMIN HCl (JANUMET XR) [phone] MG TB24   No current facility-administered medications for this encounter.    Myra Gianotti, PA-C Surgical Short Stay/Anesthesiology [facility] Phone [phone] [facility] Phone [phone] 08/24/2022 3:00 PM

## 2022-08-24 NOTE — Anesthesia Preprocedure Evaluation (Addendum)
Anesthesia Evaluation  Patient identified by MRN, date of birth, ID band Patient awake    Reviewed: Allergy & Precautions, NPO status , Patient's Chart, lab work & pertinent test results, reviewed documented beta blocker date and time   History of Anesthesia Complications (+) PONV, DIFFICULT AIRWAY and history of anesthetic complications  Airway Mallampati: IV  TM Distance: <3 FB Neck ROM: Limited    Dental  (+) Dental Advisory Given   Pulmonary sleep apnea and Continuous Positive Airway Pressure Ventilation , COPD,  COPD inhaler   breath sounds clear to auscultation       Cardiovascular hypertension, Pt. on medications and Pt. on home beta blockers (-) angina  Rhythm:Regular Rate:Normal     Neuro/Psych Back pain    GI/Hepatic negative GI ROS, Neg liver ROS,,,  Endo/Other  diabetes (glu 147), Oral Hypoglycemic Agents    Renal/GU negative Renal ROS     Musculoskeletal   Abdominal  (+) + obese  Peds  Hematology negative hematology ROS (+)   Anesthesia Other Findings   Reproductive/Obstetrics                             Anesthesia Physical Anesthesia Plan  ASA: 3  Anesthesia Plan: General   Post-op Pain Management: Tylenol PO (pre-op)*   Induction: Intravenous  PONV Risk Score and Plan: 4 or greater and Ondansetron, Dexamethasone and Scopolamine patch - Pre-op  Airway Management Planned: Oral ETT and Video Laryngoscope Planned  Additional Equipment: None  Intra-op Plan:   Post-operative Plan: Extubation in OR  Informed Consent: I have reviewed the patients History and Physical, chart, labs and discussed the procedure including the risks, benefits and alternatives for the proposed anesthesia with the patient or authorized representative who has indicated his/her understanding and acceptance.     Dental advisory given  Plan Discussed with: CRNA and Surgeon  Anesthesia Plan  Comments: (PAT note written Myra Gianotti, PA-C. Per prior anesthesia records, difficult intubation 05/19/11, "Unsuccessfully used Mac 3, Glidescope 4, Glydescope 3. Successfully used Fiberoptic scope."  )       Anesthesia Quick Evaluation

## 2022-08-24 NOTE — Progress Notes (Signed)
PCP - Nemiah Commander, NP Cardiologist - Denies  PPM/ICD - Denies  Chest x-ray - N/A EKG - 08/24/2021 Stress Test - Denies ECHO - Not sure Cardiac Cath - Denies  Sleep Study - Yes CPAP - Yes.  Pt does not know her settings  Fasting Blood Sugar - 120's- 130's Checks Blood Sugar __Once___ times a day  PAT appointment CBG 135  Last dose of Jardience 08/24/2021.    Blood Thinner Instructions: Denies Aspirin Instructions: Per pt stopped ASA on Friday 08/20/2022  ERAS Protcol - N/A PRE-SURGERY Ensure or G2-   COVID TEST-  N/A   Anesthesia review: No  Patient denies shortness of breath, fever, cough and chest pain at PAT appointment   All instructions explained to the patient, with a verbal understanding of the material. Patient agrees to go over the instructions while at home for a better understanding. Patient also instructed to self quarantine after being tested for COVID-19. The opportunity to ask questions was provided.

## 2022-08-25 ENCOUNTER — Encounter (HOSPITAL_COMMUNITY): Payer: Self-pay

## 2022-08-27 ENCOUNTER — Encounter (HOSPITAL_COMMUNITY)
Admission: RE | Disposition: A | Discharge status: Home / Self Care | Payer: Self-pay | Source: Home / Self Care | Attending: Neurosurgery

## 2022-08-27 ENCOUNTER — Ambulatory Visit (HOSPITAL_COMMUNITY)
Admission: RE | Admit: 2022-08-27 | Discharge: 2022-08-28 | Disposition: A | Discharge status: Home / Self Care | Payer: Medicare Other | Attending: Neurosurgery | Admitting: Neurosurgery

## 2022-08-27 ENCOUNTER — Other Ambulatory Visit: Payer: Self-pay

## 2022-08-27 ENCOUNTER — Ambulatory Visit (HOSPITAL_COMMUNITY): Payer: Medicare Other | Admitting: Vascular Surgery

## 2022-08-27 ENCOUNTER — Ambulatory Visit (HOSPITAL_COMMUNITY): Payer: Medicare Other

## 2022-08-27 ENCOUNTER — Encounter (HOSPITAL_COMMUNITY): Payer: Self-pay | Admitting: Neurosurgery

## 2022-08-27 ENCOUNTER — Ambulatory Visit (HOSPITAL_BASED_OUTPATIENT_CLINIC_OR_DEPARTMENT_OTHER): Payer: Medicare Other | Admitting: Certified Registered Nurse Anesthetist

## 2022-08-27 DIAGNOSIS — I10 Essential (primary) hypertension: Secondary | ICD-10-CM | POA: Diagnosis not present

## 2022-08-27 DIAGNOSIS — M48061 Spinal stenosis, lumbar region without neurogenic claudication: Secondary | ICD-10-CM | POA: Diagnosis not present

## 2022-08-27 DIAGNOSIS — M4726 Other spondylosis with radiculopathy, lumbar region: Secondary | ICD-10-CM | POA: Insufficient documentation

## 2022-08-27 DIAGNOSIS — E119 Type 2 diabetes mellitus without complications: Secondary | ICD-10-CM | POA: Diagnosis not present

## 2022-08-27 DIAGNOSIS — J449 Chronic obstructive pulmonary disease, unspecified: Secondary | ICD-10-CM | POA: Insufficient documentation

## 2022-08-27 DIAGNOSIS — M5416 Radiculopathy, lumbar region: Secondary | ICD-10-CM

## 2022-08-27 DIAGNOSIS — Z7984 Long term (current) use of oral hypoglycemic drugs: Secondary | ICD-10-CM | POA: Diagnosis not present

## 2022-08-27 DIAGNOSIS — M4317 Spondylolisthesis, lumbosacral region: Secondary | ICD-10-CM | POA: Diagnosis present

## 2022-08-27 DIAGNOSIS — G4730 Sleep apnea, unspecified: Secondary | ICD-10-CM | POA: Insufficient documentation

## 2022-08-27 DIAGNOSIS — M4316 Spondylolisthesis, lumbar region: Secondary | ICD-10-CM | POA: Diagnosis not present

## 2022-08-27 DIAGNOSIS — Z79899 Other long term (current) drug therapy: Secondary | ICD-10-CM | POA: Insufficient documentation

## 2022-08-27 LAB — GLUCOSE, CAPILLARY
Glucose-Capillary: 147 mg/dL — ABNORMAL HIGH (ref 70–99)
Glucose-Capillary: 126 mg/dL — ABNORMAL HIGH (ref 70–99)
Glucose-Capillary: 178 mg/dL — ABNORMAL HIGH (ref 70–99)
Glucose-Capillary: 213 mg/dL — ABNORMAL HIGH (ref 70–99)

## 2022-08-27 LAB — ABO/RH: ABO/RH(D): O POS

## 2022-08-27 SURGERY — POSTERIOR LUMBAR FUSION 1 LEVEL
Anesthesia: General | Site: Back

## 2022-08-27 MED ORDER — CYCLOBENZAPRINE HCL 10 MG PO TABS
10.0000 mg | ORAL_TABLET | Freq: Three times a day (TID) | ORAL | Status: DC | PRN
  Administered 2022-08-28: 10 mg via ORAL
  Filled 2022-08-27: qty 1

## 2022-08-27 MED ORDER — SODIUM CHLORIDE 0.9% FLUSH: 3.0000 mL | INTRAVENOUS | Status: DC | PRN

## 2022-08-27 MED ORDER — CHLORHEXIDINE GLUCONATE CLOTH 2 % EX PADS: 6.0000 | MEDICATED_PAD | Freq: Once | CUTANEOUS | Status: DC

## 2022-08-27 MED ORDER — ONDANSETRON HCL 4 MG/2ML IJ SOLN: 4.0000 mg | Freq: Four times a day (QID) | INTRAMUSCULAR | Status: DC | PRN

## 2022-08-27 MED ORDER — SODIUM CHLORIDE 0.9 % IV SOLN
250.0000 mL | INTRAVENOUS | Status: DC
  Administered 2022-08-27: 250 mL via INTRAVENOUS

## 2022-08-27 MED ORDER — OXYMETAZOLINE HCL 0.05 % NA SOLN
NASAL | Status: DC | PRN
  Administered 2022-08-27: 2 via NASAL

## 2022-08-27 MED ORDER — LIDOCAINE 2% (20 MG/ML) 5 ML SYRINGE
INTRAMUSCULAR | Status: DC | PRN
  Administered 2022-08-27: 40 mg via INTRAVENOUS

## 2022-08-27 MED ORDER — ROCURONIUM BROMIDE 10 MG/ML (PF) SYRINGE
PREFILLED_SYRINGE | INTRAVENOUS | Status: DC | PRN
  Administered 2022-08-27: 50 mg via INTRAVENOUS
  Administered 2022-08-27: 20 mg via INTRAVENOUS

## 2022-08-27 MED ORDER — MIDAZOLAM HCL 2 MG/2ML IJ SOLN
INTRAMUSCULAR | Status: AC
  Filled 2022-08-27: qty 2

## 2022-08-27 MED ORDER — ORAL CARE MOUTH RINSE: 15.0000 mL | Freq: Once | OROMUCOSAL | Status: AC

## 2022-08-27 MED ORDER — DEXAMETHASONE SODIUM PHOSPHATE 10 MG/ML IJ SOLN
INTRAMUSCULAR | Status: DC | PRN
  Administered 2022-08-27: 5 mg via INTRAVENOUS

## 2022-08-27 MED ORDER — METOPROLOL SUCCINATE ER 50 MG PO TB24
50.0000 mg | ORAL_TABLET | Freq: Every evening | ORAL | Status: DC
  Administered 2022-08-27: 50 mg via ORAL
  Filled 2022-08-27: qty 1

## 2022-08-27 MED ORDER — SCOPOLAMINE 1 MG/3DAYS TD PT72
MEDICATED_PATCH | TRANSDERMAL | Status: AC
  Filled 2022-08-27: qty 1

## 2022-08-27 MED ORDER — THROMBIN 20000 UNITS EX SOLR: CUTANEOUS | Status: DC | PRN

## 2022-08-27 MED ORDER — MOMETASONE FURO-FORMOTEROL FUM 200-5 MCG/ACT IN AERO
2.0000 | INHALATION_SPRAY | Freq: Two times a day (BID) | RESPIRATORY_TRACT | Status: DC
  Administered 2022-08-28: 2 via RESPIRATORY_TRACT
  Filled 2022-08-27: qty 8.8

## 2022-08-27 MED ORDER — ONDANSETRON HCL 4 MG PO TABS
4.0000 mg | ORAL_TABLET | Freq: Four times a day (QID) | ORAL | Status: DC | PRN
  Administered 2022-08-28: 4 mg via ORAL
  Filled 2022-08-27: qty 1

## 2022-08-27 MED ORDER — SCOPOLAMINE 1 MG/3DAYS TD PT72
1.0000 | MEDICATED_PATCH | TRANSDERMAL | Status: DC
  Administered 2022-08-27: 1.5 mg via TRANSDERMAL

## 2022-08-27 MED ORDER — MIDAZOLAM HCL 5 MG/5ML IJ SOLN
INTRAMUSCULAR | Status: DC | PRN
  Administered 2022-08-27: 1 mg via INTRAVENOUS

## 2022-08-27 MED ORDER — LACTATED RINGERS IV SOLN: INTRAVENOUS | Status: DC | PRN

## 2022-08-27 MED ORDER — LORATADINE 10 MG PO TABS: 10.0000 mg | ORAL_TABLET | Freq: Every day | ORAL | Status: DC

## 2022-08-27 MED ORDER — CEFAZOLIN SODIUM-DEXTROSE 2-4 GM/100ML-% IV SOLN
2.0000 g | Freq: Three times a day (TID) | INTRAVENOUS | Status: DC
  Administered 2022-08-27 – 2022-08-28 (×3): 2 g via INTRAVENOUS
  Filled 2022-08-27 (×3): qty 100

## 2022-08-27 MED ORDER — GLIPIZIDE ER 5 MG PO TB24
5.0000 mg | ORAL_TABLET | Freq: Every morning | ORAL | Status: DC
  Administered 2022-08-28: 5 mg via ORAL
  Filled 2022-08-27: qty 1

## 2022-08-27 MED ORDER — LEVOCETIRIZINE DIHYDROCHLORIDE 5 MG PO TABS: 5.0000 mg | ORAL_TABLET | Freq: Every evening | ORAL | Status: DC

## 2022-08-27 MED ORDER — LIDOCAINE-EPINEPHRINE 1 %-1:100000 IJ SOLN
INTRAMUSCULAR | Status: AC
  Filled 2022-08-27: qty 1

## 2022-08-27 MED ORDER — CEFAZOLIN SODIUM-DEXTROSE 2-4 GM/100ML-% IV SOLN
INTRAVENOUS | Status: AC
  Filled 2022-08-27: qty 100

## 2022-08-27 MED ORDER — ACETAMINOPHEN 500 MG PO TABS: 500.0000 mg | ORAL_TABLET | Freq: Four times a day (QID) | ORAL | Status: DC | PRN

## 2022-08-27 MED ORDER — HYDROMORPHONE HCL 1 MG/ML IJ SOLN
0.5000 mg | INTRAMUSCULAR | Status: DC | PRN
  Administered 2022-08-27 – 2022-08-28 (×4): 0.5 mg via INTRAVENOUS
  Filled 2022-08-27 (×4): qty 0.5

## 2022-08-27 MED ORDER — ALBUTEROL SULFATE (2.5 MG/3ML) 0.083% IN NEBU: 2.5000 mg | INHALATION_SOLUTION | Freq: Four times a day (QID) | RESPIRATORY_TRACT | Status: DC | PRN

## 2022-08-27 MED ORDER — PHENYLEPHRINE HCL-NACL 20-0.9 MG/250ML-% IV SOLN
INTRAVENOUS | Status: DC | PRN
  Administered 2022-08-27: 15 ug/min via INTRAVENOUS

## 2022-08-27 MED ORDER — BUPIVACAINE HCL (PF) 0.25 % IJ SOLN
INTRAMUSCULAR | Status: AC
  Filled 2022-08-27: qty 30

## 2022-08-27 MED ORDER — ACETAMINOPHEN 500 MG PO TABS
1000.0000 mg | ORAL_TABLET | Freq: Four times a day (QID) | ORAL | Status: DC | PRN
  Administered 2022-08-28: 1000 mg via ORAL
  Filled 2022-08-27: qty 2

## 2022-08-27 MED ORDER — ROCURONIUM BROMIDE 10 MG/ML (PF) SYRINGE
PREFILLED_SYRINGE | INTRAVENOUS | Status: AC
  Filled 2022-08-27: qty 20

## 2022-08-27 MED ORDER — AZELASTINE HCL 0.1 % NA SOLN
1.0000 | Freq: Two times a day (BID) | NASAL | Status: DC
  Administered 2022-08-28: 1 via NASAL
  Filled 2022-08-27: qty 30

## 2022-08-27 MED ORDER — LISINOPRIL 20 MG PO TABS
40.0000 mg | ORAL_TABLET | Freq: Every morning | ORAL | Status: DC
  Administered 2022-08-28: 40 mg via ORAL
  Filled 2022-08-27: qty 2

## 2022-08-27 MED ORDER — ACETAMINOPHEN 325 MG PO TABS: 650.0000 mg | ORAL_TABLET | ORAL | Status: DC | PRN

## 2022-08-27 MED ORDER — OXYCODONE HCL 5 MG/5ML PO SOLN: 5.0000 mg | Freq: Once | ORAL | Status: DC | PRN

## 2022-08-27 MED ORDER — HYDROMORPHONE HCL 1 MG/ML IJ SOLN
INTRAMUSCULAR | Status: AC
  Filled 2022-08-27: qty 1

## 2022-08-27 MED ORDER — ACETAMINOPHEN 500 MG PO TABS
1000.0000 mg | ORAL_TABLET | Freq: Once | ORAL | Status: AC
  Administered 2022-08-27: 1000 mg via ORAL

## 2022-08-27 MED ORDER — HYDROMORPHONE HCL 1 MG/ML IJ SOLN
0.2500 mg | INTRAMUSCULAR | Status: DC | PRN
  Administered 2022-08-27 (×2): 0.5 mg via INTRAVENOUS

## 2022-08-27 MED ORDER — ONDANSETRON HCL 4 MG/2ML IJ SOLN
INTRAMUSCULAR | Status: DC | PRN
  Administered 2022-08-27: 4 mg via INTRAVENOUS

## 2022-08-27 MED ORDER — PHENOL 1.4 % MT LIQD: 1.0000 | OROMUCOSAL | Status: DC | PRN

## 2022-08-27 MED ORDER — EPINEPHRINE 0.3 MG/0.3ML IJ SOAJ
0.3000 mg | INTRAMUSCULAR | Status: DC | PRN
  Filled 2022-08-27: qty 0.6

## 2022-08-27 MED ORDER — CEFAZOLIN SODIUM-DEXTROSE 2-4 GM/100ML-% IV SOLN
2.0000 g | INTRAVENOUS | Status: AC
  Administered 2022-08-27: 2 g via INTRAVENOUS

## 2022-08-27 MED ORDER — PANTOPRAZOLE SODIUM 40 MG IV SOLR
40.0000 mg | Freq: Every day | INTRAVENOUS | Status: DC
  Administered 2022-08-27: 40 mg via INTRAVENOUS
  Filled 2022-08-27: qty 10

## 2022-08-27 MED ORDER — LIDOCAINE 2% (20 MG/ML) 5 ML SYRINGE
INTRAMUSCULAR | Status: AC
  Filled 2022-08-27: qty 5

## 2022-08-27 MED ORDER — HYDROCHLOROTHIAZIDE 25 MG PO TABS
25.0000 mg | ORAL_TABLET | Freq: Every morning | ORAL | Status: DC
  Administered 2022-08-28: 25 mg via ORAL
  Filled 2022-08-27: qty 1

## 2022-08-27 MED ORDER — LORATADINE 10 MG PO TABS
10.0000 mg | ORAL_TABLET | Freq: Every evening | ORAL | Status: DC
  Administered 2022-08-27: 10 mg via ORAL
  Filled 2022-08-27: qty 1

## 2022-08-27 MED ORDER — CHLORHEXIDINE GLUCONATE 0.12 % MT SOLN
15.0000 mL | Freq: Once | OROMUCOSAL | Status: AC
  Administered 2022-08-27: 15 mL via OROMUCOSAL

## 2022-08-27 MED ORDER — METHOCARBAMOL 500 MG PO TABS
500.0000 mg | ORAL_TABLET | Freq: Three times a day (TID) | ORAL | Status: DC | PRN
  Administered 2022-08-27: 500 mg via ORAL
  Filled 2022-08-27: qty 1

## 2022-08-27 MED ORDER — ACETAMINOPHEN 650 MG RE SUPP: 650.0000 mg | RECTAL | Status: DC | PRN

## 2022-08-27 MED ORDER — EMPAGLIFLOZIN 10 MG PO TABS
10.0000 mg | ORAL_TABLET | Freq: Every morning | ORAL | Status: DC
  Administered 2022-08-28: 10 mg via ORAL
  Filled 2022-08-27: qty 1

## 2022-08-27 MED ORDER — ONDANSETRON HCL 4 MG/2ML IJ SOLN
INTRAMUSCULAR | Status: AC
  Filled 2022-08-27: qty 2

## 2022-08-27 MED ORDER — MIDAZOLAM HCL 2 MG/2ML IJ SOLN: 0.5000 mg | Freq: Once | INTRAMUSCULAR | Status: DC | PRN

## 2022-08-27 MED ORDER — SITAGLIP PHOS-METFORMIN HCL ER 100-1000 MG PO TB24: 1.0000 | ORAL_TABLET | Freq: Every evening | ORAL | Status: DC

## 2022-08-27 MED ORDER — SODIUM CHLORIDE 0.9% FLUSH
3.0000 mL | Freq: Two times a day (BID) | INTRAVENOUS | Status: DC
  Administered 2022-08-27 – 2022-08-28 (×2): 3 mL via INTRAVENOUS

## 2022-08-27 MED ORDER — THROMBIN 5000 UNITS EX SOLR
CUTANEOUS | Status: AC
  Filled 2022-08-27: qty 5000

## 2022-08-27 MED ORDER — LACTATED RINGERS IV SOLN: INTRAVENOUS | Status: DC

## 2022-08-27 MED ORDER — MENTHOL 3 MG MT LOZG: 1.0000 | LOZENGE | OROMUCOSAL | Status: DC | PRN

## 2022-08-27 MED ORDER — SUGAMMADEX SODIUM 200 MG/2ML IV SOLN
INTRAVENOUS | Status: DC | PRN
  Administered 2022-08-27 (×2): 100 mg via INTRAVENOUS

## 2022-08-27 MED ORDER — BUPIVACAINE LIPOSOME 1.3 % IJ SUSP
INTRAMUSCULAR | Status: AC
  Filled 2022-08-27: qty 20

## 2022-08-27 MED ORDER — ACETAMINOPHEN 500 MG PO TABS
ORAL_TABLET | ORAL | Status: AC
  Filled 2022-08-27: qty 2

## 2022-08-27 MED ORDER — OXYCODONE HCL 5 MG PO TABS: 5.0000 mg | ORAL_TABLET | Freq: Once | ORAL | Status: DC | PRN

## 2022-08-27 MED ORDER — METFORMIN HCL ER 500 MG PO TB24
1000.0000 mg | ORAL_TABLET | Freq: Every day | ORAL | Status: DC
  Administered 2022-08-27: 1000 mg via ORAL
  Filled 2022-08-27: qty 2

## 2022-08-27 MED ORDER — PROPOFOL 10 MG/ML IV BOLUS
INTRAVENOUS | Status: DC | PRN
  Administered 2022-08-27: 30 mg via INTRAVENOUS
  Administered 2022-08-27: 120 mg via INTRAVENOUS

## 2022-08-27 MED ORDER — BUPIVACAINE LIPOSOME 1.3 % IJ SUSP
INTRAMUSCULAR | Status: DC | PRN
  Administered 2022-08-27: 10 mL

## 2022-08-27 MED ORDER — ASPIRIN 81 MG PO CHEW
81.0000 mg | CHEWABLE_TABLET | Freq: Every evening | ORAL | Status: DC
  Administered 2022-08-27: 81 mg via ORAL
  Filled 2022-08-27: qty 1

## 2022-08-27 MED ORDER — LIDOCAINE-EPINEPHRINE 1 %-1:100000 IJ SOLN
INTRAMUSCULAR | Status: DC | PRN
  Administered 2022-08-27: 10 mL

## 2022-08-27 MED ORDER — 0.9 % SODIUM CHLORIDE (POUR BTL) OPTIME
TOPICAL | Status: DC | PRN
  Administered 2022-08-27: 1000 mL

## 2022-08-27 MED ORDER — THROMBIN 20000 UNITS EX SOLR
CUTANEOUS | Status: AC
  Filled 2022-08-27: qty 20000

## 2022-08-27 MED ORDER — INSULIN ASPART 100 UNIT/ML IJ SOLN
0.0000 [IU] | INTRAMUSCULAR | Status: DC | PRN
  Administered 2022-08-27: 2 [IU] via SUBCUTANEOUS
  Filled 2022-08-27: qty 1

## 2022-08-27 MED ORDER — AMLODIPINE BESYLATE 10 MG PO TABS
10.0000 mg | ORAL_TABLET | Freq: Every evening | ORAL | Status: DC
  Administered 2022-08-27: 10 mg via ORAL
  Filled 2022-08-27: qty 1

## 2022-08-27 MED ORDER — FENTANYL CITRATE (PF) 250 MCG/5ML IJ SOLN
INTRAMUSCULAR | Status: AC
  Filled 2022-08-27: qty 5

## 2022-08-27 MED ORDER — LINAGLIPTIN 5 MG PO TABS
5.0000 mg | ORAL_TABLET | Freq: Every day | ORAL | Status: DC
  Administered 2022-08-27: 5 mg via ORAL
  Filled 2022-08-27: qty 1

## 2022-08-27 MED ORDER — DEXAMETHASONE SODIUM PHOSPHATE 10 MG/ML IJ SOLN
INTRAMUSCULAR | Status: AC
  Filled 2022-08-27: qty 1

## 2022-08-27 MED ORDER — ATORVASTATIN CALCIUM 10 MG PO TABS
20.0000 mg | ORAL_TABLET | Freq: Every evening | ORAL | Status: DC
  Administered 2022-08-27: 20 mg via ORAL
  Filled 2022-08-27: qty 2

## 2022-08-27 MED ORDER — PROMETHAZINE HCL 25 MG/ML IJ SOLN
6.2500 mg | INTRAMUSCULAR | Status: DC | PRN
  Administered 2022-08-27: 6.25 mg via INTRAVENOUS

## 2022-08-27 MED ORDER — PROMETHAZINE HCL 25 MG/ML IJ SOLN
INTRAMUSCULAR | Status: AC
  Filled 2022-08-27: qty 1

## 2022-08-27 MED ORDER — OLOPATADINE HCL 0.1 % OP SOLN
1.0000 [drp] | Freq: Two times a day (BID) | OPHTHALMIC | Status: DC
  Administered 2022-08-28: 1 [drp] via OPHTHALMIC
  Filled 2022-08-27: qty 5

## 2022-08-27 MED ORDER — FENTANYL CITRATE (PF) 250 MCG/5ML IJ SOLN
INTRAMUSCULAR | Status: DC | PRN
  Administered 2022-08-27: 100 ug via INTRAVENOUS
  Administered 2022-08-27 (×2): 50 ug via INTRAVENOUS

## 2022-08-27 MED ORDER — ALUM & MAG HYDROXIDE-SIMETH 200-200-20 MG/5ML PO SUSP: 30.0000 mL | Freq: Four times a day (QID) | ORAL | Status: DC | PRN

## 2022-08-27 MED ORDER — INSULIN ASPART 100 UNIT/ML IJ SOLN
0.0000 [IU] | Freq: Three times a day (TID) | INTRAMUSCULAR | Status: DC
  Administered 2022-08-28: 2 [IU] via SUBCUTANEOUS
  Administered 2022-08-28: 3 [IU] via SUBCUTANEOUS

## 2022-08-27 SURGICAL SUPPLY — 77 items
BAG COUNTER SPONGE SURGICOUNT (BAG) ×1 IMPLANT
BASKET BONE COLLECTION (BASKET) ×1 IMPLANT
BENZOIN TINCTURE PRP APPL 2/3 (GAUZE/BANDAGES/DRESSINGS) ×1 IMPLANT
BLADE BONE MILL MEDIUM (MISCELLANEOUS) ×1 IMPLANT
BLADE CLIPPER SURG (BLADE) IMPLANT
BLADE SURG 11 STRL SS (BLADE) ×1 IMPLANT
BONE VIVIGEN FORMABLE 5.4CC (Bone Implant) ×1 IMPLANT
BUR CUTTER 7.0 ROUND (BURR) ×1 IMPLANT
BUR MATCHSTICK NEURO 3.0 LAGG (BURR) ×1 IMPLANT
CAGE ALTERA 10X31X9-13 15D (Cage) IMPLANT
CANISTER SUCT 3000ML PPV (MISCELLANEOUS) ×1 IMPLANT
CAP LOCKING THREADED (Cap) IMPLANT
CNTNR URN SCR LID CUP LEK RST (MISCELLANEOUS) ×1 IMPLANT
CONT SPEC 4OZ STRL OR WHT (MISCELLANEOUS) ×1
COVER BACK TABLE 60X90IN (DRAPES) ×1 IMPLANT
DERMABOND ADVANCED .7 DNX12 (GAUZE/BANDAGES/DRESSINGS) ×1 IMPLANT
DRAPE C-ARM 42X72 X-RAY (DRAPES) ×2 IMPLANT
DRAPE C-ARMOR (DRAPES) IMPLANT
DRAPE HALF SHEET 40X57 (DRAPES) IMPLANT
DRAPE LAPAROTOMY 100X72X124 (DRAPES) ×1 IMPLANT
DRAPE SURG 17X23 STRL (DRAPES) ×1 IMPLANT
DRAPE TOWEL STERILE LF 18X24 (DRAPES) IMPLANT
DRSG OPSITE 4X5.5 SM (GAUZE/BANDAGES/DRESSINGS) ×1 IMPLANT
DRSG OPSITE POSTOP 4X6 (GAUZE/BANDAGES/DRESSINGS) ×1 IMPLANT
DRSG OPSITE POSTOP 4X8 (GAUZE/BANDAGES/DRESSINGS) IMPLANT
DURAPREP 26ML APPLICATOR (WOUND CARE) ×1 IMPLANT
ELECT REM PT RETURN 9FT ADLT (ELECTROSURGICAL) ×1
ELECTRODE REM PT RTRN 9FT ADLT (ELECTROSURGICAL) ×1 IMPLANT
EVACUATOR 1/8 PVC DRAIN (DRAIN) IMPLANT
EVACUATOR 3/16  PVC DRAIN (DRAIN)
EVACUATOR 3/16 PVC DRAIN (DRAIN) IMPLANT
GAUZE 4X4 16PLY ~~LOC~~+RFID DBL (SPONGE) IMPLANT
GAUZE SPONGE 4X4 12PLY STRL (GAUZE/BANDAGES/DRESSINGS) ×1 IMPLANT
GLOVE BIO SURGEON STRL SZ7 (GLOVE) IMPLANT
GLOVE BIO SURGEON STRL SZ8 (GLOVE) ×2 IMPLANT
GLOVE BIOGEL PI IND STRL 7.0 (GLOVE) IMPLANT
GLOVE BIOGEL PI IND STRL 7.5 (GLOVE) IMPLANT
GLOVE EXAM NITRILE XL STR (GLOVE) IMPLANT
GLOVE INDICATOR 8.5 STRL (GLOVE) ×4 IMPLANT
GOWN STRL REUS W/ TWL LRG LVL3 (GOWN DISPOSABLE) IMPLANT
GOWN STRL REUS W/ TWL XL LVL3 (GOWN DISPOSABLE) ×2 IMPLANT
GOWN STRL REUS W/TWL 2XL LVL3 (GOWN DISPOSABLE) IMPLANT
GOWN STRL REUS W/TWL LRG LVL3 (GOWN DISPOSABLE)
GOWN STRL REUS W/TWL XL LVL3 (GOWN DISPOSABLE) ×2
GRAFT BNE MATRIX VG FRMBL MD 5 (Bone Implant) IMPLANT
GRAFT BONE PROTEIOS MED 2.5CC (Orthopedic Implant) IMPLANT
KIT BASIN OR (CUSTOM PROCEDURE TRAY) ×1 IMPLANT
KIT GRAFTMAG DEL NEURO DISP (NEUROSURGERY SUPPLIES) IMPLANT
KIT TURNOVER KIT B (KITS) ×1 IMPLANT
MILL BONE PREP (MISCELLANEOUS) ×1 IMPLANT
NDL HYPO 21X1.5 SAFETY (NEEDLE) IMPLANT
NDL HYPO 25X1 1.5 SAFETY (NEEDLE) ×1 IMPLANT
NEEDLE HYPO 21X1.5 SAFETY (NEEDLE) ×1 IMPLANT
NEEDLE HYPO 25X1 1.5 SAFETY (NEEDLE) ×1 IMPLANT
NS IRRIG 1000ML POUR BTL (IV SOLUTION) ×1 IMPLANT
PACK LAMINECTOMY NEURO (CUSTOM PROCEDURE TRAY) ×1 IMPLANT
PAD ARMBOARD 7.5X6 YLW CONV (MISCELLANEOUS) ×3 IMPLANT
ROD 40MM SPINAL (Rod) IMPLANT
SCREW 6.5X5.5 30MM (Screw) IMPLANT
SCREW PA THRD CREO TULIP 5.5X4 (Head) IMPLANT
SHAFT CREO 30MM (Neuro Prosthesis/Implant) IMPLANT
SPIKE FLUID TRANSFER (MISCELLANEOUS) ×1 IMPLANT
SPONGE SURGIFOAM ABS GEL 100 (HEMOSTASIS) ×1 IMPLANT
SPONGE T-LAP 4X18 ~~LOC~~+RFID (SPONGE) IMPLANT
STRIP CLOSURE SKIN 1/2X4 (GAUZE/BANDAGES/DRESSINGS) ×2 IMPLANT
SUT VIC AB 0 CT1 18XCR BRD8 (SUTURE) ×2 IMPLANT
SUT VIC AB 0 CT1 8-18 (SUTURE) ×2
SUT VIC AB 2-0 CT1 18 (SUTURE) ×1 IMPLANT
SUT VIC AB 4-0 PS2 27 (SUTURE) ×1 IMPLANT
SYR 20ML ECCENTRIC (SYRINGE) IMPLANT
SYR 20ML LL LF (SYRINGE) IMPLANT
TOWEL GREEN STERILE (TOWEL DISPOSABLE) ×1 IMPLANT
TOWEL GREEN STERILE FF (TOWEL DISPOSABLE) ×1 IMPLANT
TRAY FOL W/BAG SLVR 16FR STRL (SET/KITS/TRAYS/PACK) IMPLANT
TRAY FOLEY MTR SLVR 16FR STAT (SET/KITS/TRAYS/PACK) ×1 IMPLANT
TRAY FOLEY W/BAG SLVR 16FR LF (SET/KITS/TRAYS/PACK) ×1
WATER STERILE IRR 1000ML POUR (IV SOLUTION) ×1 IMPLANT

## 2022-08-27 NOTE — Op Note (Signed)
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  Postoperative diagnosis: Same.  Procedure: #1 bilateral Gill decompressive laminotomies L5-S1 with complete medial facetectomies and radical foraminotomies of the L5 and S1 nerve roots.  2.  Transforaminal interbody fusion L5-S1 utilizing the globus Altera 9-13 expandable cage packed with locally harvested autograft mixed with Vivigen and Proteus.  3.  Cortical screw fixation L5-S1 utilizing the globus Creo amp modular cortical screw set.  Surgeon: Kary Kos.  Assistant: Duffy Rhody.  Assistant to: Nash Shearer.  Anesthesia: General.  EBL: 250.  HPI: 67 year old female with severe back and bilateral hip and leg pain rating down L5 nerve root pattern workup revealed grade 1 spinal listhesis at L5-S1 with severe L5 and S1 foraminal stenosis due to her progression of clinical syndrome imaging findings of a conservative treatment with bilateral L5-S1 facet blocks and L5 transforaminal epidurals I recommended an interbody fusion at L5-S1.  I extensively went over the risks and benefits of the operation with her as well as perioperative course expectations of outcome and alternatives of surgery and she understood and agreed to proceed forward.  Operative procedure: Patient was brought into the OR was induced under general anesthesia positioned prone on the Wilson frame her back was prepped and draped in routine sterile fashion.  Preoperative x-ray localized the appropriate level so after infiltration of 10 cc lidocaine with epi midline incision was made and Bovie electrocautery was used to take down the subcutaneous tissue and subperiosteal dissection was carried lamina of L4-L5 and S1 bilaterally.  Intraoperative x-ray confirmed application of the appropriate level.  So then bilateral laminotomies were drilled down and performed complete medial facetectomies and  radical foraminotomies the L5 and S1 nerve roots were all carried out drilling off the medial inferior aspect of both pedicles allow identification and decompression of the L5 nerve roots bilaterally this completed the Gill part of the decompression.  Then disc base was inspected I elected to do a transforaminal interbody fusion from the right side cleaned out the disc.  The endplates and with sequential distraction help reduce the deformity I selected 9-13 expandable Altera cage 31 mm in length and inserted it rotated and expanded it deployed at and I did tell that after packing and extensive mount of autograft material anteriorly.  Then she inspected the foramina again to confirm patency and no migration of graft material everything was wide open I then placed all 4 cortical screws all screws had excellent purchase slightly selected out to 40 mm rods assembled the heads anchored the screws and anchored the rods in place torqued everything down.  Inspected the foramen again packed with Gelfoam placed a medium Hemovac drain injected Exparel in the fascia and closed the wound in layers with interrupted Vicryl and a running 4 subcuticular.  Dermabond benzoin Steri-Strips and sterile dressing was applied patient recovery in stable condition.  At the end the case all needle counts and sponge counts were correct.

## 2022-08-27 NOTE — H&P (Signed)
Shari Hill is an 66 y.o. female.   Chief Complaint: Back and right greater than left leg pain HPI: 67 year old female with longstanding back and right L5 radicular symptoms workup revealed grade 1 spondylolisthesis at L5-S1 mild spondylosis at L3-4 and L4-5.  Patient underwent multiple rounds of transforaminal epidurals at L5 but also facet blocks at L5-S1.  And due to her progression of clinical syndrome failed conservative treatment imaging I recommended decompression stabilization procedure at L5-S1.  I extensively gone over the risks and benefits of the operation with her as well as perioperative course expectations of outcome and alternatives of surgery and she understands and agrees to proceed forward.  Past Medical History:  Diagnosis Date   Asthma    Diabetes mellitus without complication ([place])    Difficult intubation 05/19/2011   "Unsuccessfully used Mac 3, Glidescope 4, Glydescope 3. 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 No pertinent family history. Social History:  reports that she has never smoked. She has never used smokeless tobacco. She reports that she does not currently use alcohol after a past usage of about 1.0 standard drink of alcohol per week. She reports that she does not use drugs.  Allergies:  Allergies  Allergen Reactions   Zocor [Simvastatin] Other (See Comments)    Unknown reaction per pt    Latex Hives and Rash    Medications Prior to Admission  Medication Sig Dispense Refill   acetaminophen (TYLENOL) 500 MG tablet Take 500-1,000 mg by mouth  every 6 (six) hours as needed (pain.).     amLODipine (NORVASC) 10 MG tablet Take 10 mg by mouth every evening.     aspirin 81 MG chewable tablet Chew 81 mg by mouth every evening.     atorvastatin (LIPITOR) 20 MG tablet Take 20 mg by mouth every evening.     Azelastine HCl 137 MCG/SPRAY SOLN Place 1 spray into both nostrils in the morning and at bedtime.     budesonide-formoterol (SYMBICORT) 160-4.5 MCG/ACT inhaler Inhale 2 puffs into the lungs 2 (two) times daily as needed (respiratory issues.).     empagliflozin (JARDIANCE) 10 MG TABS tablet Take 10 mg by mouth in the morning.     EPINEPHrine 0.3 mg/0.3 mL IJ SOAJ injection Inject 0.3 mg into the muscle as needed for anaphylaxis. (Patient not taking: Reported on 08/20/2022)     glipiZIDE (GLUCOTROL XL) 5 MG 24 hr tablet Take 5 mg by mouth in the morning.     hydrochlorothiazide (HYDRODIURIL) 25 MG tablet Take 25 mg by mouth in the morning.     levocetirizine (XYZAL) 5 MG tablet Take 5 mg by mouth every evening.     lisinopril (ZESTRIL) 40 MG tablet Take 40 mg by mouth in the morning.     methocarbamol (ROBAXIN) 500 MG tablet Take 500 mg by mouth 3 (three) times daily as needed for muscle spasms.     metoprolol succinate (TOPROL-XL) 50 MG 24 hr tablet Take 50 mg by mouth every  evening.     NON FORMULARY Inject 1 Dose as directed once a week. Allergy Shot     Olopatadine HCl (PATADAY) 0.2 % SOLN Place 1 drop into both eyes 2 (two) times daily as needed (allergy eyes/irritation.).     SitaGLIPtin-MetFORMIN HCl (JANUMET XR) [phone] MG TB24 Take 1 tablet by mouth every evening.     albuterol (VENTOLIN HFA) 108 (90 Base) MCG/ACT inhaler Inhale into the lungs every 6 (six) hours as needed for wheezing or shortness of breath.      Results for orders placed or performed during the hospital encounter of 08/27/22 (from the past 48 hour(s))  Glucose, capillary     Status: Abnormal   Collection Time: 08/27/22 11:14 AM  Result Value Ref Range    Glucose-Capillary 147 (H) 70 - 99 mg/dL    Comment: Glucose reference range applies only to samples taken after fasting for at least 8 hours.   Comment 1 Notify RN    Comment 2 Document in Chart   ABO/Rh     Status: None (Preliminary result)   Collection Time: 08/27/22 12:00 PM  Result Value Ref Range   ABO/RH(D) PENDING    No results found.  Review of Systems  Musculoskeletal:  Positive for back pain.  Neurological:  Positive for numbness.    Blood pressure (!) 159/93, pulse 76, temperature 97.7 F (36.5 C), temperature source Oral, resp. rate 18, height _0  (1.6 m), weight 106.6 kg, SpO2 98 %. Physical Exam HENT:     Head: Normocephalic.     Right Ear: Tympanic membrane normal.     Nose: Nose normal.     Mouth/Throat:     Mouth: Mucous membranes are moist.  Eyes:     Pupils: Pupils are equal, round, and reactive to light.  Cardiovascular:     Rate and Rhythm: Normal rate.  Pulmonary:     Effort: Pulmonary effort is normal.  Abdominal:     General: Abdomen is flat.  Musculoskeletal:        General: Normal range of motion.  Skin:    General: Skin is warm.  Neurological:     Mental Status: She is alert.     Comments: Strength is 5 out of 5 iliopsoas, quads, hamstrings, gastroc, into tibialis and EHL.      Assessment/Plan 67 year old female presents for an L5-S1 posterior lumbar interbody fusion possible transforaminal approach  Elaina Hoops, MD 08/27/2022, 12:21 PM

## 2022-08-27 NOTE — Anesthesia Procedure Notes (Signed)
Procedure Name: Intubation Date/Time: 08/27/2022 1:26 PM  Performed by: Glynda Jaeger, CRNAPre-anesthesia Checklist: Patient identified, Patient being monitored, Timeout performed, Emergency Drugs available and Suction available Patient Re-evaluated:Patient Re-evaluated prior to induction Oxygen Delivery Method: Circle System Utilized Preoxygenation: Pre-oxygenation with 100% oxygen Induction Type: IV induction Ventilation: Mask ventilation without difficulty Laryngoscope Size: 3 and Glidescope Grade View: Grade I Tube type: Parker flex tip Tube size: 7.0 mm Number of attempts: 1 Airway Equipment and Method: Video-laryngoscopy Placement Confirmation: ETT inserted through vocal cords under direct vision, positive ETCO2 and breath sounds checked- equal and bilateral Secured at: 23 cm Tube secured with: Tape Dental Injury: Teeth and Oropharynx as per pre-operative assessment  Difficulty Due To: Difficulty was anticipated, Difficult Airway- due to reduced neck mobility, Difficult Airway- due to limited oral opening and Difficult Airway- due to anterior larynx Comments: VL x 1 with Glidescope 3, grade I view.

## 2022-08-27 NOTE — Transfer of Care (Signed)
Immediate Anesthesia Transfer of Care Note  Patient: Shari Hill  Procedure(s) Performed: Posterior Lumbar Interbody Fusion - Lumbar Five-Sacral One (Back)  Patient Location: PACU  Anesthesia Type:General  Level of Consciousness: awake, alert , oriented, and drowsy  Airway & Oxygen Therapy: Patient Spontanous Breathing and Patient connected to nasal cannula oxygen  Post-op Assessment: Report given to RN and Post -op Vital signs reviewed and stable  Post vital signs: Reviewed and stable  Last Vitals:  Vitals Value Taken Time  BP 139/65 08/27/22 1705  Temp 36.6 C 08/27/22 1705  Pulse 79 08/27/22 1710  Resp 20 08/27/22 1710  SpO2 93 % 08/27/22 1710  Vitals shown include unvalidated device data.  Last Pain:  Vitals:   08/27/22 1155  TempSrc:   PainSc: 0-No pain         Complications:  Encounter Notable Events  Notable Event Outcome Phase Comment  Difficult to intubate - expected  Intraprocedure Filed from anesthesia note documentation.

## 2022-08-27 NOTE — Anesthesia Postprocedure Evaluation (Signed)
Anesthesia Post Note  Patient: Shari Hill  Procedure(s) Performed: Posterior Lumbar Interbody Fusion - Lumbar Five-Sacral One (Back)     Patient location during evaluation: PACU Anesthesia Type: General Level of consciousness: awake and alert, patient cooperative and oriented Pain management: pain level controlled Vital Signs Assessment: post-procedure vital signs reviewed and stable Respiratory status: spontaneous breathing, nonlabored ventilation and respiratory function stable Cardiovascular status: blood pressure returned to baseline and stable Postop Assessment: no apparent nausea or vomiting Anesthetic complications: yes   Encounter Notable Events  Notable Event Outcome Phase Comment  Difficult to intubate - expected  Intraprocedure Filed from anesthesia note documentation.    Last Vitals:  Vitals:   08/27/22 1735 08/27/22 1750  BP: (!) 146/75 (!) 143/67  Pulse: 71 66  Resp: 16 14  Temp:    SpO2: 98% 98%    Last Pain:  Vitals:   08/27/22 1750  TempSrc:   PainSc: Asleep                 Jedrek Dinovo,E. Julion Gatt

## 2022-08-28 DIAGNOSIS — M4317 Spondylolisthesis, lumbosacral region: Secondary | ICD-10-CM | POA: Diagnosis not present

## 2022-08-28 LAB — GLUCOSE, CAPILLARY
Glucose-Capillary: 172 mg/dL — ABNORMAL HIGH (ref 70–99)
Glucose-Capillary: 164 mg/dL — ABNORMAL HIGH (ref 70–99)
Glucose-Capillary: 166 mg/dL — ABNORMAL HIGH (ref 70–99)

## 2022-08-28 MED ORDER — OXYCODONE-ACETAMINOPHEN 5-325 MG PO TABS: 1.0000 | ORAL_TABLET | Freq: Four times a day (QID) | ORAL | 0 refills | Status: DC | PRN

## 2022-08-28 MED ORDER — OXYCODONE-ACETAMINOPHEN 5-325 MG PO TABS
1.0000 | ORAL_TABLET | Freq: Four times a day (QID) | ORAL | Status: DC | PRN
  Administered 2022-08-28: 2 via ORAL
  Filled 2022-08-28: qty 2

## 2022-08-28 MED ORDER — CYCLOBENZAPRINE HCL 10 MG PO TABS: 10.0000 mg | ORAL_TABLET | Freq: Three times a day (TID) | ORAL | 2 refills | Status: DC | PRN

## 2022-08-28 MED ORDER — DOCUSATE SODIUM 100 MG PO CAPS: 100.0000 mg | ORAL_CAPSULE | Freq: Two times a day (BID) | ORAL | 2 refills | Status: DC

## 2022-08-28 MED ORDER — DIPHENHYDRAMINE HCL 25 MG PO CAPS: 25.0000 mg | ORAL_CAPSULE | Freq: Four times a day (QID) | ORAL | Status: DC | PRN

## 2022-08-28 MED ORDER — DIPHENHYDRAMINE HCL 25 MG PO CAPS
25.0000 mg | ORAL_CAPSULE | Freq: Once | ORAL | Status: AC
  Administered 2022-08-28: 25 mg via ORAL
  Filled 2022-08-28: qty 1

## 2022-08-28 NOTE — Discharge Instructions (Addendum)
Can remove transparent dressing and shower 72 hours after surgery Walk as much as possible No heavy lifting >10 lbs No excessive bending/twisting at the waist Resume aspirin on 08/30/21

## 2022-08-28 NOTE — Progress Notes (Signed)
PT EVALUATION: Pt was only able to reposition in bed with me because she had an episode of nausea.  RN made aware that we did not get to the things we needed to check off to d/c home right now.  PT will check back later to see if she is feeling better and can practice stairs prior to d/c, hopefully today.  08/28/22 1352  PT Visit Information  Last PT Received On 08/28/22  Assistance Needed +1  History of Present Illness Shari Hill is a 67 y.o. female who underwent elective L5-S1 TLIF on 08/27/22.Pt with other significant PMH of asthma, DM, HTN, Rosai-Dorfman disease, L partial knee replacement, L RTC repair.  Precautions  Precautions Back (Simultaneous filing. User may not have seen previous data.)  Precaution Booklet Issued Yes (comment)  Precaution Comments verbally reviewed back precautions with pt and husband as well as positioning for sleep and log roll for in/out of bed.  Required Braces or Orthoses Spinal Brace (Simultaneous filing. User may not have seen previous data.)  Spinal Brace Applied in sitting position;Lumbar corset (Simultaneous filing. User may not have seen previous data.)  Restrictions  Weight Bearing Restrictions No  Home Living  Family/patient expects to be discharged to: Private residence (Simultaneous filing. User may not have seen previous data.)  Living Arrangements Spouse/significant other (Simultaneous filing. User may not have seen previous data.)  Available Help at Discharge Family (Simultaneous filing. User may not have seen previous data.)  Type of Home House (Simultaneous filing. User may not have seen previous data.)  Home Access Stairs to enter (Simultaneous filing. User may not have seen previous data.)  Entrance Stairs-Number of Steps 2 (Simultaneous filing. User may not have seen previous data.)  Entrance Stairs-Rails Left (Simultaneous filing. User may not have seen previous data.)  Home Layout One level (Simultaneous filing. User may not have  seen previous data.)  Futures trader seat  Prior Function  Prior Level of Function  Independent/Modified Independent (Simultaneous filing. User may not have seen previous data.)  Mobility Comments pt reports she was not using a RW before.  Communication  Communication No difficulties (Simultaneous filing. User may not have seen previous data.)  Pain Assessment  Pain Assessment Faces (Simultaneous filing. User may not have seen previous data.)  Faces Pain Scale 6 (Simultaneous filing. User may not have seen previous data.)  Pain Location incisional (Simultaneous filing. User may not have seen previous data.)  Pain Descriptors / Indicators Aching (Simultaneous filing. User may not have seen previous data.)  Pain Intervention(s) Limited activity within patient's tolerance;Monitored during session;Repositioned  Cognition  Arousal/Alertness Awake/alert (Simultaneous filing. User may not have seen previous data.)  Behavior During Therapy [facility] for tasks assessed/performed (Simultaneous filing. User may not have seen previous data.)  Overall Cognitive Status Within Functional Limits for tasks assessed (Simultaneous filing. User may not have seen previous data.)  Upper Extremity Assessment  Upper Extremity Assessment Defer to OT evaluation (Simultaneous filing. User may not have seen previous data.)  Lower Extremity Assessment  Lower Extremity Assessment RLE deficits/detail;LLE deficits/detail (Simultaneous filing. User may not have seen previous data.)  RLE Deficits / Details right leg was more painful PTA, currently she is still having some pain into her R leg.  Per gross functional assessment both legs appear equal with stand and side step.  LLE Deficits / Details right leg was more painful PTA, currently she is still having some pain into her R leg. Per gross functional assessment  both legs appear equal with stand and side step.  Cervical /  Trunk Assessment  Cervical / Trunk Assessment Back Surgery  Bed Mobility  Overal bed mobility Needs Assistance (Simultaneous filing. User may not have seen previous data.)  Bed Mobility Sit to Sidelying (Simultaneous filing. User may not have seen previous data.)  Rolling Min guard  Sidelying to sit Min assist;Min guard  Sit to sidelying Mod assist  General bed mobility comments Mod assist to help lift both legs back into bed together.  Verbal cues for reverse log roll technique. (Simultaneous filing. User may not have seen previous data.)  Transfers  Overall transfer level Needs assistance (Simultaneous filing. User may not have seen previous data.)  Equipment used 1 person hand held assist (Simultaneous filing. User may not have seen previous data.)  Transfers Sit to/from Stand (Simultaneous filing. User may not have seen previous data.)  Sit to Stand Min assist  General transfer comment Min assist to come to stand at EOB to take some side steps to [facility] so that pt could lay back down.  She was nauseated and could not get up and work with me at the moment.  her hemovac drain was pulled and it triggered some nausea. RN to bring nausea meds.  Ambulation/Gait  General Gait Details not able to preform at this time, limited by nausea.  Balance  Overall balance assessment Needs assistance (Simultaneous filing. User may not have seen previous data.)  Sitting-balance support Feet supported;No upper extremity supported  Sitting balance-Leahy Scale Good  Standing balance support Single extremity supported  Standing balance-Leahy Scale Poor  Standing balance comment min assist in standing EOB without AD  General Comments  General comments (skin integrity, edema, etc.) Pt hopeful to go home, however, nausea limiting her ability to participate with me right now.  Will come back.  PT - End of Session  Activity Tolerance Patient limited by pain;Other (comment) (limited by nausea)  Patient left  in bed;with call bell/phone within reach;with family/visitor present  Nurse Communication Mobility status  PT Assessment  PT Recommendation/Assessment Patient needs continued PT services  PT Visit Diagnosis Muscle weakness (generalized) (M62.81);Difficulty in walking, not elsewhere classified (R26.2);Pain  Pain - Right/Left Right  Pain - part of body Leg (and low back)  PT Problem List Decreased strength;Decreased activity tolerance;Decreased balance;Decreased mobility;Decreased knowledge of use of DME;Decreased knowledge of precautions;Pain  PT Plan  PT Frequency (ACUTE ONLY) Min 5X/week  PT Treatment/Interventions (ACUTE ONLY) DME instruction;Gait training;Stair training;Functional mobility training;Therapeutic exercise;Therapeutic activities;Balance training;Patient/family education;Modalities  AM-PAC PT "6 Clicks" Mobility Outcome Measure (Version 2)  Help needed turning from your back to your side while in a flat bed without using bedrails? 3  Help needed moving from lying on your back to sitting on the side of a flat bed without using bedrails? 3  Help needed moving to and from a bed to a chair (including a wheelchair)? 3  Help needed standing up from a chair using your arms (e.g., wheelchair or bedside chair)? 3  Help needed to walk in hospital room? 1  Help needed climbing 3-5 steps with a railing?  1  6 Click Score 14  Consider Recommendation of Discharge To: CIR/SNF/LTACH  Progressive Mobility  What is the highest level of mobility based on the progressive mobility assessment? Level 3 (Stands with assist) - Balance while standing  and cannot march in place  Mobility Referral Yes  Activity Stood at bedside  PT Recommendation  Follow Up Recommendations No PT follow  up  Assistance recommended at discharge Intermittent Supervision/Assistance  Patient can return home with the following A little help with walking and/or transfers;Help with stairs or ramp for entrance;A little help  with bathing/dressing/bathroom;Two people to help with walking and/or transfers  Functional Status Assessment Patient has had a recent decline in their functional status and demonstrates the ability to make significant improvements in function in a reasonable and predictable amount of time.  PT equipment Rolling walker (2 wheels)  Individuals Consulted  Consulted and Agree with Results and Recommendations Patient;Family member/caregiver  Family Member Consulted husband  Acute Rehab PT Goals  Patient Stated Goal to go home today if she can  PT Goal Formulation With patient/family  Time For Goal Achievement 09/11/22  Potential to Achieve Goals Good  PT Time Calculation  PT Start Time (ACUTE ONLY) 1315  PT Stop Time (ACUTE ONLY) 1337  PT Time Calculation (min) (ACUTE ONLY) 22 min  PT General Charges  $$ ACUTE PT VISIT 1 Visit  PT Evaluation  $PT Eval Moderate Complexity 1 Mod   Verdene Lennert, PT, DPT  Acute Rehabilitation Secure chat is best for contact #[phone] office

## 2022-08-28 NOTE — TOC Transition Note (Signed)
Transition of Care [facility]) - CM/SW Discharge Note   Patient Details  Name: Shaquaya Wuellner MRN: [id number] Date of Birth: 07/25/1956  [facility]) CM/SW Contact:  Bartholomew Crews, RN Phone Number: [phone] 08/28/2022, 3:52 PM   Clinical Narrative:     Spoke with patient on her cell phone to discuss post acute transition. PT recommended RW. Discussed potential wait for delivery to the room. Patient stated that she is ready to leave. Patient stated that she will order RW from [place] with next day delivery. No further TOC needs identified.   Final next level of care: Home/Self Care Barriers to Discharge: No Barriers Identified   Patient Goals and CMS Choice      Discharge Placement                         Discharge Plan and Services Additional resources added to the After Visit Summary for                                       Social Determinants of Health (SDOH) Interventions SDOH Screenings   Food Insecurity: No Food Insecurity (08/28/2022)  Housing: Low Risk  (08/28/2022)  Transportation Needs: No Transportation Needs (08/28/2022)  Utilities: Not At Risk (08/28/2022)  Tobacco Use: Low Risk  (08/27/2022)     Readmission Risk Interventions     No data to display

## 2022-08-28 NOTE — Progress Notes (Signed)
Physical Therapy Treatment Patient Details Name: Shari Hill MRN: [id number] DOB: Jun 14, 1956 Today's Date: 08/28/2022   History of Present Illness Shari Hill is a 67 y.o. female who underwent elective L5-S1 TLIF on 08/27/22.Pt with other significant PMH of asthma, DM, HTN, Rosai-Dorfman disease, L partial knee replacement, L RTC repair.    PT Comments    Pt nausea passed and she was able to go up and down stairs simulating home entry and walk all the way back from the gym to her room with min guard assist and RW.  Husband present throughout session and back handout given and reviewed.  PT to follow acutely until d/c confirmed.     Recommendations for follow up therapy are one component of a multi-disciplinary discharge planning process, led by the attending physician.  Recommendations may be updated based on patient status, additional functional criteria and insurance authorization.  Follow Up Recommendations  No PT follow up     Assistance Recommended at Discharge Intermittent Supervision/Assistance  Patient can return home with the following A little help with walking and/or transfers;Help with stairs or ramp for entrance;A little help with bathing/dressing/bathroom;Two people to help with walking and/or transfers   Equipment Recommendations  Other (comment) (pt did not want to wait on RW per CM note)    Recommendations for Other Services       Precautions / Restrictions Precautions Precautions: Back Precaution Booklet Issued: Yes (comment) Precaution Comments: provided pt with handout on back precautions and reviewed during functional mobility Required Braces or Orthoses: Spinal Brace Spinal Brace: Applied in sitting position;Lumbar corset Restrictions Weight Bearing Restrictions: No     Mobility  Bed Mobility Overal bed mobility: Needs Assistance Bed Mobility: Sit to Sidelying         Sit to sidelying: Mod assist General bed mobility comments: Pt was OOB with brace  donned sitting in recliner chair.    Transfers Overall transfer level: Needs assistance Equipment used: Rolling walker (2 wheels) Transfers: Sit to/from Stand Sit to Stand: Min assist           General transfer comment: Min assist to come to standing from recliner multiple times up to the RW.    Ambulation/Gait Ambulation/Gait assistance: Min guard Gait Distance (Feet): 250 Feet Assistive device: Rolling walker (2 wheels) Gait Pattern/deviations: Step-through pattern, Shuffle, Trunk flexed Gait velocity: decreased Gait velocity interpretation: 1.31 - 2.62 ft/sec, indicative of limited community ambulator   General Gait Details: cues for upright posture, slow, guarded speed.   Stairs Stairs: Yes Stairs assistance: Min assist Stair Management: One rail Left, Step to pattern (one rail on L husband on R providing hand held assist) Number of Stairs: 2 General stair comments: cues for safety, pt always leads up with R leg despite it being the more irritated side from back pain, L knee had partial TKA, so she has gotten used to using her R leg more.  Simulated home set up with one L rail and husband states it is wide enough that he can stand beside her and provide a hand, so that is how we practiced.  No issues.   Wheelchair Mobility    Modified Rankin (Stroke Patients Only)       Balance Overall balance assessment: Needs assistance Sitting-balance support: Feet supported, Bilateral upper extremity supported Sitting balance-Leahy Scale: Fair     Standing balance support: Bilateral upper extremity supported Standing balance-Leahy Scale: Poor Standing balance comment: reliant on UE support on RW  Cognition Arousal/Alertness: Awake/alert Behavior During Therapy: WFL for tasks assessed/performed Overall Cognitive Status: Within Functional Limits for tasks assessed                                          Exercises       General Comments General comments (skin integrity, edema, etc.): Pt hopeful to go home, however, nausea limiting her ability to participate with me right now.  Will come back.      Pertinent Vitals/Pain Pain Assessment Pain Assessment: Faces Faces Pain Scale: Hurts even more Pain Location: incisional Pain Descriptors / Indicators: Aching Pain Intervention(s): Limited activity within patient's tolerance, Monitored during session, Repositioned    Home Living Family/patient expects to be discharged to:: Private residence Living Arrangements: Spouse/significant other Available Help at Discharge: Family Type of Home: House Home Access: Stairs to enter Entrance Stairs-Rails: Left Entrance Stairs-Number of Steps: 2   Home Layout: One level Home Equipment: Shower seat      Prior Function            PT Goals (current goals can now be found in the care plan section) Acute Rehab PT Goals Patient Stated Goal: to go home today if she can PT Goal Formulation: With patient/family Time For Goal Achievement: 09/11/22 Potential to Achieve Goals: Good Progress towards PT goals: Progressing toward goals    Frequency    Min 5X/week      PT Plan Current plan remains appropriate    Co-evaluation              AM-PAC PT "6 Clicks" Mobility   Outcome Measure  Help needed turning from your back to your side while in a flat bed without using bedrails?: A Little Help needed moving from lying on your back to sitting on the side of a flat bed without using bedrails?: A Little Help needed moving to and from a bed to a chair (including a wheelchair)?: A Little Help needed standing up from a chair using your arms (e.g., wheelchair or bedside chair)?: A Little Help needed to walk in hospital room?: A Little Help needed climbing 3-5 steps with a railing? : A Little 6 Click Score: 18    End of Session Equipment Utilized During Treatment: Back brace Activity Tolerance: Patient  limited by pain Patient left: in chair;with call bell/phone within reach;with family/visitor present Nurse Communication: Mobility status PT Visit Diagnosis: Muscle weakness (generalized) (M62.81);Difficulty in walking, not elsewhere classified (R26.2);Pain Pain - Right/Left: Right Pain - part of body: Leg (and back)     Time: 9379-0240 PT Time Calculation (min) (ACUTE ONLY): 23 min  Charges:  $Gait Training: 23-37 mins                     Verdene Lennert, PT, DPT  Acute Rehabilitation Secure chat is best for contact #[phone] office    08/28/2022, 4:38 PM

## 2022-08-28 NOTE — Evaluation (Signed)
Occupational Therapy Evaluation Patient Details Name: Shari Hill MRN: [id number] DOB: [birth year] Today's Date: 08/28/2022   History of Present Illness Shari Hill is a 67 y.o. female who underwent elective L5-S1 TLIF.   Clinical Impression   Pt admitted with the above diagnoses and presents with below problem list. Pt will benefit from continued acute OT to address the below listed deficits and maximize independence with basic ADLs prior to d/c home. AT baseline, pt is independent with ADLs. Pt currently needs supervision to min guard assist with functional transfers and mobility, up to min A with bed mobility and LB ADLs. Spouse present throughout session and able to assist at home.       Recommendations for follow up therapy are one component of a multi-disciplinary discharge planning process, led by the attending physician.  Recommendations may be updated based on patient status, additional functional criteria and insurance authorization.   Follow Up Recommendations  No OT follow up     Assistance Recommended at Discharge Intermittent Supervision/Assistance  Patient can return home with the following A little help with bathing/dressing/bathroom    Functional Status Assessment     Equipment Recommendations  BSC/3in1    Recommendations for Other Services       Precautions / Restrictions Precautions Precautions: Back Precaution Booklet Issued: Yes (comment) Required Braces or Orthoses: Spinal Brace Spinal Brace: Applied in sitting position;Lumbar corset Restrictions Weight Bearing Restrictions: No      Mobility Bed Mobility Overal bed mobility: Needs Assistance Bed Mobility: Rolling, Sidelying to Sit Rolling: Min guard Sidelying to sit: Min assist, Min guard       General bed mobility comments: Pt reaching for therapist arm to faciliate power up to EOB.    Transfers Overall transfer level: Needs assistance Equipment used: Rolling walker (2 wheels) Transfers:  Sit to/from Stand Sit to Stand: Min guard                  Balance Overall balance assessment: Mild deficits observed, not formally tested                                         ADL either performed or assessed with clinical judgement   ADL Overall ADL's : Needs assistance/impaired Eating/Feeding: Set up;Sitting   Grooming: Set up;Sitting;Standing;Supervision/safety   Upper Body Bathing: Set up;Sitting   Lower Body Bathing: Min guard;Minimal assistance;Sit to/from stand   Upper Body Dressing : Set up;Sitting   Lower Body Dressing: Min guard;Minimal assistance;Sit to/from stand   Toilet Transfer: Min guard;Ambulation;Supervision/safety   Toileting- Clothing Manipulation and Hygiene: Supervision/safety;Min guard;Sit to/from stand   Tub/ Shower Transfer: Walk-in shower;Min guard;Ambulation;BSC/3in1;Rolling walker (2 wheels)   Functional mobility during ADLs: Supervision/safety;Min guard;Rolling walker (2 wheels)       Vision         Perception     Praxis      Pertinent Vitals/Pain Pain Assessment Pain Assessment: Faces Faces Pain Scale: Hurts even more Pain Location: back Pain Descriptors / Indicators: Discomfort, Grimacing, Guarding Pain Intervention(s): Limited activity within patient's tolerance, Monitored during session, Repositioned     Hand Dominance     Extremity/Trunk Assessment Upper Extremity Assessment Upper Extremity Assessment: Overall WFL for tasks assessed   Lower Extremity Assessment Lower Extremity Assessment: Defer to PT evaluation       Communication Communication Communication: No difficulties   Cognition Arousal/Alertness: Awake/alert Behavior During Therapy: [facility] for tasks  assessed/performed Overall Cognitive Status: Within Functional Limits for tasks assessed                                       General Comments  Spouse present and included in education    Exercises     Shoulder  Instructions      Home Living Family/patient expects to be discharged to:: Private residence Living Arrangements: Spouse/significant other Available Help at Discharge: Family Type of Home: House Home Access: Stairs to enter [employer] of Steps: 2 steps, garage entrance, rail on left side, wall on R side Entrance Stairs-Rails: Left Home Layout: Two level;Able to live on main level with bedroom/bathroom     Bathroom Shower/Tub: Walk-in shower         Home Equipment: Shower seat          Prior Functioning/Environment Prior Level of Function : Independent/Modified Independent                        OT Problem List: Impaired balance (sitting and/or standing);Decreased knowledge of use of DME or AE;Decreased knowledge of precautions;Pain      OT Treatment/Interventions: Self-care/ADL training;DME and/or AE instruction;Therapeutic activities;Patient/family education;Balance training    OT Goals(Current goals can be found in the care plan section) Acute Rehab OT Goals Patient Stated Goal: not stated OT Goal Formulation: With patient/family Time For Goal Achievement: 09/11/22 Potential to Achieve Goals: Good ADL Goals Pt Will Perform Lower Body Bathing: with modified independence;sit to/from stand Pt Will Perform Lower Body Dressing: with modified independence;sit to/from stand Additional ADL Goal #1: Pt will complete bed mobility at mod I level to prepare for EOB/OOB ADLs.  OT Frequency: Min 2X/week    Co-evaluation              AM-PAC OT "6 Clicks" Daily Activity     Outcome Measure Help from another person eating meals?: None Help from another person taking care of personal grooming?: None Help from another person toileting, which includes using toliet, bedpan, or urinal?: A Little Help from another person bathing (including washing, rinsing, drying)?: None Help from another person to put on and taking off regular upper body clothing?: A  Little   6 Click Score: 18   End of Session Equipment Utilized During Treatment: Rolling walker (2 wheels)  Activity Tolerance: Patient tolerated treatment well Patient left: in chair;with call bell/phone within reach;with family/visitor present  OT Visit Diagnosis: Unsteadiness on feet (R26.81);Pain                Time: 0932-6712 OT Time Calculation (min): 22 min Charges:  OT General Charges $OT Visit: 1 Visit OT Evaluation $OT Eval Low Complexity: [place], OT Acute Rehabilitation Services Office: [phone]   Hortencia Pilar 08/28/2022, 2:01 PM

## 2022-08-28 NOTE — Discharge Summary (Signed)
Physician Discharge Summary  Patient ID: Shari Hill MRN: [id number] DOB/AGE: 02-13-1956 67 y.o.  Admit date: 08/27/2022 Discharge date: 08/28/2022  Admission Diagnoses:  L5-S1 spondylolisthesis  Discharge Diagnoses:  Same Principal Problem:   Spondylolisthesis at L5-S1 level   Discharged Condition: Stable  Hospital Course:  Shari Hill is a 67 y.o. female who underwent elective L5-S1 TLIF.  Postoperatively, she was admitted to the hospital where she was mobilized with help of physical therapy.  Her pain was well-controlled p.o. pain medications.  She was tolerating a regular diet.  She was deemed ready for discharge home on 08/28/2022.  Treatments: Surgery - L5-S1 TLIF  Discharge Exam: Blood pressure 111/69, pulse 69, temperature 98.1 F (36.7 C), temperature source Oral, resp. rate 18, height '5\' 3"'$  (1.6 m), weight 106.6 kg, SpO2 96 %. Awake, alert, oriented Speech fluent, appropriate CN grossly intact 5/5 BUE/BLE Wound c/d/i  Disposition: Discharge disposition: 01-Home or Self Care        Allergies as of 08/28/2022       Reactions   Zocor [simvastatin] Other (See Comments)   Unknown reaction per pt   Latex Hives, Rash        Medication List     TAKE these medications    acetaminophen 500 MG tablet Commonly known as: TYLENOL Take 500-1,000 mg by mouth every 6 (six) hours as needed (pain.).   albuterol 108 (90 Base) MCG/ACT inhaler Commonly known as: VENTOLIN HFA Inhale into the lungs every 6 (six) hours as needed for wheezing or shortness of breath.   amLODipine 10 MG tablet Commonly known as: NORVASC Take 10 mg by mouth every evening.   aspirin 81 MG chewable tablet Chew 81 mg by mouth every evening.   atorvastatin 20 MG tablet Commonly known as: LIPITOR Take 20 mg by mouth every evening.   Azelastine HCl 137 MCG/SPRAY Soln Place 1 spray into both nostrils in the morning and at bedtime.   budesonide-formoterol 160-4.5 MCG/ACT inhaler Commonly  known as: SYMBICORT Inhale 2 puffs into the lungs 2 (two) times daily as needed (respiratory issues.).   cyclobenzaprine 10 MG tablet Commonly known as: FLEXERIL Take 1 tablet (10 mg total) by mouth 3 (three) times daily as needed for muscle spasms.   docusate sodium 100 MG capsule Commonly known as: Colace Take 1 capsule (100 mg total) by mouth 2 (two) times daily.   empagliflozin 10 MG Tabs tablet Commonly known as: JARDIANCE Take 10 mg by mouth in the morning.   EPINEPHrine 0.3 mg/0.3 mL Soaj injection Commonly known as: EPI-PEN Inject 0.3 mg into the muscle as needed for anaphylaxis.   glipiZIDE 5 MG 24 hr tablet Commonly known as: GLUCOTROL XL Take 5 mg by mouth in the morning.   hydrochlorothiazide 25 MG tablet Commonly known as: HYDRODIURIL Take 25 mg by mouth in the morning.   Janumet XR [phone] MG Tb24 Generic drug: SitaGLIPtin-MetFORMIN HCl Take 1 tablet by mouth every evening.   levocetirizine 5 MG tablet Commonly known as: XYZAL Take 5 mg by mouth every evening.   lisinopril 40 MG tablet Commonly known as: ZESTRIL Take 40 mg by mouth in the morning.   methocarbamol 500 MG tablet Commonly known as: ROBAXIN Take 500 mg by mouth 3 (three) times daily as needed for muscle spasms.   metoprolol succinate 50 MG 24 hr tablet Commonly known as: TOPROL-XL Take 50 mg by mouth every evening.   NON FORMULARY Inject 1 Dose as directed once a week. Allergy Shot   oxyCODONE-acetaminophen  5-325 MG tablet Commonly known as: Percocet Take 1-2 tablets by mouth every 6 (six) hours as needed for severe pain.   Pataday 0.2 % Soln Generic drug: Olopatadine HCl Place 1 drop into both eyes 2 (two) times daily as needed (allergy eyes/irritation.).        Follow-up Information     Kary Kos, MD Follow up in 2 week(s).   Specialty: Neurosurgery Contact information: [address] [place] [address] [phone]                  Signed: Vallarie Mare 08/28/2022, 10:45 AM

## 2022-08-31 ENCOUNTER — Telehealth: Payer: Self-pay

## 2022-08-31 ENCOUNTER — Encounter: Payer: Self-pay | Admitting: *Deleted

## 2022-08-31 ENCOUNTER — Other Ambulatory Visit (HOSPITAL_COMMUNITY): Payer: Self-pay

## 2022-08-31 ENCOUNTER — Other Ambulatory Visit: Payer: Self-pay | Admitting: *Deleted

## 2022-08-31 ENCOUNTER — Telehealth: Payer: Self-pay | Admitting: Pharmacist

## 2022-08-31 DIAGNOSIS — D763 Other histiocytosis syndromes: Secondary | ICD-10-CM

## 2022-08-31 MED ORDER — THALIDOMIDE 50 MG PO CAPS
50.0000 mg | ORAL_CAPSULE | Freq: Every day | ORAL | 0 refills | Status: DC
  Filled 2022-08-31: qty 20, 20d supply, fill #0

## 2022-08-31 NOTE — Telephone Encounter (Signed)
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 Planned start 09/13/22.   Prescription dose and frequency assessed.   Study: Milderd Meager, Pavlidakey P, Sami N. Rosai-Dorfman disease successfully treated with thalidomide. JAAD Case Rep. 2016;2(5):369-372. Published 2016 Sep 28. doi:10.1016/j.jdcr.2016.08.006  Current medication list in Epic reviewed, a few DDIs with thalidomide identified: CNS Depressants like levocetirizine, methocarbamol, and oxycodone may enhance the CNS depressant effect of Thalidomide. Monitor patient for increased sedation.   Evaluated chart and no patient barriers to medication adherence identified.   Prescription has been e-scribed to [place] for benefits analysis and approval.  Oral Oncology Clinic will continue to follow for insurance authorization, copayment issues, initial counseling and start date.   Darl Pikes, PharmD, BCPS, BCOP, CPP Hematology/Oncology Clinical Pharmacist Practitioner [place]/DB/AP [facility] [phone]  08/31/2022 1:46 PM

## 2022-08-31 NOTE — Progress Notes (Signed)
Per Dr. Garnette Scheuermann: start Thalidomide at 50 mg daily. Script sent to [facility] and patient notified via MyChart to complete her survey.

## 2022-08-31 NOTE — Telephone Encounter (Signed)
Oral Oncology Patient Advocate Encounter  After completing a benefits investigation, prior authorization for Thalomid is not required at this time through [employer].  Patient's copay is $43.     Berdine Addison, [facility] [place]  [phone] (phone) [phone] (fax) 08/31/2022 3:52 PM

## 2022-09-01 MED ORDER — THALIDOMIDE 50 MG PO CAPS: 50.0000 mg | ORAL_CAPSULE | Freq: Every day | ORAL | 0 refills | Status: DC

## 2022-09-01 NOTE — Telephone Encounter (Signed)
Oral Chemotherapy Pharmacist Encounter  Patient Education I spoke with patient for overview of new oral chemotherapy medication: Thalomid (thalidomide)  for the treatment of Rosai-Dorfman Disease, planned duration until disease progression or unacceptable drug toxicity.   Pt is doing well. Counseled patient on administration, dosing, side effects, monitoring, drug-food interactions, safe handling, storage, and disposal. Patient will take 1 capsule ('50mg'$ ) by mouth nightly.  Side effects include but not limited to:  - fatigue - fluid retention - constipation - numbness/tingling in hands and feet - rash - muscle pain/weakness -infection    Reviewed with patient importance of keeping a medication schedule and plan for any missed doses.  After discussion with patient there are no patient barriers to medication adherence identified.   Shari Hill voiced understanding and appreciation. All questions answered. Medication handout provided.  Provided patient with [facility] phone number. Patient knows to call the office with questions or concerns. Oral Chemotherapy [facility] will continue to follow.  Laray Anger, PharmD PGY-2 Pharmacy Resident Hematology/Oncology [phone]  09/01/2022 3:24 PM

## 2022-09-06 ENCOUNTER — Telehealth: Payer: Self-pay | Admitting: *Deleted

## 2022-09-06 NOTE — Telephone Encounter (Signed)
Per oral oncology pharmacist, taking the azelastine and Xyzal can increase drowsiness. So, if she needs them OK to take them. Can hold for excess sedation. Dr. Benay Spice agrees. Left this message on her voice mail.

## 2022-09-06 NOTE — Telephone Encounter (Signed)
Call from [employer] reporting they noted a drug interaction between her thalidomide and her Xyzal and azelastine nasal spray. Patient reported she is willing to stop both if MD wishes.

## 2022-09-10 ENCOUNTER — Telehealth: Payer: Self-pay | Admitting: *Deleted

## 2022-09-10 NOTE — Telephone Encounter (Signed)
Shari Hill left message that Dr. Saintclair Halsted today thinks she may have infection in back from surgery. Has put her on vicodin and an antibiotic. Was supposed to begin Thalidomide 09/13/22. Asking if there is any concern? Per Dr. Benay Spice: No concerns. OK to begin thalidomide as scheduled. Shari Hill notified OK to proceed w/thalidomide.

## 2022-09-13 ENCOUNTER — Other Ambulatory Visit (HOSPITAL_COMMUNITY): Payer: Self-pay

## 2022-09-17 ENCOUNTER — Encounter: Payer: Self-pay | Admitting: Oncology

## 2022-09-18 ENCOUNTER — Encounter: Payer: Self-pay | Admitting: Oncology

## 2022-09-20 ENCOUNTER — Telehealth: Payer: Self-pay | Admitting: *Deleted

## 2022-09-20 NOTE — Telephone Encounter (Signed)
Informed patient that Dr. Benay Spice does not think itching is related to the thalidomide. Call if breast area gets larger, more red or pain increases again. Can schedule her to be seen next week to assess. She would appreciate this.

## 2022-09-20 NOTE — Telephone Encounter (Signed)
Called patient regarding two Mychart messages she has sent with #2 issues: Cyst/bump at right breast started getting tender and red on 1/23 (started Thalomid 1/22). By day 2, area was very tender to touch and redness was extending out. Today it is less tender, but still redness extends out ~ 1/2 inch around it. No fever. Increase in itching of her back since she started the Thalomid--no rash. Has some itching and puffiness at her incision site and she has reached out to Dr. Saintclair Halsted about this. Was put on prednisone for itching and it has been completed, with no improvement in itching. Could the Thalomid be causing the itching?

## 2022-09-30 ENCOUNTER — Other Ambulatory Visit: Payer: Self-pay | Admitting: *Deleted

## 2022-09-30 ENCOUNTER — Inpatient Hospital Stay (HOSPITAL_BASED_OUTPATIENT_CLINIC_OR_DEPARTMENT_OTHER): Payer: Medicare Other | Admitting: Oncology

## 2022-09-30 ENCOUNTER — Inpatient Hospital Stay: Payer: Medicare Other | Attending: Oncology

## 2022-09-30 VITALS — BP 125/75 | HR 79 | Temp 98.2°F | Resp 18 | Ht 63.0 in | Wt 235.8 lb

## 2022-09-30 DIAGNOSIS — R911 Solitary pulmonary nodule: Secondary | ICD-10-CM | POA: Insufficient documentation

## 2022-09-30 DIAGNOSIS — E785 Hyperlipidemia, unspecified: Secondary | ICD-10-CM | POA: Insufficient documentation

## 2022-09-30 DIAGNOSIS — D763 Other histiocytosis syndromes: Secondary | ICD-10-CM

## 2022-09-30 DIAGNOSIS — R232 Flushing: Secondary | ICD-10-CM | POA: Insufficient documentation

## 2022-09-30 DIAGNOSIS — I10 Essential (primary) hypertension: Secondary | ICD-10-CM | POA: Insufficient documentation

## 2022-09-30 DIAGNOSIS — M549 Dorsalgia, unspecified: Secondary | ICD-10-CM | POA: Diagnosis not present

## 2022-09-30 DIAGNOSIS — E041 Nontoxic single thyroid nodule: Secondary | ICD-10-CM | POA: Diagnosis not present

## 2022-09-30 DIAGNOSIS — J45909 Unspecified asthma, uncomplicated: Secondary | ICD-10-CM | POA: Insufficient documentation

## 2022-09-30 DIAGNOSIS — E119 Type 2 diabetes mellitus without complications: Secondary | ICD-10-CM | POA: Diagnosis not present

## 2022-09-30 DIAGNOSIS — R200 Anesthesia of skin: Secondary | ICD-10-CM | POA: Diagnosis not present

## 2022-09-30 LAB — CBC WITH DIFFERENTIAL (CANCER CENTER ONLY)
Abs Immature Granulocytes: 0.06 10*3/uL (ref 0.00–0.07)
Basophils Absolute: 0 10*3/uL (ref 0.0–0.1)
Basophils Relative: 1 %
Eosinophils Absolute: 0.4 10*3/uL (ref 0.0–0.5)
Eosinophils Relative: 4 %
HCT: 38.7 % (ref 36.0–46.0)
Hemoglobin: 12.6 g/dL (ref 12.0–15.0)
Immature Granulocytes: 1 %
Lymphocytes Relative: 25 %
Lymphs Abs: 2.2 10*3/uL (ref 0.7–4.0)
MCH: 27.8 pg (ref 26.0–34.0)
MCHC: 32.6 g/dL (ref 30.0–36.0)
MCV: 85.4 fL (ref 80.0–100.0)
Monocytes Absolute: 1.2 10*3/uL — ABNORMAL HIGH (ref 0.1–1.0)
Monocytes Relative: 14 %
Neutro Abs: 4.8 10*3/uL (ref 1.7–7.7)
Neutrophils Relative %: 55 %
Platelet Count: 226 10*3/uL (ref 150–400)
RBC: 4.53 MIL/uL (ref 3.87–5.11)
RDW: 15.9 % — ABNORMAL HIGH (ref 11.5–15.5)
WBC Count: 8.6 10*3/uL (ref 4.0–10.5)
nRBC: 0 % (ref 0.0–0.2)

## 2022-09-30 LAB — CMP (CANCER CENTER ONLY)
ALT: 12 U/L (ref 0–44)
AST: 10 U/L — ABNORMAL LOW (ref 15–41)
Albumin: 4.3 g/dL (ref 3.5–5.0)
Alkaline Phosphatase: 87 U/L (ref 38–126)
Anion gap: 12 (ref 5–15)
BUN: 25 mg/dL — ABNORMAL HIGH (ref 8–23)
CO2: 28 mmol/L (ref 22–32)
Calcium: 9.6 mg/dL (ref 8.9–10.3)
Chloride: 104 mmol/L (ref 98–111)
Creatinine: 1.21 mg/dL — ABNORMAL HIGH (ref 0.44–1.00)
GFR, Estimated: 49 mL/min — ABNORMAL LOW (ref >60)
Glucose, Bld: 141 mg/dL — ABNORMAL HIGH (ref 70–99)
Potassium: 3.7 mmol/L (ref 3.5–5.1)
Sodium: 144 mmol/L (ref 135–145)
Total Bilirubin: 0.4 mg/dL (ref 0.3–1.2)
Total Protein: 6.9 g/dL (ref 6.5–8.1)

## 2022-09-30 MED ORDER — THALIDOMIDE 50 MG PO CAPS: 50.0000 mg | ORAL_CAPSULE | Freq: Every day | ORAL | 0 refills | Status: DC

## 2022-09-30 NOTE — Telephone Encounter (Signed)
Faxed notification for refill on Thalidomide received.

## 2022-09-30 NOTE — Progress Notes (Signed)
[facility] OFFICE PROGRESS NOTE   Diagnosis: Rosai-Dorfman syndrome  INTERVAL HISTORY:   Shari Hill returns as scheduled.  She underwent spine surgery on 08/27/2022.  She reports increased back pain following surgery.  She is scheduled for a CT later this week. She began thalidomide 09/13/2022.  She reports intermittent hot flashes and frontal headaches.  She has numbness in the legs following surgery.  No symptom of venous thrombosis.  She believes the lesions at the left thigh and buttock have decreased.  A cyst inferior to the right breast drained.  Objective:  Vital signs in last 24 hours:  Blood pressure 125/75, pulse 79, temperature 98.2 F (36.8 C), temperature source Oral, resp. rate 18, height '5\' 3"'$  (1.6 m), weight 235 lb 12.8 oz (107 kg), SpO2 100 %.    Resp: Lungs clear bilaterally Cardio: Regular rate and rhythm Vascular: Reports stockings in place at the lower leg bilaterally  Skin: Incision of the lower back has healed.  Left anterior thigh lesion measures approximately 1.5 x 2 cm and appears flatter compared to when I saw her 08/20/2022, 3 x 3 cm lesion at the left buttock with induration and superficial scaling, lesion inferior to the right breast with an open central area and surrounding induration, inclusion cyst at the mid anterior chest    Lab Results:  Lab Results  Component Value Date   WBC 8.6 09/30/2022   HGB 12.6 09/30/2022   HCT 38.7 09/30/2022   MCV 85.4 09/30/2022   PLT 226 09/30/2022   NEUTROABS 4.8 09/30/2022    CMP  Lab Results  Component Value Date   NA 144 09/30/2022   K 3.7 09/30/2022   CL 104 09/30/2022   CO2 28 09/30/2022   GLUCOSE 141 (H) 09/30/2022   BUN 25 (H) 09/30/2022   CREATININE 1.21 (H) 09/30/2022   CALCIUM 9.6 09/30/2022   PROT 6.9 09/30/2022   ALBUMIN 4.3 09/30/2022   AST 10 (L) 09/30/2022   ALT 12 09/30/2022   ALKPHOS 87 09/30/2022   BILITOT 0.4 09/30/2022   GFRNONAA 49 (L) 09/30/2022      Medications: I have reviewed the patient's current medications.   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 PET October 2013-numerous FDG avid subdermal/subcutaneous nodules and bone lesions Prednisone October 2013 with initial response, tapered until February 2014 when skin lesions enlarged Rituximab 4 weekly doses beginning, followed by rituximab maintenance with completion of 2 years January 2016 Disease progression July 2019-no response to rituximab or Gleevec Revlimid/Decadron August 2019-partial response 01/28/2022-enlarging cutaneous lesion left anterior thigh, possible small similar lesions at the left abdomen and left lower leg PET 03/11/2022-heterogenous mildly enlarged right thyroid with calcification, cutaneous left anterior thigh lesion with moderate metabolic activity, cutaneous lesion at the mid anterior chest-nonspecific, left upper lobe pulmonary nodule with low-level FDG activity less than mediastinal blood pool-nonspecific Thalidomide beginning 09/13/2022 Diabetes Hypertension Hyperlipidemia Asthma/seasonal allergies Left lung nodule on PET 03/11/2022 CT chest 07/12/2022-unchanged left upper lobe nodule, no new suspicious nodules or masses heterogenous enlargement of the thyroid 7.  Multinodular thyroid-2 right-sided nodules meet criteria for biopsy and a calcified nodule in the right mid thyroid meets criteria for surveillance imaging      Disposition: Shari Hill has Rosai-Dorfman syndrome.  She appears to have active lesions at the left thigh and left buttock.  She has been on thalidomide for the past 2 weeks.  No apparent new lesions.  The lesion at the left anterior thigh appears flatter.  The plan is to continue thalidomide. The indurated lesion inferior to the right breast is likely an inflamed sebaceous cyst.  She will keep this area clean.  We will make a surgical referral if the lesion progresses.  She will  follow-up with Dr. Saintclair Halsted for management of back pain.  Betsy Coder, MD  09/30/2022  10:18 AM

## 2022-10-07 ENCOUNTER — Encounter: Payer: Self-pay | Admitting: Oncology

## 2022-10-15 ENCOUNTER — Telehealth: Payer: Self-pay | Admitting: *Deleted

## 2022-10-15 NOTE — Telephone Encounter (Signed)
Notified by [facility] that they have not dispensed last script for Thalidomide and authorization number will expire soon.  Left VM for Shari Hill to call with status of her back issues and constipation and how she wishes to manage the Thalidomide?  Has video visit with Dr. Katina Dung on 2/28

## 2022-10-15 NOTE — Telephone Encounter (Signed)
Shari Hill left message that constipation is better w/MiraLax and mineral oil. Dr. Saintclair Halsted is working on getting an MRI of her back--think there could be fluid accumulation causing her pain. She has video visit with Dr. Garnette Scheuermann on 2/28 and will know what to do with her Thalomid at that time. Does not want to take it in April since she is going to [place] that month.

## 2022-10-20 ENCOUNTER — Telehealth: Payer: Self-pay | Admitting: *Deleted

## 2022-10-20 ENCOUNTER — Encounter: Payer: Self-pay | Admitting: *Deleted

## 2022-10-20 NOTE — Telephone Encounter (Signed)
MD reviewed her [facility] MRI report today and said findings are unrelated to her cancer diagnosis and treatment. Notified patient via VM/

## 2022-10-20 NOTE — Telephone Encounter (Signed)
Shari Hill left VM that she had visit with Dr. Garnette Scheuermann today, who said to continue the thalidomide for now. She is requesting we have script sent to her. Having MRI today at 1 pm. Called [facility] and was told by [name] that they need patient to call them [phone] issue with her medication.  Left Shari Hill a MyChart message with phone number to call to follow up.

## 2022-11-02 ENCOUNTER — Inpatient Hospital Stay: Payer: Medicare Other

## 2022-11-02 ENCOUNTER — Inpatient Hospital Stay: Payer: Medicare Other | Attending: Oncology | Admitting: Oncology

## 2022-11-02 VITALS — BP 131/62 | HR 71 | Temp 98.1°F | Resp 18 | Ht 63.0 in | Wt 239.0 lb

## 2022-11-02 DIAGNOSIS — E119 Type 2 diabetes mellitus without complications: Secondary | ICD-10-CM | POA: Insufficient documentation

## 2022-11-02 DIAGNOSIS — D763 Other histiocytosis syndromes: Secondary | ICD-10-CM | POA: Diagnosis present

## 2022-11-02 DIAGNOSIS — E785 Hyperlipidemia, unspecified: Secondary | ICD-10-CM | POA: Insufficient documentation

## 2022-11-02 DIAGNOSIS — J45909 Unspecified asthma, uncomplicated: Secondary | ICD-10-CM | POA: Diagnosis not present

## 2022-11-02 DIAGNOSIS — M549 Dorsalgia, unspecified: Secondary | ICD-10-CM | POA: Diagnosis not present

## 2022-11-02 DIAGNOSIS — I10 Essential (primary) hypertension: Secondary | ICD-10-CM | POA: Diagnosis not present

## 2022-11-02 DIAGNOSIS — R911 Solitary pulmonary nodule: Secondary | ICD-10-CM | POA: Diagnosis not present

## 2022-11-02 DIAGNOSIS — R519 Headache, unspecified: Secondary | ICD-10-CM | POA: Insufficient documentation

## 2022-11-02 LAB — CMP (CANCER CENTER ONLY)
ALT: 10 U/L (ref 0–44)
AST: 12 U/L — ABNORMAL LOW (ref 15–41)
Albumin: 4.4 g/dL (ref 3.5–5.0)
Alkaline Phosphatase: 79 U/L (ref 38–126)
Anion gap: 10 (ref 5–15)
BUN: 24 mg/dL — ABNORMAL HIGH (ref 8–23)
CO2: 27 mmol/L (ref 22–32)
Calcium: 9.9 mg/dL (ref 8.9–10.3)
Chloride: 103 mmol/L (ref 98–111)
Creatinine: 1.15 mg/dL — ABNORMAL HIGH (ref 0.44–1.00)
GFR, Estimated: 52 mL/min — ABNORMAL LOW (ref >60)
Glucose, Bld: 234 mg/dL — ABNORMAL HIGH (ref 70–99)
Potassium: 3.8 mmol/L (ref 3.5–5.1)
Sodium: 140 mmol/L (ref 135–145)
Total Bilirubin: 0.4 mg/dL (ref 0.3–1.2)
Total Protein: 7.2 g/dL (ref 6.5–8.1)

## 2022-11-02 LAB — CBC WITH DIFFERENTIAL (CANCER CENTER ONLY)
Abs Immature Granulocytes: 0.08 10*3/uL — ABNORMAL HIGH (ref 0.00–0.07)
Basophils Absolute: 0.1 10*3/uL (ref 0.0–0.1)
Basophils Relative: 1 %
Eosinophils Absolute: 0.4 10*3/uL (ref 0.0–0.5)
Eosinophils Relative: 5 %
HCT: 41.2 % (ref 36.0–46.0)
Hemoglobin: 13.3 g/dL (ref 12.0–15.0)
Immature Granulocytes: 1 %
Lymphocytes Relative: 29 %
Lymphs Abs: 2.6 10*3/uL (ref 0.7–4.0)
MCH: 27.3 pg (ref 26.0–34.0)
MCHC: 32.3 g/dL (ref 30.0–36.0)
MCV: 84.4 fL (ref 80.0–100.0)
Monocytes Absolute: 0.6 10*3/uL (ref 0.1–1.0)
Monocytes Relative: 7 %
Neutro Abs: 5.3 10*3/uL (ref 1.7–7.7)
Neutrophils Relative %: 57 %
Platelet Count: 228 10*3/uL (ref 150–400)
RBC: 4.88 MIL/uL (ref 3.87–5.11)
RDW: 16.5 % — ABNORMAL HIGH (ref 11.5–15.5)
WBC Count: 9 10*3/uL (ref 4.0–10.5)
nRBC: 0 % (ref 0.0–0.2)

## 2022-11-02 NOTE — Progress Notes (Signed)
[facility] OFFICE PROGRESS NOTE   Diagnosis: Rosai-Dorfman   INTERVAL HISTORY:   Shari Hill returns as scheduled.  She continues thalidomide.  No bleeding or symptom of thrombosis.  She feels the lesion at the left thigh and left buttock are regressing.  She continues to have back pain following low back surgery in January.  She reports receiving 2 "injections "last week.  The pain has not improved.  She has intermittent headaches relieved with Tylenol.  She does not have a headache daily.  She had a telehealth visit with Dr. Garnette Scheuermann on 10/20/2022.  Objective:  Vital signs in last 24 hours:  Blood pressure 131/62, pulse 71, temperature 98.1 F (36.7 C), temperature source Oral, resp. rate 18, height '5\' 3"'$  (1.6 m), weight 239 lb (108.4 kg), SpO2 100 %.     Lymphatics: No cervical, supraclavicular, axillary, or inguinal nodes, bilateral axillary hidradenitis Resp: Lungs clear bilaterally Cardio: Regular rate and rhythm GI: No hepatosplenomegaly Vascular: No leg edema  Skin: Occlusion cyst at the mid anterior chest, resolution of inflamed cystic area inferior to the right breast.  Soft slightly raised 1.5 cm lesion at the left anterior thigh, scaled indurated lesion at the upper left buttock measures 4 cm in transverse dimension and 3 cm vertically  Lab Results:  Lab Results  Component Value Date   WBC PENDING 11/02/2022   HGB 13.3 11/02/2022   HCT 41.2 11/02/2022   MCV 84.4 11/02/2022   PLT 228 11/02/2022   NEUTROABS PENDING 11/02/2022    CMP  Lab Results  Component Value Date   NA 144 09/30/2022   K 3.7 09/30/2022   CL 104 09/30/2022   CO2 28 09/30/2022   GLUCOSE 141 (H) 09/30/2022   BUN 25 (H) 09/30/2022   CREATININE 1.21 (H) 09/30/2022   CALCIUM 9.6 09/30/2022   PROT 6.9 09/30/2022   ALBUMIN 4.3 09/30/2022   AST 10 (L) 09/30/2022   ALT 12 09/30/2022   ALKPHOS 87 09/30/2022   BILITOT 0.4 09/30/2022   GFRNONAA 49 (L) 09/30/2022     Medications: I have reviewed the patient's current medications.   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 PET October 2013-numerous FDG avid subdermal/subcutaneous nodules and bone lesions Prednisone October 2013 with initial response, tapered until February 2014 when skin lesions enlarged Rituximab 4 weekly doses beginning, followed by rituximab maintenance with completion of 2 years January 2016 Disease progression July 2019-no response to rituximab or Gleevec Revlimid/Decadron August 2019-partial response 01/28/2022-enlarging cutaneous lesion left anterior thigh, possible small similar lesions at the left abdomen and left lower leg PET 03/11/2022-heterogenous mildly enlarged right thyroid with calcification, cutaneous left anterior thigh lesion with moderate metabolic activity, cutaneous lesion at the mid anterior chest-nonspecific, left upper lobe pulmonary nodule with low-level FDG activity less than mediastinal blood pool-nonspecific Thalidomide beginning 09/13/2022 Diabetes Hypertension Hyperlipidemia Asthma/seasonal allergies Left lung nodule on PET 03/11/2022 CT chest 07/12/2022-unchanged left upper lobe nodule, no new suspicious nodules or masses heterogenous enlargement of the thyroid 7.  Multinodular thyroid-2 right-sided nodules meet criteria for biopsy and a calcified nodule in the right mid thyroid meets criteria for surveillance imaging 8.  L5-S1 decompressive laminotomies 08/27/2022     Disposition: Ms. Harvell appears to be tolerating the thalidomide well.  It is unclear whether the mild intermittent headaches are related to thalidomide.  The left anterior thigh lesion appears to be regressing.  The left upper buttock lesion is stable.  She will continue thalidomide.  Ms.  Nails will return for an office and lab visit in 4-5 weeks.  Betsy Coder, MD  11/02/2022  11:32 AM

## 2022-11-12 ENCOUNTER — Other Ambulatory Visit: Payer: Self-pay | Admitting: *Deleted

## 2022-11-12 DIAGNOSIS — D763 Other histiocytosis syndromes: Secondary | ICD-10-CM

## 2022-11-12 MED ORDER — THALIDOMIDE 50 MG PO CAPS: 50.0000 mg | ORAL_CAPSULE | Freq: Every day | ORAL | 0 refills | Status: DC

## 2022-12-05 ENCOUNTER — Encounter: Payer: Self-pay | Admitting: Oncology

## 2022-12-06 ENCOUNTER — Inpatient Hospital Stay: Payer: Medicare Other | Attending: Oncology

## 2022-12-06 ENCOUNTER — Inpatient Hospital Stay (HOSPITAL_BASED_OUTPATIENT_CLINIC_OR_DEPARTMENT_OTHER): Payer: Medicare Other | Admitting: Oncology

## 2022-12-06 VITALS — BP 124/80 | HR 68 | Temp 98.1°F | Resp 18 | Ht 63.0 in | Wt 242.2 lb

## 2022-12-06 DIAGNOSIS — J45909 Unspecified asthma, uncomplicated: Secondary | ICD-10-CM | POA: Diagnosis not present

## 2022-12-06 DIAGNOSIS — Z79899 Other long term (current) drug therapy: Secondary | ICD-10-CM | POA: Diagnosis not present

## 2022-12-06 DIAGNOSIS — D763 Other histiocytosis syndromes: Secondary | ICD-10-CM | POA: Insufficient documentation

## 2022-12-06 DIAGNOSIS — E119 Type 2 diabetes mellitus without complications: Secondary | ICD-10-CM | POA: Insufficient documentation

## 2022-12-06 DIAGNOSIS — I10 Essential (primary) hypertension: Secondary | ICD-10-CM | POA: Insufficient documentation

## 2022-12-06 DIAGNOSIS — R911 Solitary pulmonary nodule: Secondary | ICD-10-CM

## 2022-12-06 DIAGNOSIS — E785 Hyperlipidemia, unspecified: Secondary | ICD-10-CM | POA: Insufficient documentation

## 2022-12-06 LAB — CBC WITH DIFFERENTIAL (CANCER CENTER ONLY)
Abs Immature Granulocytes: 0.02 10*3/uL (ref 0.00–0.07)
Basophils Absolute: 0.2 10*3/uL — ABNORMAL HIGH (ref 0.0–0.1)
Basophils Relative: 2 %
Eosinophils Absolute: 0.3 10*3/uL (ref 0.0–0.5)
Eosinophils Relative: 4 %
HCT: 42.1 % (ref 36.0–46.0)
Hemoglobin: 13.6 g/dL (ref 12.0–15.0)
Immature Granulocytes: 0 %
Lymphocytes Relative: 29 %
Lymphs Abs: 2.3 10*3/uL (ref 0.7–4.0)
MCH: 27 pg (ref 26.0–34.0)
MCHC: 32.3 g/dL (ref 30.0–36.0)
MCV: 83.7 fL (ref 80.0–100.0)
Monocytes Absolute: 0.7 10*3/uL (ref 0.1–1.0)
Monocytes Relative: 9 %
Neutro Abs: 4.3 10*3/uL (ref 1.7–7.7)
Neutrophils Relative %: 56 %
Platelet Count: 248 10*3/uL (ref 150–400)
RBC: 5.03 MIL/uL (ref 3.87–5.11)
RDW: 17 % — ABNORMAL HIGH (ref 11.5–15.5)
WBC Count: 7.8 10*3/uL (ref 4.0–10.5)
nRBC: 0 % (ref 0.0–0.2)

## 2022-12-06 LAB — CMP (CANCER CENTER ONLY)
ALT: 14 U/L (ref 0–44)
AST: 14 U/L — ABNORMAL LOW (ref 15–41)
Albumin: 4.3 g/dL (ref 3.5–5.0)
Alkaline Phosphatase: 78 U/L (ref 38–126)
Anion gap: 11 (ref 5–15)
BUN: 20 mg/dL (ref 8–23)
CO2: 27 mmol/L (ref 22–32)
Calcium: 9.7 mg/dL (ref 8.9–10.3)
Chloride: 102 mmol/L (ref 98–111)
Creatinine: 1.25 mg/dL — ABNORMAL HIGH (ref 0.44–1.00)
GFR, Estimated: 47 mL/min — ABNORMAL LOW (ref >60)
Glucose, Bld: 255 mg/dL — ABNORMAL HIGH (ref 70–99)
Potassium: 3.8 mmol/L (ref 3.5–5.1)
Sodium: 140 mmol/L (ref 135–145)
Total Bilirubin: 0.5 mg/dL (ref 0.3–1.2)
Total Protein: 7 g/dL (ref 6.5–8.1)

## 2022-12-06 NOTE — Progress Notes (Signed)
[facility] OFFICE PROGRESS NOTE   Diagnosis: Rosai-Dorfman disease  INTERVAL HISTORY:   Ms. Adinolfi returns as scheduled.  She continues thalidomide.  She has multiple complaints today including intermittent headaches, malaise, "swelling ", a rash over the arms, and weight gain.  She continues to have back pain.  She is undergoing physical therapy" dry needling ".  She has hot flashes.  No fever or night sweats. The left anterior thigh lesion is now more raised.  The left buttock lesion is unchanged.  She recently developed discomfort in the left abdomen.  No difficulty with bowel function.  Objective:  Vital signs in last 24 hours:  Blood pressure 124/80, pulse 68, temperature 98.1 F (36.7 C), resp. rate 18, height  (1.6 m), weight 242 lb 3.2 oz (109.9 kg), SpO2 98 %.    Lymphatics: No cervical, supraclavicular, axillary, or inguinal nodes, bilateral hidradenitis Resp: Lungs clear bilaterally Cardio: Regular rate and rhythm GI: No hepatosplenomegaly Vascular: No leg edema  Skin: Fine fading rash at the arms bilaterally?,  Hyperpigmented raised 1.5 cm lesion at the left anterior thigh-the lesion appears to be extending inferiorly, approximately 4 x 3 cm thickened and indurated lesion at the left upper buttock  Lab Results:  Lab Results  Component Value Date   WBC 7.8 12/06/2022   HGB 13.6 12/06/2022   HCT 42.1 12/06/2022   MCV 83.7 12/06/2022   PLT 248 12/06/2022   NEUTROABS 4.3 12/06/2022    CMP  Lab Results  Component Value Date   NA 140 12/06/2022   K 3.8 12/06/2022   CL 102 12/06/2022   CO2 27 12/06/2022   GLUCOSE 255 (H) 12/06/2022   BUN 20 12/06/2022   CREATININE 1.25 (H) 12/06/2022   CALCIUM 9.7 12/06/2022   PROT 7.0 12/06/2022   ALBUMIN 4.3 12/06/2022   AST 14 (L) 12/06/2022   ALT 14 12/06/2022   ALKPHOS 78 12/06/2022   BILITOT 0.5 12/06/2022   GFRNONAA 47 (L) 12/06/2022     Medications: I have reviewed the patient's current  medications.   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 PET October 2013-numerous FDG avid subdermal/subcutaneous nodules and bone lesions Prednisone October 2013 with initial response, tapered until February 2014 when skin lesions enlarged Rituximab 4 weekly doses beginning, followed by rituximab maintenance with completion of 2 years January 2016 Disease progression July 2019-no response to rituximab or Gleevec Revlimid/Decadron August 2019-partial response 01/28/2022-enlarging cutaneous lesion left anterior thigh, possible small similar lesions at the left abdomen and left lower leg PET 03/11/2022-heterogenous mildly enlarged right thyroid with calcification, cutaneous left anterior thigh lesion with moderate metabolic activity, cutaneous lesion at the mid anterior chest-nonspecific, left upper lobe pulmonary nodule with low-level FDG activity less than mediastinal blood pool-nonspecific Thalidomide beginning 09/13/2022 Thalidomide discontinued 12/06/2022 secondary to apparent toxicities (malaise, headaches,1 weight gain (and no regression of the cutaneous lesions Diabetes Hypertension Hyperlipidemia Asthma/seasonal allergies Left lung nodule on PET 03/11/2022 CT chest 07/12/2022-unchanged left upper lobe nodule, no new suspicious nodules or masses heterogenous enlargement of the thyroid 7.  Multinodular thyroid-2 right-sided nodules meet criteria for biopsy and a calcified nodule in the right mid thyroid meets criteria for surveillance imaging 8.  L5-S1 decompressive laminotomies 08/27/2022      Disposition: Ms. Pommier has been maintained on thalidomide for the past 3 months.  The left anterior thigh lesion initially appeared to be regressing, but appears progressive today.  A lesion at the left buttock is stable.  She  has multiple complaints.  She relates these symptoms to thalidomide.  We decided to discontinue thalidomide.  She will be  scheduled for a follow-up chest CT in approximately 4 weeks.  She will return for an office visit after the chest CT she is scheduled to see Dr. Leotis Pain in early July.  She will contact us with an update on her symptoms in 1-2 weeks.  She will continue follow-up with Dr. Wynetta Emery for management of back pain.  Thornton Papas, MD  12/06/2022  12:30 PM

## 2022-12-06 NOTE — Addendum Note (Signed)
Addended by: Wandalee Ferdinand on: 12/06/2022 01:05 PM   Modules accepted: Orders

## 2022-12-19 ENCOUNTER — Encounter: Payer: Self-pay | Admitting: Oncology

## 2022-12-20 ENCOUNTER — Encounter: Payer: Self-pay | Admitting: Oncology

## 2022-12-20 ENCOUNTER — Telehealth: Payer: Self-pay | Admitting: *Deleted

## 2022-12-20 NOTE — Telephone Encounter (Signed)
Called Mrs. Shari Hill after MD read her Mychart message to inquire if she has an appointment yet with Dr. Starleen Arms at [facility] yet. She reports she is scheduled for July there. She has not reached out to him yet regarding the issues she noted to Dr. Truett Perna. She plans on sending a message to him on her [facility] portal.

## 2022-12-21 ENCOUNTER — Encounter: Payer: Self-pay | Admitting: Oncology

## 2022-12-22 ENCOUNTER — Telehealth: Payer: Self-pay | Admitting: *Deleted

## 2022-12-22 NOTE — Telephone Encounter (Signed)
Noted message to patient from Dr. Bishop Limbo office discussing methotrexate in [facility]. Left VM for patient to call and update office on what are her current treatment plans and we can provide phone # to call [facility] to return the thalidomide.

## 2022-12-22 NOTE — Telephone Encounter (Addendum)
Shari Hill reports she is under the impression that Dr. Truett Perna will dose/monitor the methotrexate suggested by Dr. Leotis Pain. She does not return there till July 1st. Will make Dr. Truett Perna aware. Per Dr. Truett Perna: Can see her to discuss on 5/3 at 0830 or 5/6 at 1120. She prefers 5/6

## 2022-12-27 ENCOUNTER — Inpatient Hospital Stay: Payer: Medicare Other | Attending: Oncology | Admitting: Oncology

## 2022-12-27 VITALS — BP 129/77 | HR 78 | Temp 98.2°F | Resp 20 | Ht 63.0 in | Wt 237.7 lb

## 2022-12-27 DIAGNOSIS — E785 Hyperlipidemia, unspecified: Secondary | ICD-10-CM | POA: Diagnosis not present

## 2022-12-27 DIAGNOSIS — I10 Essential (primary) hypertension: Secondary | ICD-10-CM | POA: Diagnosis not present

## 2022-12-27 DIAGNOSIS — D763 Other histiocytosis syndromes: Secondary | ICD-10-CM | POA: Diagnosis not present

## 2022-12-27 DIAGNOSIS — R911 Solitary pulmonary nodule: Secondary | ICD-10-CM | POA: Insufficient documentation

## 2022-12-27 DIAGNOSIS — J45909 Unspecified asthma, uncomplicated: Secondary | ICD-10-CM | POA: Diagnosis not present

## 2022-12-27 DIAGNOSIS — E119 Type 2 diabetes mellitus without complications: Secondary | ICD-10-CM | POA: Diagnosis not present

## 2022-12-27 NOTE — Progress Notes (Signed)
[facility] OFFICE PROGRESS NOTE   Diagnosis: Rosai-Dorfman disease  INTERVAL HISTORY:   Shari Hill returns as scheduled.  She reports resolution of headaches and improvement in her energy level since discontinuing thalidomide.  The lesion at the left anterior thigh is more raised.  The lesion at the left buttock persists.  No new lesions. She consulted with Dr. Starleen Arms for a telehealth visit last week.  He recommends changing treatment to methotrexate (we do not have his note available today).  Objective:  Vital signs in last 24 hours:  Blood pressure 129/77, pulse 78, temperature 98.2 F (36.8 C), temperature source Oral, resp. rate 20, height 5\' 3"  (1.6 m), weight 237 lb 11.2 oz (107.8 kg), SpO2 99 %.     Resp: Lungs clear bilaterally Cardio: Regular rate and rhythm GI: No hepatosplenomegaly Skin: 5-6 cm pink raised lesion at the left buttock with several additional centimeters of subcutaneous induration, 2-3 cm raised lesion of the left anterior thigh, area of a previous lesion at the medial right lower thigh is flat   Lab Results:  Lab Results  Component Value Date   WBC 7.8 12/06/2022   HGB 13.6 12/06/2022   HCT 42.1 12/06/2022   MCV 83.7 12/06/2022   PLT 248 12/06/2022   NEUTROABS 4.3 12/06/2022    CMP  Lab Results  Component Value Date   NA 140 12/06/2022   K 3.8 12/06/2022   CL 102 12/06/2022   CO2 27 12/06/2022   GLUCOSE 255 (H) 12/06/2022   BUN 20 12/06/2022   CREATININE 1.25 (H) 12/06/2022   CALCIUM 9.7 12/06/2022   PROT 7.0 12/06/2022   ALBUMIN 4.3 12/06/2022   AST 14 (L) 12/06/2022   ALT 14 12/06/2022   ALKPHOS 78 12/06/2022   BILITOT 0.5 12/06/2022   GFRNONAA 47 (L) 12/06/2022    No results found for: "CEA1", "CEA", "ZOX096", "CA125"  No results found for: "INR", "LABPROT"  Imaging:  No results found.  Medications: I have reviewed the patient's current medications.   Assessment/Plan: Rosai-Dorfman disease with  cutaneous and bone involvement diagnosed in 2009 PET October 2013-numerous FDG avid subdermal/subcutaneous nodules and bone lesions Prednisone October 2013 with initial response, tapered until February 2014 when skin lesions enlarged Rituximab 4 weekly doses beginning, followed by rituximab maintenance with completion of 2 years January 2016 Disease progression July 2019-no response to rituximab or Gleevec Revlimid/Decadron August 2019-partial response 01/28/2022-enlarging cutaneous lesion left anterior thigh, possible small similar lesions at the left abdomen and left lower leg PET 03/11/2022-heterogenous mildly enlarged right thyroid with calcification, cutaneous left anterior thigh lesion with moderate metabolic activity, cutaneous lesion at the mid anterior chest-nonspecific, left upper lobe pulmonary nodule with low-level FDG activity less than mediastinal blood pool-nonspecific Thalidomide beginning 09/13/2022 Thalidomide discontinued 12/06/2022 secondary to apparent toxicities (malaise, headaches,1 weight gain (and no regression of the cutaneous lesions Diabetes Hypertension Hyperlipidemia Asthma/seasonal allergies Left lung nodule on PET 03/11/2022 CT chest 07/12/2022-unchanged left upper lobe nodule, no new suspicious nodules or masses heterogenous enlargement of the thyroid 7.  Multinodular thyroid-2 right-sided nodules meet criteria for biopsy and a calcified nodule in the right mid thyroid meets criteria for surveillance imaging 8.  L5-S1 decompressive laminotomies 08/27/2022       Disposition: Shari Hill has Rosai-Dorfman syndrome.  She has multiple cutaneous lesions.  Dr. Starleen Arms recommends changing treatment to weekly methotrexate.  I reviewed potential toxicities associated with methotrexate including the chance of nausea, mucositis, rash, alopecia, hematologic toxicity, and pulmonary toxicity.  She would  like to undergo a restaging PET scan prior to treatment.  She would like to  consider a trial of cutaneous radiation if disease is limited to a few skin lesions.  Ms. Chuba will be scheduled for the PET scan next week.  She will return for an office visit as scheduled on 01/14/2023.  Thornton Papas, MD  12/27/2022  12:12 PM

## 2022-12-31 ENCOUNTER — Other Ambulatory Visit: Payer: Self-pay | Admitting: *Deleted

## 2022-12-31 ENCOUNTER — Encounter: Payer: Self-pay | Admitting: *Deleted

## 2022-12-31 ENCOUNTER — Other Ambulatory Visit: Payer: Self-pay | Admitting: Oncology

## 2022-12-31 DIAGNOSIS — C7B00 Secondary carcinoid tumors, unspecified site: Secondary | ICD-10-CM

## 2022-12-31 DIAGNOSIS — D763 Other histiocytosis syndromes: Secondary | ICD-10-CM | POA: Insufficient documentation

## 2023-01-10 ENCOUNTER — Ambulatory Visit (HOSPITAL_BASED_OUTPATIENT_CLINIC_OR_DEPARTMENT_OTHER): Payer: Medicare Other

## 2023-01-10 ENCOUNTER — Encounter: Payer: Self-pay | Admitting: Oncology

## 2023-01-14 ENCOUNTER — Inpatient Hospital Stay: Payer: Medicare Other | Admitting: Oncology

## 2023-01-21 ENCOUNTER — Encounter: Payer: Self-pay | Admitting: Oncology

## 2023-01-21 ENCOUNTER — Ambulatory Visit (HOSPITAL_COMMUNITY)
Admission: RE | Admit: 2023-01-21 | Discharge: 2023-01-21 | Disposition: A | Discharge status: Home / Self Care | Payer: Medicare Other | Source: Ambulatory Visit | Attending: Oncology | Admitting: Oncology

## 2023-01-21 DIAGNOSIS — C7B00 Secondary carcinoid tumors, unspecified site: Secondary | ICD-10-CM | POA: Insufficient documentation

## 2023-01-21 DIAGNOSIS — C7951 Secondary malignant neoplasm of bone: Secondary | ICD-10-CM | POA: Insufficient documentation

## 2023-01-21 DIAGNOSIS — R9389 Abnormal findings on diagnostic imaging of other specified body structures: Secondary | ICD-10-CM | POA: Diagnosis not present

## 2023-01-21 DIAGNOSIS — R918 Other nonspecific abnormal finding of lung field: Secondary | ICD-10-CM | POA: Diagnosis not present

## 2023-01-21 DIAGNOSIS — D763 Other histiocytosis syndromes: Secondary | ICD-10-CM | POA: Diagnosis not present

## 2023-01-21 LAB — GLUCOSE, CAPILLARY: Glucose-Capillary: 180 mg/dL — ABNORMAL HIGH (ref 70–99)

## 2023-01-21 MED ORDER — FLUDEOXYGLUCOSE F - 18 (FDG) INJECTION
11.8000 | Freq: Once | INTRAVENOUS | Status: AC
  Administered 2023-01-21: 11.8 via INTRAVENOUS

## 2023-01-25 ENCOUNTER — Telehealth: Payer: Self-pay

## 2023-01-25 ENCOUNTER — Telehealth: Payer: Self-pay | Admitting: *Deleted

## 2023-01-25 ENCOUNTER — Telehealth: Payer: Self-pay | Admitting: Oncology

## 2023-01-25 NOTE — Telephone Encounter (Signed)
-----   Message from Ladene Artist, MD sent at 01/24/2023  7:16 PM EDT ----- Needs f/u visit next 2-3 weeks to review PET

## 2023-01-25 NOTE — Telephone Encounter (Signed)
Patient is schedule 

## 2023-01-25 NOTE — Telephone Encounter (Signed)
Informed patient that PET showed a couple other lesions we were not aware of. Dr. Truett Perna thinks systemic treatment is better than RT. She agrees to come in on 6/13 to review scan w/MD and plan treatment. Dr. Truett Perna instructed to put on NP schedule and he will come in to review scan

## 2023-01-31 ENCOUNTER — Encounter: Payer: Self-pay | Admitting: Oncology

## 2023-02-03 ENCOUNTER — Inpatient Hospital Stay: Payer: Medicare Other | Attending: Oncology | Admitting: Nurse Practitioner

## 2023-02-03 ENCOUNTER — Encounter: Payer: Self-pay | Admitting: Nurse Practitioner

## 2023-02-03 ENCOUNTER — Inpatient Hospital Stay: Payer: Medicare Other

## 2023-02-03 VITALS — BP 140/75 | HR 80 | Temp 98.2°F | Resp 18 | Ht 63.0 in | Wt 234.8 lb

## 2023-02-03 DIAGNOSIS — D763 Other histiocytosis syndromes: Secondary | ICD-10-CM | POA: Insufficient documentation

## 2023-02-03 DIAGNOSIS — I10 Essential (primary) hypertension: Secondary | ICD-10-CM | POA: Diagnosis not present

## 2023-02-03 DIAGNOSIS — E785 Hyperlipidemia, unspecified: Secondary | ICD-10-CM | POA: Diagnosis not present

## 2023-02-03 DIAGNOSIS — J45909 Unspecified asthma, uncomplicated: Secondary | ICD-10-CM | POA: Insufficient documentation

## 2023-02-03 DIAGNOSIS — R911 Solitary pulmonary nodule: Secondary | ICD-10-CM | POA: Diagnosis not present

## 2023-02-03 DIAGNOSIS — E119 Type 2 diabetes mellitus without complications: Secondary | ICD-10-CM | POA: Diagnosis not present

## 2023-02-03 LAB — CMP (CANCER CENTER ONLY)
ALT: 10 U/L (ref 0–44)
AST: 15 U/L (ref 15–41)
Albumin: 4.6 g/dL (ref 3.5–5.0)
Alkaline Phosphatase: 88 U/L (ref 38–126)
Anion gap: 10 (ref 5–15)
BUN: 21 mg/dL (ref 8–23)
CO2: 29 mmol/L (ref 22–32)
Calcium: 10.4 mg/dL — ABNORMAL HIGH (ref 8.9–10.3)
Chloride: 105 mmol/L (ref 98–111)
Creatinine: 1.18 mg/dL — ABNORMAL HIGH (ref 0.44–1.00)
GFR, Estimated: 50 mL/min — ABNORMAL LOW (ref >60)
Glucose, Bld: 134 mg/dL — ABNORMAL HIGH (ref 70–99)
Potassium: 3.9 mmol/L (ref 3.5–5.1)
Sodium: 144 mmol/L (ref 135–145)
Total Bilirubin: 0.3 mg/dL (ref 0.3–1.2)
Total Protein: 7.7 g/dL (ref 6.5–8.1)

## 2023-02-03 LAB — CBC WITH DIFFERENTIAL (CANCER CENTER ONLY)
Abs Immature Granulocytes: 0.04 10*3/uL (ref 0.00–0.07)
Basophils Absolute: 0 10*3/uL (ref 0.0–0.1)
Basophils Relative: 0 %
Eosinophils Absolute: 0.1 10*3/uL (ref 0.0–0.5)
Eosinophils Relative: 1 %
HCT: 42.3 % (ref 36.0–46.0)
Hemoglobin: 13.9 g/dL (ref 12.0–15.0)
Immature Granulocytes: 0 %
Lymphocytes Relative: 26 %
Lymphs Abs: 2.6 10*3/uL (ref 0.7–4.0)
MCH: 26.9 pg (ref 26.0–34.0)
MCHC: 32.9 g/dL (ref 30.0–36.0)
MCV: 82 fL (ref 80.0–100.0)
Monocytes Absolute: 0.7 10*3/uL (ref 0.1–1.0)
Monocytes Relative: 7 %
Neutro Abs: 6.6 10*3/uL (ref 1.7–7.7)
Neutrophils Relative %: 66 %
Platelet Count: 215 10*3/uL (ref 150–400)
RBC: 5.16 MIL/uL — ABNORMAL HIGH (ref 3.87–5.11)
RDW: 18.1 % — ABNORMAL HIGH (ref 11.5–15.5)
WBC Count: 10.1 10*3/uL (ref 4.0–10.5)
nRBC: 0 % (ref 0.0–0.2)

## 2023-02-03 MED ORDER — FOLIC ACID 1 MG PO TABS
1.0000 mg | ORAL_TABLET | Freq: Every day | ORAL | 3 refills | Status: DC
Start: 2023-02-03 — End: 2023-04-27

## 2023-02-03 MED ORDER — FOLIC ACID 1 MG PO TABS
1.0000 mg | ORAL_TABLET | Freq: Every day | ORAL | 3 refills | Status: DC
Start: 2023-02-03 — End: 2023-02-03

## 2023-02-03 NOTE — Progress Notes (Signed)
[facility] OFFICE PROGRESS NOTE   Diagnosis: Rosai-Dorfman disease  INTERVAL HISTORY:   Shari Hill returns as scheduled.  She thinks the left buttock and left upper thigh regions are increasing in size.  There is associated pain/tenderness.  No drainage.  No fevers or sweats.  She has a good appetite.  Objective:  Vital signs in last 24 hours:  Blood pressure (!) 140/75, pulse 80, temperature 98.2 F (36.8 C), temperature source Oral, resp. rate 18, height 5\' 3"  (1.6 m), weight 234 lb 12.8 oz (106.5 kg), SpO2 98 %.     Resp: Lungs clear bilaterally. Cardio: Regular rate and rhythm. GI: No hepatosplenomegaly. Vascular: No leg edema. Skin: Large raised lesion at the left buttock with surrounding induration.  3 cm raised lesion left anterior thigh.   Lab Results:  Lab Results  Component Value Date   WBC 7.8 12/06/2022   HGB 13.6 12/06/2022   HCT 42.1 12/06/2022   MCV 83.7 12/06/2022   PLT 248 12/06/2022   NEUTROABS 4.3 12/06/2022    Imaging:  No results found.  Medications: I have reviewed the patient's current medications.  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 PET October 2013-numerous FDG avid subdermal/subcutaneous nodules and bone lesions Prednisone October 2013 with initial response, tapered until February 2014 when skin lesions enlarged Rituximab 4 weekly doses beginning, followed by rituximab maintenance with completion of 2 years January 2016 Disease progression July 2019-no response to rituximab or Gleevec Revlimid/Decadron August 2019-partial response 01/28/2022-enlarging cutaneous lesion left anterior thigh, possible small similar lesions at the left abdomen and left lower leg PET 03/11/2022-heterogenous mildly enlarged right thyroid with calcification, cutaneous left anterior thigh lesion with moderate metabolic activity, cutaneous lesion at the mid anterior chest-nonspecific, left upper  lobe pulmonary nodule with low-level FDG activity less than mediastinal blood pool-nonspecific Thalidomide beginning 09/13/2022 Thalidomide discontinued 12/06/2022 secondary to apparent toxicities (malaise, headaches,1 weight gain (and no regression of the cutaneous lesions) PET scan 01/21/2023-intense hypermetabolic subcutaneous nodular thickening in the posterior left buttock region; similar metabolic cutaneous lesion anterior left thigh; intense metabolic activity lateral to the right lateral femoral condyle; intense radiotracer activity in the dorsum of the left foot superficial to the proximal third phalanx 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  Stable left upper lobe pulmonary nodule with very low metabolic activity. Methotrexate beginning 02/07/2023 Diabetes Hypertension Hyperlipidemia Asthma/seasonal allergies Left lung nodule on PET 03/11/2022 CT chest 07/12/2022-unchanged left upper lobe nodule, no new suspicious nodules or masses heterogenous enlargement of the thyroid 7.  Multinodular thyroid-2 right-sided nodules meet criteria for biopsy and a calcified nodule in the right mid thyroid meets criteria for surveillance imaging 8.  L5-S1 decompressive laminotomies 08/27/2022  Disposition: Shari Hill appears stable.  We reviewed the PET scan results/images with her and her husband at today's visit.  They understand the recommendation to begin systemic therapy with methotrexate.  We again reviewed potential toxicities.  She agrees to proceed.  Prescription for folic acid 1 mg daily sent to her pharmacy.  She understands she should not take folic acid on the day she takes methotrexate.  We are obtaining baseline labs today.  She will return for lab and follow-up in about 1 month.  Patient seen with Dr. Truett Perna.  Lonna Cobb ANP/GNP-BC   02/03/2023  1:19 PM  This was a shared visit with Lonna Cobb.  Shari Hill was interviewed and examined.  We reviewed the PET findings and images with her.   She appears to have  systemic progression of the Rosai-Dorfman disease in multiple sites.  She reports Dr. Leotis Pain recommends changing treatment to weekly methotrexate.  We will contact Dr. Leotis Pain to confirm this recommendation versus a trial of lenalidomide.  We reviewed potential toxicities associated with methotrexate.  She agrees to proceed.  She will return for an office visit after 3-4 weeks of methotrexate.  We will consider palliative radiation to the dominant left buttock mass if methotrexate is not helpful.  I was present for greater than 50% of today's visit.  I performed medical decision making.  Mancel Bale, MD

## 2023-02-04 ENCOUNTER — Other Ambulatory Visit: Payer: Self-pay | Admitting: Neurosurgery

## 2023-02-04 ENCOUNTER — Telehealth: Payer: Self-pay | Admitting: Nurse Practitioner

## 2023-02-04 ENCOUNTER — Other Ambulatory Visit: Payer: Self-pay | Admitting: Oncology

## 2023-02-04 MED ORDER — METHOTREXATE SODIUM 10 MG PO TABS: 10.0000 mg | ORAL_TABLET | ORAL | 0 refills | Status: DC

## 2023-02-04 NOTE — Telephone Encounter (Signed)
I contacted Ms. Glendinning to let her know methotrexate and folic acid were sent to her local pharmacy.  Plan for her to begin methotrexate on 02/07/2023.  She will take folic acid daily except on the day she takes methotrexate.  She will discontinue daily aspirin due to a potential interaction with methotrexate.

## 2023-02-10 ENCOUNTER — Ambulatory Visit: Payer: Medicare Other | Admitting: Oncology

## 2023-02-10 NOTE — Progress Notes (Signed)
Surgical Instructions    Your procedure is scheduled on February 16, 2023.  Report to [facility] Main Entrance "A" at 9:30 A.M., then check in with the Admitting office.  Call this number if you have problems the morning of surgery:  [phone]   If you have any questions prior to your surgery date call [phone]: Open Monday-Friday 8am-4pm If you experience any cold or flu symptoms such as cough, fever, chills, shortness of breath, etc. between now and your scheduled surgery, please notify us at the above number     Remember:  Do not eat after midnight the night before your surgery  You may drink clear liquids until 8:30AM the morning of your surgery.   Clear liquids allowed are: Water, Non-Citrus Juices (without pulp), Carbonated Beverages, Clear Tea, Black Coffee ONLY (NO MILK, CREAM OR POWDERED CREAMER of any kind), and Gatorade    Take these medicines the morning of surgery with A SIP OF WATER:   If needed: acetaminophen (TYLENOL)  albuterol (VENTOLIN HFA) 108 (90 Base) MCG/ACT inhaler - Please bring all inhalers with you the day of surgery.  azelastine (OPTIVAR) 0.05 % ophthalmic solution  budesonide-formoterol (SYMBICORT)   As of today, STOP taking any Aspirin (unless otherwise instructed by your surgeon) Aleve, Naproxen, Ibuprofen, Motrin, Advil, Goody's, BC's, all herbal medications, fish oil, and all vitamins.  Follow your surgeon's instructions on whether to stop taking methotrexate (RHEUMATREX) prior to/after surgery.  If no instructions were given by your surgeon then you will need to call the office to get those instructions.            WHAT DO I DO ABOUT MY DIABETES MEDICATION?   Do not take oral diabetes medicines (pills) the morning of surgery.   DO NOT TAKE glipiZIDE (GLUCOTROL XL) the night before or morning of surgery  DO NOT TAKE Empagliflozin (JARDIANCE) for 72 hours prior to surgery. DO NOT take after 02/12/23  DO NOT TAKE SitaGLIPtin-MetFORMIN HCl  (JANUMET XR) the morning of surgery.    The day of surgery, do not take other diabetes injectables, including Byetta (exenatide), Bydureon (exenatide ER), Victoza (liraglutide), or Trulicity (dulaglutide).  If your CBG is greater than 220 mg/dL, you may take  of your sliding scale (correction) dose of insulin.   HOW TO MANAGE YOUR DIABETES BEFORE AND AFTER SURGERY  Why is it important to control my blood sugar before and after surgery? Improving blood sugar levels before and after surgery helps healing and can limit problems. A way of improving blood sugar control is eating a healthy diet by:  Eating less sugar and carbohydrates  Increasing activity/exercise  Talking with your doctor about reaching your blood sugar goals High blood sugars (greater than 180 mg/dL) can raise your risk of infections and slow your recovery, so you will need to focus on controlling your diabetes during the weeks before surgery. Make sure that the doctor who takes care of your diabetes knows about your planned surgery including the date and location.  How do I manage my blood sugar before surgery? Check your blood sugar at least 4 times a day, starting 2 days before surgery, to make sure that the level is not too high or low.  Check your blood sugar the morning of your surgery when you wake up and every 2 hours until you get to the Short Stay unit.  If your blood sugar is less than 70 mg/dL, you will need to treat for low blood sugar: Do not take insulin.  Treat a low blood sugar (less than 70 mg/dL) with  cup of clear juice (cranberry or apple), 4 glucose tablets, OR glucose gel. Recheck blood sugar in 15 minutes after treatment (to make sure it is greater than 70 mg/dL). If your blood sugar is not greater than 70 mg/dL on recheck, call [phone] for further instructions. Report your blood sugar to the short stay nurse when you get to Short Stay.  If you are admitted to the hospital after surgery: Your  blood sugar will be checked by the staff and you will probably be given insulin after surgery (instead of oral diabetes medicines) to make sure you have good blood sugar levels. The goal for blood sugar control after surgery is 80-180 mg/dL.   [facility] is not responsible for any belongings or valuables.    Do NOT Smoke (Tobacco/Vaping)  24 hours prior to your procedure  If you use a CPAP at night, you may bring your mask for your overnight stay.   Contacts, glasses, hearing aids, dentures or partials may not be worn into surgery, please bring cases for these belongings   For patients admitted to the hospital, discharge time will be determined by your treatment team.   Patients discharged the day of surgery will not be allowed to drive home, and someone needs to stay with them for 24 hours.   SURGICAL WAITING ROOM VISITATION Patients having surgery or a procedure may have no more than 2 support people in the waiting area - these visitors may rotate.   Children under the age of 76 must have an adult with them who is not the patient. If the patient needs to stay at the hospital during part of their recovery, the visitor guidelines for inpatient rooms apply. Pre-op nurse will coordinate an appropriate time for 1 support person to accompany patient in pre-op.  This support person may not rotate.   Please refer to [email or web address] for the visitor guidelines for Inpatients (after your surgery is over and you are in a regular room).    Special instructions:    Oral Hygiene is also important to reduce your risk of infection.  Remember - BRUSH YOUR TEETH THE MORNING OF SURGERY WITH YOUR REGULAR TOOTHPASTE     Pre-operative 5 CHG Bath Instructions   You can play a key role in reducing the risk of infection after surgery. Your skin needs to be as free of germs as possible. You can reduce the number of germs on your skin by washing with  CHG (chlorhexidine gluconate) soap before surgery. CHG is an antiseptic soap that kills germs and continues to kill germs even after washing.   DO NOT use if you have an allergy to chlorhexidine/CHG or antibacterial soaps. If your skin becomes reddened or irritated, stop using the CHG and notify one of our RNs at [phone].   Please shower with the CHG soap starting 4 days before surgery using the following schedule:     Please keep in mind the following:  DO NOT shave, including legs and underarms, starting the day of your first shower.   You may shave your face at any point before/day of surgery.  Place clean sheets on your bed the day you start using CHG soap. Use a clean washcloth (not used since being washed) for each shower. DO NOT sleep with pets once you start using the CHG.   CHG Shower Instructions:  If you choose to wash your hair and private area, wash first with your  normal shampoo/soap.  After you use shampoo/soap, rinse your hair and body thoroughly to remove shampoo/soap residue.  Turn the water OFF and apply about 3 tablespoons (45 ml) of CHG soap to a CLEAN washcloth.  Apply CHG soap ONLY FROM YOUR NECK DOWN TO YOUR TOES (washing for 3-5 minutes)  DO NOT use CHG soap on face, private areas, open wounds, or sores.  Pay special attention to the area where your surgery is being performed.  If you are having back surgery, having someone wash your back for you may be helpful. Wait 2 minutes after CHG soap is applied, then you may rinse off the CHG soap.  Pat dry with a clean towel  Put on clean clothes/pajamas   If you choose to wear lotion, please use ONLY the CHG-compatible lotions on the back of this paper.     Additional instructions for the day of surgery: DO NOT APPLY any lotions, deodorants, cologne, or perfumes.   Put on clean/comfortable clothes.  Brush your teeth.  Ask your nurse before applying any prescription medications to the skin. Do not wear jewelry  or makeup. Do not wear lotions, powders, perfumes/cologne or deodorant. Do not shave 48 hours prior to surgery.  Men may shave face and neck. Do not bring valuables to the hospital. Do not wear nail polish, gel polish, artificial nails, or any other type of covering on natural nails (fingers and toes) If you have artificial nails or gel coating that need to be removed by a nail salon, please have this removed prior to surgery. Artificial nails or gel coating may interfere with anesthesia's ability to adequately monitor your vital signs.     CHG Compatible Lotions   Aveeno Moisturizing lotion  Cetaphil Moisturizing Cream  Cetaphil Moisturizing Lotion  Clairol Herbal Essence Moisturizing Lotion, Dry Skin  Clairol Herbal Essence Moisturizing Lotion, Extra Dry Skin  Clairol Herbal Essence Moisturizing Lotion, Normal Skin  Curel Age Defying Therapeutic Moisturizing Lotion with Alpha Hydroxy  Curel Extreme Care Body Lotion  Curel Soothing Hands Moisturizing Hand Lotion  Curel Therapeutic Moisturizing Cream, Fragrance-Free  Curel Therapeutic Moisturizing Lotion, Fragrance-Free  Curel Therapeutic Moisturizing Lotion, Original Formula  Eucerin Daily Replenishing Lotion  Eucerin Dry Skin Therapy Plus Alpha Hydroxy Crme  Eucerin Dry Skin Therapy Plus Alpha Hydroxy Lotion  Eucerin Original Crme  Eucerin Original Lotion  Eucerin Plus Crme Eucerin Plus Lotion  Eucerin TriLipid Replenishing Lotion  Keri Anti-Bacterial Hand Lotion  Keri Deep Conditioning Original Lotion Dry Skin Formula Softly Scented  Keri Deep Conditioning Original Lotion, Fragrance Free Sensitive Skin Formula  Keri Lotion Fast Absorbing Fragrance Free Sensitive Skin Formula  Keri Lotion Fast Absorbing Softly Scented Dry Skin Formula  Keri Original Lotion  Keri Skin Renewal Lotion Keri Silky Smooth Lotion  Keri Silky Smooth Sensitive Skin Lotion  Nivea Body Creamy Conditioning Oil  Nivea Body Extra Enriched Lotion   Nivea Body Original Lotion  Nivea Body Sheer Moisturizing Lotion Nivea Crme  Nivea Skin Firming Lotion  NutraDerm 30 Skin Lotion  NutraDerm Skin Lotion  NutraDerm Therapeutic Skin Cream  NutraDerm Therapeutic Skin Lotion  ProShield Protective Hand Cream  Provon moisturizing lotion     If you received a COVID test during your pre-op visit, it is requested that you wear a mask when out in public, stay away from anyone that may not be feeling well, and notify your surgeon if you develop symptoms. If you have been in contact with anyone that has tested positive in the last 10  days, please notify your surgeon.    Please read over the following fact sheets that you were given.

## 2023-02-11 ENCOUNTER — Encounter: Payer: Self-pay | Admitting: Oncology

## 2023-02-11 ENCOUNTER — Other Ambulatory Visit: Payer: Self-pay | Admitting: Neurosurgery

## 2023-02-11 ENCOUNTER — Encounter (HOSPITAL_COMMUNITY)
Admission: RE | Admit: 2023-02-11 | Discharge: 2023-02-11 | Disposition: A | Discharge status: Home / Self Care | Payer: Medicare Other | Source: Ambulatory Visit | Attending: Neurosurgery | Admitting: Neurosurgery

## 2023-02-11 ENCOUNTER — Other Ambulatory Visit: Payer: Self-pay

## 2023-02-11 ENCOUNTER — Encounter (HOSPITAL_COMMUNITY): Payer: Self-pay

## 2023-02-11 VITALS — BP 139/75 | HR 75 | Temp 98.6°F | Resp 18 | Ht 63.0 in | Wt 234.6 lb

## 2023-02-11 DIAGNOSIS — R911 Solitary pulmonary nodule: Secondary | ICD-10-CM | POA: Diagnosis not present

## 2023-02-11 DIAGNOSIS — E042 Nontoxic multinodular goiter: Secondary | ICD-10-CM | POA: Insufficient documentation

## 2023-02-11 DIAGNOSIS — Z96652 Presence of left artificial knee joint: Secondary | ICD-10-CM | POA: Insufficient documentation

## 2023-02-11 DIAGNOSIS — Z01818 Encounter for other preprocedural examination: Secondary | ICD-10-CM | POA: Diagnosis present

## 2023-02-11 DIAGNOSIS — R9431 Abnormal electrocardiogram [ECG] [EKG]: Secondary | ICD-10-CM | POA: Diagnosis not present

## 2023-02-11 DIAGNOSIS — E119 Type 2 diabetes mellitus without complications: Secondary | ICD-10-CM | POA: Insufficient documentation

## 2023-02-11 DIAGNOSIS — M4316 Spondylolisthesis, lumbar region: Secondary | ICD-10-CM | POA: Insufficient documentation

## 2023-02-11 DIAGNOSIS — I10 Essential (primary) hypertension: Secondary | ICD-10-CM | POA: Diagnosis not present

## 2023-02-11 DIAGNOSIS — G4733 Obstructive sleep apnea (adult) (pediatric): Secondary | ICD-10-CM | POA: Insufficient documentation

## 2023-02-11 DIAGNOSIS — E785 Hyperlipidemia, unspecified: Secondary | ICD-10-CM | POA: Insufficient documentation

## 2023-02-11 LAB — GLUCOSE, CAPILLARY: Glucose-Capillary: 150 mg/dL — ABNORMAL HIGH (ref 70–99)

## 2023-02-11 LAB — SURGICAL PCR SCREEN
MRSA, PCR: NEGATIVE
Staphylococcus aureus: NEGATIVE

## 2023-02-11 LAB — TYPE AND SCREEN
ABO/RH(D): O POS
Antibody Screen: NEGATIVE

## 2023-02-11 LAB — HEMOGLOBIN A1C
Hgb A1c MFr Bld: 8.8 % — ABNORMAL HIGH (ref 4.8–5.6)
Mean Plasma Glucose: 205.86 mg/dL

## 2023-02-11 NOTE — Progress Notes (Signed)
Dr. Lonie Peak office was contacted regarding preop orders.

## 2023-02-11 NOTE — Progress Notes (Addendum)
PCP - Merian Capron NP Cardiologist - denies  PPM/ICD - denies Device Orders - n/a Rep Notified - n/a  Chest x-ray - denies EKG - 08/24/2022 Stress Test - denies ECHO - denies Cardiac Cath - denies  Sleep Study - yes, 2016 CPAP - yes  Fasting Blood Sugar - 150; pt states it's usually 130 Checks Blood Sugar three times a week  Last dose of GLP1 agonist-  no  GLP1 instructions: n/a  Blood Thinner Instructions: n/a Aspirin Instructions: n/a  ERAS Protcol - yes till 0830 am PRE-SURGERY Ensure or G2- no  COVID TEST- n/a   Anesthesia review: yes (anesthesia complications in the past)    Patient denies shortness of breath, fever, cough and chest pain at PAT appointment and the last 2 months.   All instructions explained to the patient, with a verbal understanding of the material. Patient agrees to go over the instructions while at home for a better understanding. Patient also instructed to self quarantine after being tested for COVID-19. The opportunity to ask questions was provided.

## 2023-02-15 NOTE — Progress Notes (Signed)
Anesthesia Chart Review:  Case: [id number] Date/Time: 02/16/23 1115   Procedure: PLIF - L3-L4 - L4-L5 extension and removal L5-S1 - Posterior Lateral and Interbody fusion (Back)   Anesthesia type: General   Pre-op diagnosis: Spondylolisthesis   Location: [facility] OR ROOM 19 / [facility] OR   Surgeons: Donalee Citrin, MD       DISCUSSION: Patient is a 67 year old scheduled for the above procedure.   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 02/28/20 CT [facility] Everywhere) indicates history of hysterectomy with grossly normal ovaries.   For anesthesia history, there is a reported a history of DIFFICULT INTUBATION. Patient brought in a letter dated 05/19/11 (from the [employer] of [employer] [facility]) indicating "It is more difficult to place the breathing tube for you then and most other people" and encouraged patient to communicate this to future anesthesia providers and to wear a "Difficult Tracheal Intubation" Medial Alert bracelet. Per Pre-Anesthesia Consultation on 08/21/20 (see  [facility] Everywhere):  "Prior Anesthesia: Patient reports prior difficulty with intubation  NOTE form anesthesiologist: (scanned to chart.) Pt with hx of difficulty with intubation. Unsuccessfully used Mac 3, Glidescope 4, Glydescope 3. Successfully used Fiberoptic scope."  Regional anesthesia was planned for left TKA with LMA/Difficult airway care immediately available for case including FOB in case or need for intubation. Patient did not require intubation for her 09/05/20 left uni-knee. - She was successfully intubated with a 7.0 mm ETT Parker flex tip on 08/27/22 using 3 and Glidescope, VL x1, grade 1 view. Mast ventilation without difficulty. GDifficult intubation was anticipated due to history, reduced neck mobility, limited oral opening, anterior larynx.     Patient was last evaluated by [facility] on 02/03/23 by Lonna Cobb, NP for Rosai-Dorfman disease follow-up. Patient has had a known left anterior thigh lesion, but in July 2023 imaging showed new lesions in the left buttock and possible mid chest. LUL lung lesion had low level FDG uptake. She also had an enlarged right thyroid lobe, s/p biopsy on 08/03/22 with insufficient specimen. She wished to resume treatment for RDD at that time. [facility] Dr. Leotis Pain had recommended thalidomide or methotrexate. She was started on thalidonmide around late January 2024 but did not tolerate. 01/21/23 PET scan showed intense hypermetabolic subcutaenous nodular thickening in the posterior left buttocks consistent with cutaneous malignancy recurrence, similar metabolic cutaneous lesion in the anterior left thigh, intense metabolic activity lateral to there right lateral femoral condyle with soft tissue metastasis favored over skeletal metastasis, intense radiotracer activity in the dorsum of the left foot also concerning for soft tissue metastasis, and stable LUL pulmonary nodule with very low metabolic activity. Methotrexate was recommended  at her June visit. She has reached out to Dr. Nolon Rod office to inquire if she should hold her  methotrexate weekly dose prior to surgery.   She was instructed to hold Jardiance for 72 hours prior to surgery. 08/24/22. A1c is 8.8% on 02/11/23, up from 7.4% five months ago. Home fasting CBGs reported as ~130 - 150. She is on Jardiance10 mg daily, Janumet XR [id number] mg daily, and Glucotrol XL 5 mg daily for DM. A1c results routed to Dr. Wynetta Emery. She will get a CBG on arrival.   Anesthesia team to evaluate on the day of surgery.    VS: BP 139/75   Pulse 75   Temp 37 C (Oral)   Resp 18   Ht 5\' 3"  (1.6 m)   Wt 106.4 kg   SpO2 100%   BMI 41.56 kg/m    PROVIDERS: Moshe Cipro, NP is PCP Thornton Papas, MD is [facility]). Established 10/14/21 after relocating to [place] from [place] in 05/2021.  Leotis Pain, Lockie Pares, MD is [facility]) Serena Colonel, MD is ENT ([facility])    LABS: Preoperative labs noted. See DISCUSSION. (all labs ordered are listed, but only abnormal results are displayed)  Labs Reviewed  GLUCOSE, CAPILLARY - Abnormal; Notable for the following components:      Result Value   Glucose-Capillary 150 (*)    All other components within normal limits  HEMOGLOBIN A1C - Abnormal; Notable for the following components:   Hgb A1c MFr Bld 8.8 (*)    All other components within normal limits  SURGICAL PCR SCREEN  TYPE AND SCREEN    IMAGES: PET Scan 01/21/23: IMPRESSION: 1. Intense hypermetabolic subcutaneous nodular thickening in the posterior LEFT buttocks region consistent with cutaneous malignancy recurrence. 2. Similar metabolic cutaneous lesion in the anterior LEFT thigh. 3. Intense metabolic activity lateral to the RIGHT lateral femoral condyle. Favor soft tissue metastasis over skeletal metastasis. 4. Intense radiotracer activity in the dorsum of the LEFT foot superficial to proximal third phalanx is also 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 5.  Stable LEFT upper lobe pulmonary nodule with very low metabolic activity.  CT Chest 07/12/22: IMPRESSION: 1. Unchanged size of the solid 9 x 8 mm left upper lobe pulmonary nodule which was minimally metabolic on prior PET-CT. Suggest follow-up chest CT in 6-12 months to assess stability. 2. No new suspicious pulmonary nodules or masses. 3. Stable 12 mm cutaneous nodule in the midline chest. 4. Hepatic steatosis. 5. Heterogeneous nodular enlargement of the thyroid gland previously evaluated by ultrasound on May 14, 2022. 6.  Aortic Atherosclerosis (ICD10-I70.0).   US Thyroid 05/14/22: IMPRESSION: 1. Heterogeneous, enlarged and multinodular thyroid gland most consistent with multinodular goiter. 2. Nodules #5 and #6 in the right inferior gland meet criteria to consider fine-needle aspiration biopsy if not previously sampled. Recommend correlation with prior imaging and biopsy results. 3. Peripherally calcified nodule #3 in the right mid gland meets criteria for imaging surveillance. Recommend follow-up ultrasound in 1 year. - The above is in keeping with the ACR TI-RADS recommendations - J Am Coll Radiol 2017;14:587-595. - 08/03/22 right inferior nodule biopsy was insufficient sample for diagnosis.     EKG: 08/24/22: Normal sinus rhythm Low voltage QRS Cannot rule out Anterior infarct , age undetermined Abnormal ECG No previous ECGs available     CV: N/A  Past Medical History:  Diagnosis Date   Asthma    Diabetes mellitus without complication (HCC)    Difficult intubation 05/19/2011   "Unsuccessfully used Mac 3, Glidescope 4, Glydescope 3. Successfully used Fiberoptic scope"   Hyperlipidemia    Hypertension    Multinodular thyroid  Nodule of left lung    PONV (postoperative nausea and vomiting)    Rosai-Dorfman disease (HCC)    Sleep apnea     Past Surgical History:  Procedure Laterality Date   APPENDECTOMY     CHOLECYSTECTOMY     EXPLORATORY LAPAROTOMY      MEDIAL PARTIAL KNEE REPLACEMENT  09/05/2020   NASAL SINUS SURGERY  2005   ROTATOR CUFF REPAIR Left     MEDICATIONS:  acetaminophen (TYLENOL) 500 MG tablet   acetaminophen-codeine (TYLENOL #2) 300-15 MG tablet   albuterol (VENTOLIN HFA) 108 (90 Base) MCG/ACT inhaler   amLODipine (NORVASC) 10 MG tablet   atorvastatin (LIPITOR) 20 MG tablet   azelastine (OPTIVAR) 0.05 % ophthalmic solution   Azelastine HCl 137 MCG/SPRAY SOLN   budesonide-formoterol (SYMBICORT) 160-4.5 MCG/ACT inhaler   empagliflozin (JARDIANCE) 10 MG TABS tablet   EPINEPHrine 0.3 mg/0.3 mL IJ SOAJ injection   folic acid (FOLVITE) 1 MG tablet   glipiZIDE (GLUCOTROL XL) 5 MG 24 hr tablet   hydrochlorothiazide (HYDRODIURIL) 25 MG tablet   levocetirizine (XYZAL) 5 MG tablet   lisinopril (ZESTRIL) 40 MG tablet   Menthol-Methyl Salicylate (TYLENOL PRECISE PAIN RELIEVING EX)   methotrexate (RHEUMATREX) 10 MG tablet   metoprolol succinate (TOPROL-XL) 50 MG 24 hr tablet   Simethicone (GAS-X PO)   SitaGLIPtin-MetFORMIN HCl (JANUMET XR) [phone] MG TB24   No current facility-administered medications for this encounter.     Shonna Chock, PA-C Surgical Short Stay/Anesthesiology [facility] Phone [phone] [facility] Phone [phone] 02/15/2023 10:14 AM

## 2023-02-15 NOTE — Anesthesia Preprocedure Evaluation (Addendum)
Anesthesia Evaluation  Patient identified by MRN, date of birth, ID band Patient awake    Reviewed: Allergy & Precautions  History of Anesthesia Complications (+) PONV, DIFFICULT AIRWAY and history of anesthetic complications  Airway Mallampati: II  TM Distance: >3 FB Neck ROM: Full    Dental no notable dental hx. (+) Teeth Intact, Dental Advisory Given   Pulmonary asthma , sleep apnea and Continuous Positive Airway Pressure Ventilation ,  COPD inhaler   Pulmonary exam normal breath sounds clear to auscultation       Cardiovascular hypertension, Pt. on medications and Pt. on home beta blockers Normal cardiovascular exam Rhythm:Regular Rate:Normal     Neuro/Psych    GI/Hepatic negative GI ROS, Neg liver ROS,,,  Endo/Other  diabetes, Oral Hypoglycemic Agents    Renal/GU      Musculoskeletal   Abdominal  (+) + obese (BMI 41.6)  Peds  Hematology   Anesthesia Other Findings   Reproductive/Obstetrics                             Anesthesia Physical Anesthesia Plan  ASA: 3  Anesthesia Plan: General   Post-op Pain Management: Ketamine IV* and Dilaudid IV   Induction: Intravenous  PONV Risk Score and Plan: Treatment may vary due to age or medical condition, Ondansetron, Propofol infusion and Midazolam  Airway Management Planned: Video Laryngoscope Planned and Oral ETT  Additional Equipment: Arterial line  Intra-op Plan:   Post-operative Plan: Extubation in OR  Informed Consent: I have reviewed the patients History and Physical, chart, labs and discussed the procedure including the risks, benefits and alternatives for the proposed anesthesia with the patient or authorized representative who has indicated his/her understanding and acceptance.     Dental advisory given  Plan Discussed with:   Anesthesia Plan Comments: (PAT note written 02/15/2023 by Shonna Chock, PA-C. 2 [phone]  IVs )       Anesthesia Quick Evaluation

## 2023-02-16 ENCOUNTER — Inpatient Hospital Stay (HOSPITAL_COMMUNITY): Payer: Medicare Other | Admitting: Anesthesiology

## 2023-02-16 ENCOUNTER — Encounter (HOSPITAL_COMMUNITY): Payer: Self-pay | Admitting: Neurosurgery

## 2023-02-16 ENCOUNTER — Inpatient Hospital Stay (HOSPITAL_COMMUNITY): Payer: Medicare Other | Admitting: Physician Assistant

## 2023-02-16 ENCOUNTER — Inpatient Hospital Stay (HOSPITAL_COMMUNITY)
Admission: RE | Admit: 2023-02-16 | Discharge: 2023-02-18 | DRG: 454 | Disposition: A | Discharge status: Home / Self Care | Payer: Medicare Other | Attending: Neurosurgery | Admitting: Neurosurgery

## 2023-02-16 ENCOUNTER — Encounter (HOSPITAL_COMMUNITY)
Admission: RE | Disposition: A | Discharge status: Home / Self Care | Payer: Self-pay | Source: Home / Self Care | Attending: Neurosurgery

## 2023-02-16 ENCOUNTER — Inpatient Hospital Stay (HOSPITAL_COMMUNITY): Payer: Medicare Other

## 2023-02-16 ENCOUNTER — Other Ambulatory Visit: Payer: Self-pay

## 2023-02-16 DIAGNOSIS — J45909 Unspecified asthma, uncomplicated: Secondary | ICD-10-CM | POA: Diagnosis present

## 2023-02-16 DIAGNOSIS — I10 Essential (primary) hypertension: Secondary | ICD-10-CM | POA: Diagnosis not present

## 2023-02-16 DIAGNOSIS — Z7984 Long term (current) use of oral hypoglycemic drugs: Secondary | ICD-10-CM

## 2023-02-16 DIAGNOSIS — E785 Hyperlipidemia, unspecified: Secondary | ICD-10-CM | POA: Diagnosis present

## 2023-02-16 DIAGNOSIS — Z79899 Other long term (current) drug therapy: Secondary | ICD-10-CM

## 2023-02-16 DIAGNOSIS — E669 Obesity, unspecified: Secondary | ICD-10-CM | POA: Diagnosis present

## 2023-02-16 DIAGNOSIS — Z888 Allergy status to other drugs, medicaments and biological substances status: Secondary | ICD-10-CM

## 2023-02-16 DIAGNOSIS — R911 Solitary pulmonary nodule: Secondary | ICD-10-CM | POA: Diagnosis present

## 2023-02-16 DIAGNOSIS — M47816 Spondylosis without myelopathy or radiculopathy, lumbar region: Secondary | ICD-10-CM | POA: Diagnosis present

## 2023-02-16 DIAGNOSIS — Z91040 Latex allergy status: Secondary | ICD-10-CM | POA: Diagnosis not present

## 2023-02-16 DIAGNOSIS — Z6841 Body Mass Index (BMI) 40.0 and over, adult: Secondary | ICD-10-CM

## 2023-02-16 DIAGNOSIS — E119 Type 2 diabetes mellitus without complications: Secondary | ICD-10-CM

## 2023-02-16 DIAGNOSIS — M532X6 Spinal instabilities, lumbar region: Secondary | ICD-10-CM

## 2023-02-16 DIAGNOSIS — G4730 Sleep apnea, unspecified: Secondary | ICD-10-CM | POA: Diagnosis present

## 2023-02-16 DIAGNOSIS — M48061 Spinal stenosis, lumbar region without neurogenic claudication: Secondary | ICD-10-CM

## 2023-02-16 DIAGNOSIS — Z7951 Long term (current) use of inhaled steroids: Secondary | ICD-10-CM

## 2023-02-16 LAB — GLUCOSE, CAPILLARY
Glucose-Capillary: 151 mg/dL — ABNORMAL HIGH (ref 70–99)
Glucose-Capillary: 107 mg/dL — ABNORMAL HIGH (ref 70–99)
Glucose-Capillary: 182 mg/dL — ABNORMAL HIGH (ref 70–99)
Glucose-Capillary: 232 mg/dL — ABNORMAL HIGH (ref 70–99)
Glucose-Capillary: 244 mg/dL — ABNORMAL HIGH (ref 70–99)

## 2023-02-16 SURGERY — POSTERIOR LUMBAR FUSION 2 WITH HARDWARE REMOVAL
Anesthesia: General | Site: Back

## 2023-02-16 MED ORDER — KETAMINE HCL 10 MG/ML IJ SOLN
INTRAMUSCULAR | Status: DC | PRN
  Administered 2023-02-16: 30 mg via INTRAVENOUS
  Administered 2023-02-16: 20 mg via INTRAVENOUS

## 2023-02-16 MED ORDER — THROMBIN 20000 UNITS EX SOLR: CUTANEOUS | Status: DC | PRN

## 2023-02-16 MED ORDER — CYCLOBENZAPRINE HCL 10 MG PO TABS
10.0000 mg | ORAL_TABLET | Freq: Three times a day (TID) | ORAL | Status: DC | PRN
  Administered 2023-02-16 – 2023-02-18 (×5): 10 mg via ORAL
  Filled 2023-02-16 (×5): qty 1

## 2023-02-16 MED ORDER — CHLORHEXIDINE GLUCONATE CLOTH 2 % EX PADS: 6.0000 | MEDICATED_PAD | Freq: Once | CUTANEOUS | Status: DC

## 2023-02-16 MED ORDER — MIDAZOLAM HCL 2 MG/2ML IJ SOLN
INTRAMUSCULAR | Status: AC
  Filled 2023-02-16: qty 2

## 2023-02-16 MED ORDER — MIDAZOLAM HCL 2 MG/2ML IJ SOLN
INTRAMUSCULAR | Status: DC | PRN
  Administered 2023-02-16: 2 mg via INTRAVENOUS

## 2023-02-16 MED ORDER — LIDOCAINE 2% (20 MG/ML) 5 ML SYRINGE
INTRAMUSCULAR | Status: DC | PRN
  Administered 2023-02-16: 80 mg via INTRAVENOUS

## 2023-02-16 MED ORDER — FENTANYL CITRATE (PF) 250 MCG/5ML IJ SOLN
INTRAMUSCULAR | Status: AC
  Filled 2023-02-16: qty 5

## 2023-02-16 MED ORDER — HYDROMORPHONE HCL 1 MG/ML IJ SOLN
INTRAMUSCULAR | Status: DC | PRN
  Administered 2023-02-16 (×2): .5 mg via INTRAVENOUS

## 2023-02-16 MED ORDER — ACETAMINOPHEN-CODEINE 300-15 MG PO TABS: 1.0000 | ORAL_TABLET | Freq: Three times a day (TID) | ORAL | Status: DC | PRN

## 2023-02-16 MED ORDER — FOLIC ACID 1 MG PO TABS
1.0000 mg | ORAL_TABLET | ORAL | Status: DC
  Administered 2023-02-17 – 2023-02-18 (×2): 1 mg via ORAL
  Filled 2023-02-16 (×2): qty 1

## 2023-02-16 MED ORDER — SODIUM CHLORIDE 0.9% FLUSH
3.0000 mL | Freq: Two times a day (BID) | INTRAVENOUS | Status: DC
  Administered 2023-02-16: 3 mL via INTRAVENOUS

## 2023-02-16 MED ORDER — BUPIVACAINE LIPOSOME 1.3 % IJ SUSP
INTRAMUSCULAR | Status: AC
  Filled 2023-02-16: qty 20

## 2023-02-16 MED ORDER — APREPITANT 40 MG PO CAPS
ORAL_CAPSULE | ORAL | Status: AC
  Filled 2023-02-16: qty 1

## 2023-02-16 MED ORDER — GLIPIZIDE ER 5 MG PO TB24
5.0000 mg | ORAL_TABLET | Freq: Every day | ORAL | Status: DC
  Administered 2023-02-17 – 2023-02-18 (×2): 5 mg via ORAL
  Filled 2023-02-16 (×2): qty 1

## 2023-02-16 MED ORDER — LIDOCAINE 2% (20 MG/ML) 5 ML SYRINGE
INTRAMUSCULAR | Status: AC
  Filled 2023-02-16: qty 5

## 2023-02-16 MED ORDER — SODIUM CHLORIDE 0.9 % IV SOLN: 250.0000 mL | INTRAVENOUS | Status: DC

## 2023-02-16 MED ORDER — DEXAMETHASONE SODIUM PHOSPHATE 10 MG/ML IJ SOLN
INTRAMUSCULAR | Status: DC | PRN
  Administered 2023-02-16: 10 mg via INTRAVENOUS

## 2023-02-16 MED ORDER — LINAGLIPTIN 5 MG PO TABS
5.0000 mg | ORAL_TABLET | Freq: Every evening | ORAL | Status: DC
  Administered 2023-02-16 – 2023-02-17 (×2): 5 mg via ORAL
  Filled 2023-02-16 (×3): qty 1

## 2023-02-16 MED ORDER — SIMETHICONE 80 MG PO CHEW: 80.0000 mg | CHEWABLE_TABLET | Freq: Every day | ORAL | Status: DC | PRN

## 2023-02-16 MED ORDER — INSULIN ASPART 100 UNIT/ML IJ SOLN
0.0000 [IU] | Freq: Every day | INTRAMUSCULAR | Status: DC
  Administered 2023-02-16: 2 [IU] via SUBCUTANEOUS

## 2023-02-16 MED ORDER — MENTHOL 3 MG MT LOZG: 1.0000 | LOZENGE | OROMUCOSAL | Status: DC | PRN

## 2023-02-16 MED ORDER — LACTATED RINGERS IV SOLN: INTRAVENOUS | Status: DC

## 2023-02-16 MED ORDER — ROCURONIUM BROMIDE 10 MG/ML (PF) SYRINGE
PREFILLED_SYRINGE | INTRAVENOUS | Status: DC | PRN
  Administered 2023-02-16: 80 mg via INTRAVENOUS
  Administered 2023-02-16: 20 mg via INTRAVENOUS

## 2023-02-16 MED ORDER — SODIUM CHLORIDE 0.9 % IV SOLN: INTRAVENOUS | Status: DC | PRN

## 2023-02-16 MED ORDER — ONDANSETRON HCL 4 MG/2ML IJ SOLN: 4.0000 mg | Freq: Four times a day (QID) | INTRAMUSCULAR | Status: DC | PRN

## 2023-02-16 MED ORDER — MOMETASONE FURO-FORMOTEROL FUM 200-5 MCG/ACT IN AERO
2.0000 | INHALATION_SPRAY | Freq: Two times a day (BID) | RESPIRATORY_TRACT | Status: DC
  Administered 2023-02-17 – 2023-02-18 (×2): 2 via RESPIRATORY_TRACT
  Filled 2023-02-16: qty 8.8

## 2023-02-16 MED ORDER — LIDOCAINE-EPINEPHRINE 1 %-1:100000 IJ SOLN
INTRAMUSCULAR | Status: AC
  Filled 2023-02-16: qty 1

## 2023-02-16 MED ORDER — ONDANSETRON HCL 4 MG/2ML IJ SOLN
INTRAMUSCULAR | Status: DC | PRN
  Administered 2023-02-16: 4 mg via INTRAVENOUS

## 2023-02-16 MED ORDER — INSULIN ASPART 100 UNIT/ML IJ SOLN
0.0000 [IU] | INTRAMUSCULAR | Status: DC | PRN
  Administered 2023-02-16: 2 [IU] via SUBCUTANEOUS

## 2023-02-16 MED ORDER — KETAMINE HCL 50 MG/5ML IJ SOSY
PREFILLED_SYRINGE | INTRAMUSCULAR | Status: AC
  Filled 2023-02-16: qty 5

## 2023-02-16 MED ORDER — LISINOPRIL 20 MG PO TABS
40.0000 mg | ORAL_TABLET | Freq: Every day | ORAL | Status: DC
  Administered 2023-02-17 – 2023-02-18 (×2): 40 mg via ORAL
  Filled 2023-02-16 (×2): qty 2

## 2023-02-16 MED ORDER — CEFAZOLIN SODIUM-DEXTROSE 2-4 GM/100ML-% IV SOLN
2.0000 g | INTRAVENOUS | Status: AC
  Administered 2023-02-16: 2 g via INTRAVENOUS
  Filled 2023-02-16: qty 100

## 2023-02-16 MED ORDER — PHENOL 1.4 % MT LIQD
1.0000 | OROMUCOSAL | Status: DC | PRN
  Administered 2023-02-16: 1 via OROMUCOSAL
  Filled 2023-02-16: qty 177

## 2023-02-16 MED ORDER — EMPAGLIFLOZIN 10 MG PO TABS
10.0000 mg | ORAL_TABLET | Freq: Every day | ORAL | Status: DC
  Administered 2023-02-17 – 2023-02-18 (×2): 10 mg via ORAL
  Filled 2023-02-16 (×2): qty 1

## 2023-02-16 MED ORDER — LIDOCAINE-EPINEPHRINE 1 %-1:100000 IJ SOLN
INTRAMUSCULAR | Status: DC | PRN
  Administered 2023-02-16: 7 mL

## 2023-02-16 MED ORDER — ROCURONIUM BROMIDE 10 MG/ML (PF) SYRINGE
PREFILLED_SYRINGE | INTRAVENOUS | Status: AC
  Filled 2023-02-16: qty 10

## 2023-02-16 MED ORDER — AZELASTINE HCL 0.1 % NA SOLN: 1.0000 | Freq: Every day | NASAL | Status: DC | PRN

## 2023-02-16 MED ORDER — ACETAMINOPHEN 500 MG PO TABS: 500.0000 mg | ORAL_TABLET | Freq: Four times a day (QID) | ORAL | Status: DC | PRN

## 2023-02-16 MED ORDER — APREPITANT 40 MG PO CAPS
40.0000 mg | ORAL_CAPSULE | Freq: Once | ORAL | Status: AC
  Administered 2023-02-16: 40 mg via ORAL
  Filled 2023-02-16: qty 1

## 2023-02-16 MED ORDER — BUPIVACAINE LIPOSOME 1.3 % IJ SUSP
INTRAMUSCULAR | Status: DC | PRN
  Administered 2023-02-16: 20 mL

## 2023-02-16 MED ORDER — SODIUM CHLORIDE 0.9% FLUSH: 3.0000 mL | INTRAVENOUS | Status: DC | PRN

## 2023-02-16 MED ORDER — INSULIN ASPART 100 UNIT/ML IJ SOLN
0.0000 [IU] | Freq: Three times a day (TID) | INTRAMUSCULAR | Status: DC
  Administered 2023-02-17: 5 [IU] via SUBCUTANEOUS
  Administered 2023-02-17 (×2): 3 [IU] via SUBCUTANEOUS

## 2023-02-16 MED ORDER — PROPOFOL 10 MG/ML IV BOLUS
INTRAVENOUS | Status: DC | PRN
  Administered 2023-02-16: 200 mg via INTRAVENOUS
  Administered 2023-02-16: 100 mg via INTRAVENOUS

## 2023-02-16 MED ORDER — ALBUTEROL SULFATE (2.5 MG/3ML) 0.083% IN NEBU: 3.0000 mL | INHALATION_SOLUTION | Freq: Four times a day (QID) | RESPIRATORY_TRACT | Status: DC | PRN

## 2023-02-16 MED ORDER — ACETAMINOPHEN 325 MG PO TABS: 650.0000 mg | ORAL_TABLET | ORAL | Status: DC | PRN

## 2023-02-16 MED ORDER — HYDROMORPHONE HCL 1 MG/ML IJ SOLN
0.5000 mg | INTRAMUSCULAR | Status: DC | PRN
  Administered 2023-02-16 (×2): 0.5 mg via INTRAVENOUS
  Filled 2023-02-16 (×2): qty 0.5

## 2023-02-16 MED ORDER — HYDROCODONE-ACETAMINOPHEN 5-325 MG PO TABS
2.0000 | ORAL_TABLET | ORAL | Status: DC | PRN
  Administered 2023-02-16 – 2023-02-18 (×9): 2 via ORAL
  Filled 2023-02-16 (×9): qty 2

## 2023-02-16 MED ORDER — 0.9 % SODIUM CHLORIDE (POUR BTL) OPTIME
TOPICAL | Status: DC | PRN
  Administered 2023-02-16: 1000 mL

## 2023-02-16 MED ORDER — AMLODIPINE BESYLATE 10 MG PO TABS
10.0000 mg | ORAL_TABLET | Freq: Every evening | ORAL | Status: DC
  Administered 2023-02-17: 10 mg via ORAL
  Filled 2023-02-16: qty 1

## 2023-02-16 MED ORDER — CHLORHEXIDINE GLUCONATE 0.12 % MT SOLN
15.0000 mL | Freq: Once | OROMUCOSAL | Status: AC
  Administered 2023-02-16: 15 mL via OROMUCOSAL
  Filled 2023-02-16: qty 15

## 2023-02-16 MED ORDER — ALUM & MAG HYDROXIDE-SIMETH 200-200-20 MG/5ML PO SUSP: 30.0000 mL | Freq: Four times a day (QID) | ORAL | Status: DC | PRN

## 2023-02-16 MED ORDER — PANTOPRAZOLE SODIUM 40 MG IV SOLR
40.0000 mg | Freq: Every day | INTRAVENOUS | Status: DC
  Administered 2023-02-16: 40 mg via INTRAVENOUS
  Filled 2023-02-16: qty 10

## 2023-02-16 MED ORDER — SITAGLIP PHOS-METFORMIN HCL ER 100-1000 MG PO TB24: 1.0000 | ORAL_TABLET | Freq: Every evening | ORAL | Status: DC

## 2023-02-16 MED ORDER — SUGAMMADEX SODIUM 200 MG/2ML IV SOLN
INTRAVENOUS | Status: DC | PRN
  Administered 2023-02-16: 200 mg via INTRAVENOUS

## 2023-02-16 MED ORDER — ATORVASTATIN CALCIUM 10 MG PO TABS
20.0000 mg | ORAL_TABLET | Freq: Every evening | ORAL | Status: DC
  Administered 2023-02-16 – 2023-02-17 (×2): 20 mg via ORAL
  Filled 2023-02-16 (×2): qty 2

## 2023-02-16 MED ORDER — KETOTIFEN FUMARATE 0.035 % OP SOLN
1.0000 [drp] | Freq: Two times a day (BID) | OPHTHALMIC | Status: DC
  Administered 2023-02-16 – 2023-02-18 (×4): 1 [drp] via OPHTHALMIC
  Filled 2023-02-16: qty 5

## 2023-02-16 MED ORDER — HYDROMORPHONE HCL 1 MG/ML IJ SOLN
INTRAMUSCULAR | Status: AC
  Filled 2023-02-16: qty 0.5

## 2023-02-16 MED ORDER — METFORMIN HCL ER 500 MG PO TB24
1000.0000 mg | ORAL_TABLET | Freq: Every evening | ORAL | Status: DC
  Administered 2023-02-16 – 2023-02-17 (×2): 1000 mg via ORAL
  Filled 2023-02-16 (×2): qty 2

## 2023-02-16 MED ORDER — CEFAZOLIN SODIUM-DEXTROSE 2-4 GM/100ML-% IV SOLN
2.0000 g | Freq: Three times a day (TID) | INTRAVENOUS | Status: AC
  Administered 2023-02-16 – 2023-02-17 (×2): 2 g via INTRAVENOUS
  Filled 2023-02-16 (×2): qty 100

## 2023-02-16 MED ORDER — METHOTREXATE SODIUM 2.5 MG PO TABS: 10.0000 mg | ORAL_TABLET | ORAL | Status: DC

## 2023-02-16 MED ORDER — SUCCINYLCHOLINE CHLORIDE 200 MG/10ML IV SOSY
PREFILLED_SYRINGE | INTRAVENOUS | Status: AC
  Filled 2023-02-16: qty 10

## 2023-02-16 MED ORDER — ONDANSETRON HCL 4 MG/2ML IJ SOLN
INTRAMUSCULAR | Status: AC
  Filled 2023-02-16: qty 2

## 2023-02-16 MED ORDER — METOPROLOL SUCCINATE ER 50 MG PO TB24
50.0000 mg | ORAL_TABLET | ORAL | Status: DC
  Administered 2023-02-16 – 2023-02-17 (×2): 50 mg via ORAL
  Filled 2023-02-16 (×2): qty 1

## 2023-02-16 MED ORDER — HYDROCHLOROTHIAZIDE 25 MG PO TABS
25.0000 mg | ORAL_TABLET | Freq: Every day | ORAL | Status: DC
  Administered 2023-02-17 – 2023-02-18 (×2): 25 mg via ORAL
  Filled 2023-02-16 (×2): qty 1

## 2023-02-16 MED ORDER — PHENYLEPHRINE HCL-NACL 20-0.9 MG/250ML-% IV SOLN
INTRAVENOUS | Status: DC | PRN
  Administered 2023-02-16: 50 ug/min via INTRAVENOUS

## 2023-02-16 MED ORDER — ACETAMINOPHEN 650 MG RE SUPP: 650.0000 mg | RECTAL | Status: DC | PRN

## 2023-02-16 MED ORDER — LORATADINE 10 MG PO TABS
10.0000 mg | ORAL_TABLET | Freq: Every evening | ORAL | Status: DC
  Administered 2023-02-16 – 2023-02-17 (×2): 10 mg via ORAL
  Filled 2023-02-16 (×2): qty 1

## 2023-02-16 MED ORDER — ORAL CARE MOUTH RINSE: 15.0000 mL | Freq: Once | OROMUCOSAL | Status: AC

## 2023-02-16 MED ORDER — THROMBIN 20000 UNITS EX SOLR
CUTANEOUS | Status: AC
  Filled 2023-02-16: qty 20000

## 2023-02-16 MED ORDER — DEXAMETHASONE SODIUM PHOSPHATE 10 MG/ML IJ SOLN
INTRAMUSCULAR | Status: AC
  Filled 2023-02-16: qty 1

## 2023-02-16 MED ORDER — ONDANSETRON HCL 4 MG PO TABS: 4.0000 mg | ORAL_TABLET | Freq: Four times a day (QID) | ORAL | Status: DC | PRN

## 2023-02-16 MED ORDER — FENTANYL CITRATE (PF) 250 MCG/5ML IJ SOLN
INTRAMUSCULAR | Status: DC | PRN
  Administered 2023-02-16: 50 ug via INTRAVENOUS
  Administered 2023-02-16 (×2): 100 ug via INTRAVENOUS

## 2023-02-16 MED ORDER — PROPOFOL 10 MG/ML IV BOLUS
INTRAVENOUS | Status: AC
  Filled 2023-02-16: qty 20

## 2023-02-16 SURGICAL SUPPLY — 78 items
ADH SKN CLS APL DERMABOND .7 (GAUZE/BANDAGES/DRESSINGS) ×1
APL SKNCLS STERI-STRIP NONHPOA (GAUZE/BANDAGES/DRESSINGS) ×1
BAG COUNTER SPONGE SURGICOUNT (BAG) ×1 IMPLANT
BAG SPNG CNTER NS LX DISP (BAG) ×1
BASKET BONE COLLECTION (BASKET) ×1 IMPLANT
BENZOIN TINCTURE PRP APPL 2/3 (GAUZE/BANDAGES/DRESSINGS) ×1 IMPLANT
BLADE BONE MILL MEDIUM (MISCELLANEOUS) ×1 IMPLANT
BLADE CLIPPER SURG (BLADE) IMPLANT
BLADE SURG 11 STRL SS (BLADE) ×1 IMPLANT
BONE VIVIGEN FORMABLE 5.4CC (Bone Implant) ×1 IMPLANT
BUR CUTTER 7.0 ROUND (BURR) ×1 IMPLANT
BUR MATCHSTICK NEURO 3.0 LAGG (BURR) ×1 IMPLANT
CAGE ALTERA 10X26 9-13 8D (Cage) IMPLANT
CANISTER SUCT 3000ML PPV (MISCELLANEOUS) ×1 IMPLANT
CAP LOCKING THREADED (Cap) IMPLANT
CNTNR URN SCR LID CUP LEK RST (MISCELLANEOUS) ×1 IMPLANT
CONT SPEC 4OZ STRL OR WHT (MISCELLANEOUS) ×1
COVER BACK TABLE 24X17X13 BIG (DRAPES) IMPLANT
COVER BACK TABLE 60X90IN (DRAPES) ×1 IMPLANT
DERMABOND ADVANCED .7 DNX12 (GAUZE/BANDAGES/DRESSINGS) ×1 IMPLANT
DRAPE C-ARM 42X72 X-RAY (DRAPES) ×1 IMPLANT
DRAPE C-ARMOR (DRAPES) IMPLANT
DRAPE HALF SHEET 40X57 (DRAPES) IMPLANT
DRAPE LAPAROTOMY 100X72X124 (DRAPES) ×1 IMPLANT
DRAPE SURG 17X23 STRL (DRAPES) ×1 IMPLANT
DRSG OPSITE 4X5.5 SM (GAUZE/BANDAGES/DRESSINGS) ×1 IMPLANT
DRSG OPSITE POSTOP 4X6 (GAUZE/BANDAGES/DRESSINGS) ×1 IMPLANT
DURAPREP 26ML APPLICATOR (WOUND CARE) ×1 IMPLANT
ELECT REM PT RETURN 9FT ADLT (ELECTROSURGICAL) ×1
ELECTRODE REM PT RTRN 9FT ADLT (ELECTROSURGICAL) ×1 IMPLANT
EVACUATOR 1/8 PVC DRAIN (DRAIN) IMPLANT
EVACUATOR 3/16 PVC DRAIN (DRAIN) ×1 IMPLANT
GAUZE 4X4 16PLY ~~LOC~~+RFID DBL (SPONGE) IMPLANT
GAUZE SPONGE 4X4 12PLY STRL (GAUZE/BANDAGES/DRESSINGS) ×1 IMPLANT
GLOVE BIO SURGEON STRL SZ7 (GLOVE) IMPLANT
GLOVE BIO SURGEON STRL SZ8 (GLOVE) ×2 IMPLANT
GLOVE BIOGEL PI IND STRL 7.0 (GLOVE) IMPLANT
GLOVE EXAM NITRILE XL STR (GLOVE) IMPLANT
GLOVE INDICATOR 8.5 STRL (GLOVE) ×2 IMPLANT
GOWN STRL REUS W/ TWL LRG LVL3 (GOWN DISPOSABLE) IMPLANT
GOWN STRL REUS W/ TWL XL LVL3 (GOWN DISPOSABLE) ×2 IMPLANT
GOWN STRL REUS W/TWL 2XL LVL3 (GOWN DISPOSABLE) IMPLANT
GOWN STRL REUS W/TWL LRG LVL3 (GOWN DISPOSABLE)
GOWN STRL REUS W/TWL XL LVL3 (GOWN DISPOSABLE) ×2
GRAFT BNE MATRIX VG FRMBL MD 5 (Bone Implant) IMPLANT
KIT BASIN OR (CUSTOM PROCEDURE TRAY) ×1 IMPLANT
KIT GRAFTMAG DEL NEURO DISP (NEUROSURGERY SUPPLIES) IMPLANT
KIT INFUSE X SMALL 1.4CC (Orthopedic Implant) IMPLANT
KIT TURNOVER KIT B (KITS) ×1 IMPLANT
MILL BONE PREP (MISCELLANEOUS) ×1 IMPLANT
NDL HYPO 21X1.5 SAFETY (NEEDLE) ×1 IMPLANT
NDL HYPO 25X1 1.5 SAFETY (NEEDLE) ×1 IMPLANT
NEEDLE HYPO 21X1.5 SAFETY (NEEDLE) ×1 IMPLANT
NEEDLE HYPO 25X1 1.5 SAFETY (NEEDLE) ×1 IMPLANT
NS IRRIG 1000ML POUR BTL (IV SOLUTION) ×1 IMPLANT
PACK LAMINECTOMY NEURO (CUSTOM PROCEDURE TRAY) ×1 IMPLANT
PAD ARMBOARD 7.5X6 YLW CONV (MISCELLANEOUS) ×3 IMPLANT
ROD 85MM SPINAL (Rod) IMPLANT
ROD TI CVD 5.5MMX90MM (Rod) IMPLANT
SCREW CORTICAL CREO 5.5-4.5X30 (Screw) IMPLANT
SCREW PA THRD CREO TULIP 5.5X4 (Head) IMPLANT
SHAFT CREO 30MM (Neuro Prosthesis/Implant) IMPLANT
SOL ELECTROSURG ANTI STICK (MISCELLANEOUS) ×1
SOLUTION ELECTROSURG ANTI STCK (MISCELLANEOUS) ×1 IMPLANT
SPACER ALTERA 26MM 10X14-8 (Spacer) IMPLANT
SPIKE FLUID TRANSFER (MISCELLANEOUS) ×1 IMPLANT
SPONGE SURGIFOAM ABS GEL 100 (HEMOSTASIS) ×1 IMPLANT
SPONGE T-LAP 4X18 ~~LOC~~+RFID (SPONGE) IMPLANT
STRIP CLOSURE SKIN 1/2X4 (GAUZE/BANDAGES/DRESSINGS) ×2 IMPLANT
SUT VIC AB 0 CT1 18XCR BRD8 (SUTURE) ×1 IMPLANT
SUT VIC AB 0 CT1 8-18 (SUTURE) ×2
SUT VIC AB 2-0 CT1 18 (SUTURE) ×1 IMPLANT
SUT VIC AB 4-0 PS2 27 (SUTURE) ×1 IMPLANT
SYR 20ML LL LF (SYRINGE) ×1 IMPLANT
TOWEL GREEN STERILE (TOWEL DISPOSABLE) ×1 IMPLANT
TOWEL GREEN STERILE FF (TOWEL DISPOSABLE) ×1 IMPLANT
TRAY FOLEY MTR SLVR 16FR STAT (SET/KITS/TRAYS/PACK) ×1 IMPLANT
WATER STERILE IRR 1000ML POUR (IV SOLUTION) ×1 IMPLANT

## 2023-02-16 NOTE — H&P (Signed)
Shari Hill is an 67 y.o. female.   Chief Complaint: Back and right leg pain HPI: 67 year old female progressive with progressive worsening back and right leg pain patient previously undergone L5-S1 fusion and developed recurrent back and right L5 radicular symptoms workup revealed progressive degeneration segmental breakdown above her fusion at L4-5 and at L3-4.  Due to patient's progression of clinical syndrome imaging findings of a conservative treatment I recommended extension of fusion with interbody fusions at L3-4 and L4-5.  I extensively reviewed the risks and benefits of the operation with the patient as well as perioperative course expectations of outcome and alternatives of surgery and she understands and agrees to proceed forward.  Past Medical History:  Diagnosis Date   Asthma    Diabetes mellitus without complication (HCC)    Difficult intubation 05/19/2011   "Unsuccessfully used Mac 3, Glidescope 4, Glydescope 3. Successfully used Fiberoptic scope"   Hyperlipidemia    Hypertension    Multinodular thyroid    Nodule of left lung    PONV (postoperative nausea and vomiting)    Rosai-Dorfman disease (HCC)    Sleep apnea     Past Surgical History:  Procedure Laterality Date   APPENDECTOMY     CHOLECYSTECTOMY     EXPLORATORY LAPAROTOMY     MEDIAL PARTIAL KNEE REPLACEMENT  09/05/2020   NASAL SINUS SURGERY  2005   ROTATOR CUFF REPAIR Left     No family history on file. Social History:  reports that she has never smoked. She has never used smokeless tobacco. She reports that she does not currently use alcohol after a past usage of about 1.0 standard drink of alcohol per week. She reports that she does not use drugs.  Allergies:  Allergies  Allergen Reactions   Zocor [Simvastatin] Other (See Comments)    Unknown reaction per pt    Latex Hives and Rash   Other Hives, Itching, Swelling and Rash    Dissolvable stiches     Medications Prior to Admission  Medication Sig  Dispense Refill   acetaminophen (TYLENOL) 500 MG tablet Take 500-1,000 mg by mouth every 6 (six) hours as needed (pain.).     acetaminophen-codeine (TYLENOL #2) 300-15 MG tablet Take 1 tablet by mouth 3 (three) times daily as needed for moderate pain.     albuterol (VENTOLIN HFA) 108 (90 Base) MCG/ACT inhaler Inhale into the lungs every 6 (six) hours as needed for wheezing or shortness of breath.     amLODipine (NORVASC) 10 MG tablet Take 10 mg by mouth every evening.     atorvastatin (LIPITOR) 20 MG tablet Take 20 mg by mouth every evening.     azelastine (OPTIVAR) 0.05 % ophthalmic solution Place 1 drop into both eyes 2 (two) times daily as needed (allergies).     Azelastine HCl 137 MCG/SPRAY SOLN Place 1 spray into both nostrils daily as needed (allergies).     budesonide-formoterol (SYMBICORT) 160-4.5 MCG/ACT inhaler Inhale 2 puffs into the lungs 2 (two) times daily as needed (respiratory issues.).     empagliflozin (JARDIANCE) 10 MG TABS tablet Take 10 mg by mouth in the morning.     EPINEPHrine 0.3 mg/0.3 mL IJ SOAJ injection Inject 0.3 mg into the muscle as needed for anaphylaxis.     folic acid (FOLVITE) 1 MG tablet Take 1 tablet (1 mg total) by mouth daily. Do not take on day you take methotrexate (Patient taking differently: Take 1 mg by mouth See admin instructions. Take 1 mg 6 days a  week, skip Monday dose) 26 tablet 3   glipiZIDE (GLUCOTROL XL) 5 MG 24 hr tablet Take 5 mg by mouth in the morning.     hydrochlorothiazide (HYDRODIURIL) 25 MG tablet Take 25 mg by mouth in the morning.     levocetirizine (XYZAL) 5 MG tablet Take 5 mg by mouth every evening.     lisinopril (ZESTRIL) 40 MG tablet Take 40 mg by mouth in the morning.     Menthol-Methyl Salicylate (TYLENOL PRECISE PAIN RELIEVING EX) Apply 1 Application topically daily as needed (pain).     methotrexate (RHEUMATREX) 10 MG tablet Take 1 tablet (10 mg total) by mouth once a week. Caution: Chemotherapy. Protect from light. 4 tablet  0   metoprolol succinate (TOPROL-XL) 50 MG 24 hr tablet Take 50 mg by mouth every evening.     Simethicone (GAS-X PO) Take 1 tablet by mouth daily as needed (gas).     SitaGLIPtin-MetFORMIN HCl (JANUMET XR) [phone] MG TB24 Take 1 tablet by mouth every evening.      Results for orders placed or performed during the hospital encounter of 02/16/23 (from the past 48 hour(s))  Glucose, capillary     Status: Abnormal   Collection Time: 02/16/23  9:36 AM  Result Value Ref Range   Glucose-Capillary 151 (H) 70 - 99 mg/dL    Comment: Glucose reference range applies only to samples taken after fasting for at least 8 hours.   Comment 1 Notify RN    No results found.  Review of Systems  Musculoskeletal:  Positive for back pain.  Neurological:  Positive for numbness.    Pulse 80, temperature 98.3 F (36.8 C), temperature source Oral, resp. rate 18, height 5\' 3"  (1.6 m), weight 106.1 kg, SpO2 95 %. Physical Exam HENT:     Head: Normocephalic.     Right Ear: Tympanic membrane normal.     Mouth/Throat:     Mouth: Mucous membranes are moist.  Eyes:     Pupils: Pupils are equal, round, and reactive to light.  Cardiovascular:     Rate and Rhythm: Normal rate.  Pulmonary:     Effort: Pulmonary effort is normal.  Abdominal:     General: Abdomen is flat.  Musculoskeletal:        General: Normal range of motion.     Cervical back: Normal range of motion.  Neurological:     Mental Status: She is alert.     Comments: Strength is 5 out of 5 iliopsoas, quads, hamstrings, gastrocs, into tibialis, and EHL.      Assessment/Plan 67 year old presents for interbody fusions L3-4 L4-5  Mariam Dollar, MD 02/16/2023, 10:19 AM

## 2023-02-16 NOTE — Transfer of Care (Signed)
Immediate Anesthesia Transfer of Care Note  Patient: Javon Hupfer  Procedure(s) Performed: POSTERIOR LUMBAR INTERBODY FUSION LUMBAR THREE-FOUR/LUMBAR FOOOUR-FIVE extension and removal LUMBAR FIVE-SACRAL ONE- Posterior Lateral and Interbody fusion (Back)  Patient Location: PACU  Anesthesia Type:General  Level of Consciousness: drowsy and patient cooperative  Airway & Oxygen Therapy: Patient Spontanous Breathing  Post-op Assessment: Report given to RN and Post -op Vital signs reviewed and stable  Post vital signs: Reviewed and stable  Last Vitals:  Vitals Value Taken Time  BP 138/88 02/16/23 1600  Temp    Pulse 91 02/16/23 1601  Resp 22 02/16/23 1601  SpO2 97 % 02/16/23 1601  Vitals shown include unvalidated device data.  Last Pain:  Vitals:   02/16/23 1032  TempSrc:   PainSc: 0-No pain         Complications: No notable events documented.

## 2023-02-16 NOTE — Anesthesia Postprocedure Evaluation (Signed)
Anesthesia Post Note  Patient: Shari Hill  Procedure(s) Performed: POSTERIOR LUMBAR INTERBODY FUSION LUMBAR THREE-FOUR/LUMBAR FOOOUR-FIVE extension and removal LUMBAR FIVE-SACRAL ONE- Posterior Lateral and Interbody fusion (Back)     Patient location during evaluation: PACU Anesthesia Type: General Level of consciousness: awake and alert Pain management: pain level controlled Vital Signs Assessment: post-procedure vital signs reviewed and stable Respiratory status: spontaneous breathing, nonlabored ventilation, respiratory function stable and patient connected to nasal cannula oxygen Cardiovascular status: blood pressure returned to baseline and stable Postop Assessment: no apparent nausea or vomiting Anesthetic complications: yes   Encounter Notable Events  Notable Event Outcome Phase Comment  Difficult to intubate - expected  Intraprocedure Filed from anesthesia note documentation.    Last Vitals:  Vitals:   02/16/23 1700 02/16/23 1720  BP: (!) 146/68 (!) 145/71  Pulse: 86 84  Resp: 15 16  Temp:  36.7 C  SpO2: 95% 98%    Last Pain:  Vitals:   02/16/23 1810  TempSrc:   PainSc: 8                  Trevor Iha

## 2023-02-16 NOTE — Anesthesia Procedure Notes (Signed)
Procedure Name: Intubation Date/Time: 02/16/2023 11:28 AM  Performed by: Rosiland Oz, CRNAPre-anesthesia Checklist: Patient identified, Emergency Drugs available, Suction available, Patient being monitored and Timeout performed Patient Re-evaluated:Patient Re-evaluated prior to induction Oxygen Delivery Method: Circle system utilized Preoxygenation: Pre-oxygenation with 100% oxygen Induction Type: IV induction Ventilation: Mask ventilation without difficulty Laryngoscope Size: Glidescope and 3 Grade View: Grade II Tube type: Parker flex tip Tube size: 7.0 mm Number of attempts: 3 Airway Equipment and Method: Stylet and Video-laryngoscopy Placement Confirmation: ETT inserted through vocal cords under direct vision, positive ETCO2 and breath sounds checked- equal and bilateral Secured at: 21 cm Tube secured with: Tape Dental Injury: Bloody posterior oropharynx  Difficulty Due To: Difficulty was anticipated, Difficult Airway- due to large tongue and Difficult Airway- due to reduced neck mobility

## 2023-02-16 NOTE — Op Note (Addendum)
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  Postoperative diagnosis: Same.  Procedure: #1 left-sided decompressive laminectomy with complete medial facetectomy and radical foraminotomies at L3-4 in excess and requiring more work than would be needed with a standard interbody fusion.  2.  Decompressive laminectomy bilateral L4-5 with complete medial facetectomies redo foraminotomies of the L5 nerve root and radical foraminotomies of the L4 nerve root in excess and requiring more work than would be needed with a standard interbody fusion.  3.  Transforaminal interbody fusions L3-4 L4-5 utilizing globus Altera expandable cage system packed with locally harvested autograft mixed with Vivigen and BMP.  4.  Cortical screw fixation L3-S1 with utilizing existing screws at L5 and S1 and extension with longer rods removal of the nuts.  5.  Posterior lateral arthrodesis L3-4 L4-5 utilizing the locally harvested autograft mixed with BMP.  Surgeon: Donalee Citrin.  Assistant: Coletta Memos.  Assistant 2: Julien Girt  EBL:  Anesthesia: General  HPI: 67 year old female previously undergone L5-S1 fusion and presented with progressive worsening back pain and bilateral right greater than left L5 radicular pain workup revealed progressive segmental degeneration instability at L4-5 above her fusion as well as stenosis when spondylosis at L3-4.  Due to patient's progression of clinical syndrome imaging findings of a conservative treatment I recommended decompressive laminectomies and interbody fusions at L3-4 and L4-5.  I extensively reviewed the risks and benefits of the operation with the patient as well as perioperative course expectations of outcome and alternatives of surgery and she understood and agreed to proceed forward.  Operative procedure: Patient was brought into the OR was induced under general anesthesia positioned prone the Wilson frame her back was  prepped and draped in routine sterile fashion roll incision was opened up and extended cephalad caudally scar tissue was dissected free and subperiosteal dissection was carried lamina of L3-L4 and exposed hardware from L5-S1.  I then removed the spinous process at L4 performed a central decompression at L4-5 with complete medial facetectomies and radical foraminotomies.  I then also extended and performed a left-sided complete decompressive laminectomy with complete medial facetectomy at L3-4.  I left the lamina on the right intact.  Epidural veins were then coagulated disc base was then cleaned out at L4-5 utilizing sequential distraction identified the size implant and cleaned out the disc base prepared the endplates and inserted the Altera transforaminal interbody cage after being packed with locally harvested autograft from the right and I packed an extensive mount of autograft and BMP anterior and lateral to the cage.  The cage was inserted and under fluoroscopy was expanded the then after adequate bit position but achieved tension taken to 3 4 in a similar fashion inserted the left-sided Altera cage after endplate preparation with an extensive mount of autograft and BMP packed centrally and laterally I then placed cortical screws at L3 and L4.  And then connected everything altogether.  I also aggressively decorticated the facet joints at L3-L4 and L5 and packed and extensive mount of autograft posterior laterally along the lateral aspect of the facet joints and in the facet joint and along the lamina on the right at L3-4.  Then after all the rods were placed I compressed L4 against L5 on the right and L3 against L4 on the left and all the rods were anchored in place.  I then inspected the foramina to confirm patency and no migration of graft material then lay Gelfoam and top of the dura injected Exparel  in the fascia placed a medium Hemovac drain and closed the wound in layers with interrupted Vicryl and a  running 4 subcuticular.  Dr. Franky Macho was present during the critical parts of the decompression and fusion.  Dermabond benzoin Steri-Strips and a sterile dressing was applied patient recovery in stable condition.  At the end the case all needle count sponge counts were correct.

## 2023-02-16 NOTE — Plan of Care (Signed)

## 2023-02-17 ENCOUNTER — Other Ambulatory Visit: Payer: Self-pay

## 2023-02-17 ENCOUNTER — Encounter (HOSPITAL_COMMUNITY): Payer: Self-pay | Admitting: Neurosurgery

## 2023-02-17 LAB — GLUCOSE, CAPILLARY
Glucose-Capillary: 217 mg/dL — ABNORMAL HIGH (ref 70–99)
Glucose-Capillary: 166 mg/dL — ABNORMAL HIGH (ref 70–99)
Glucose-Capillary: 162 mg/dL — ABNORMAL HIGH (ref 70–99)
Glucose-Capillary: 138 mg/dL — ABNORMAL HIGH (ref 70–99)

## 2023-02-17 MED ORDER — DIPHENHYDRAMINE HCL 25 MG PO CAPS
25.0000 mg | ORAL_CAPSULE | Freq: Four times a day (QID) | ORAL | Status: DC | PRN
  Administered 2023-02-17 – 2023-02-18 (×4): 25 mg via ORAL
  Filled 2023-02-17 (×4): qty 1

## 2023-02-17 MED ORDER — PANTOPRAZOLE SODIUM 40 MG PO TBEC
40.0000 mg | DELAYED_RELEASE_TABLET | Freq: Every day | ORAL | Status: DC
  Administered 2023-02-17: 40 mg via ORAL
  Filled 2023-02-17: qty 1

## 2023-02-17 NOTE — Evaluation (Signed)
Physical Therapy Evaluation Patient Details Name: Shari Hill MRN: [id number] DOB: 05/20/1956 Today's Date: 02/17/2023  History of Present Illness  Pt is a 67 y.o. female s/p PLIF L3-5 extension and removal of L5-S1 PLIF (02/16/2023). PMH significant for lumbosacral spinal surgery, asthma, DM, HLD, HTN, nodule L lung, Rosai Dorfman disease, L rotator cuff repair, partial knee replacement.   Clinical Impression  At time of evaluation, patient received supine with HOB elevated A&Ox4. Prior to admission, patient was independent with all functional mobility and ADLs. She is supported by her Sps who is available to assist PRN following discharge. She has RW at home from previous surgery; discussed with pt on use of RW in order to promote safety. Currently patient able to perform bed mobility with minA only for LE management returning to supine. Able to transfer and amb without physical assistance from PT. End of session included education on car transfers and exercise progression; pt able to recall spinal precautions 3/3. Recommending patient to return to home with family support as needed.      Recommendations for follow up therapy are one component of a multi-disciplinary discharge planning process, led by the attending physician.  Recommendations may be updated based on patient status, additional functional criteria and insurance authorization.  Follow Up Recommendations       Assistance Recommended at Discharge PRN  Patient can return home with the following  A little help with walking and/or transfers;Help with stairs or ramp for entrance;Assist for transportation    Equipment Recommendations None recommended by PT  Recommendations for Other Services       Functional Status Assessment Patient has had a recent decline in their functional status and demonstrates the ability to make significant improvements in function in a reasonable and predictable amount of time.     Precautions /  Restrictions Precautions Precautions: Back Precaution Booklet Issued: Yes (comment) Precaution Comments: Reviewed handout and verbalized throughout functional mobility Required Braces or Orthoses: Spinal Brace Spinal Brace: Lumbar corset;Applied in standing position (No brace needed orders, but LSO in room) Restrictions Weight Bearing Restrictions: No      Mobility  Bed Mobility Overal bed mobility: Needs Assistance Bed Mobility: Rolling, Sidelying to Sit, Sit to Sidelying Rolling: Supervision Sidelying to sit: Supervision     Sit to sidelying: Min assist General bed mobility comments: Able to progress to supervision on bed mobilty from supine > EOB. Needed min assist for LE management from EOB to sidelying. Pt able to maintain precautions throughout    Transfers Overall transfer level: Needs assistance Equipment used: None, Rolling walker (2 wheels) Transfers: Sit to/from Stand Sit to Stand: Supervision           General transfer comment: Patient able to sit <> stand without physical assistance, with close supervision provded.    Ambulation/Gait Ambulation/Gait assistance: Min guard Gait Distance (Feet): 150 Feet Assistive device: Rolling walker (2 wheels) Gait Pattern/deviations: Step-to pattern, Step-through pattern, Decreased stride length, Trunk flexed Gait velocity: Decreased Gait velocity interpretation: <1.8 ft/sec, indicate of risk for recurrent falls   General Gait Details: Initial presentation with trunk flexed and step to pattern. Improved with prolonged ambulation, progressed to step through pattern with more upright stance.  Stairs Stairs: Yes Stairs assistance: Min guard Stair Management: One rail Left, Step to pattern, Forwards Number of Stairs: 4 General stair comments: Pt navigated steps without physical assistance but PT provided min guard for safety. Descent presented very cautious with step to pattern.  Wheelchair Mobility    Modified  Rankin (Stroke Patients Only)       Balance Overall balance assessment: Needs assistance Sitting-balance support: No upper extremity supported, Feet supported Sitting balance-Leahy Scale: Fair     Standing balance support: Bilateral upper extremity supported, No upper extremity supported, During functional activity Standing balance-Leahy Scale: Fair Standing balance comment: Utilizes RW throughout functional mobility                             Pertinent Vitals/Pain Pain Assessment Pain Assessment: 0-10 Pain Score: 3  Pain Location: General Lower Back Pain Descriptors / Indicators: Aching, Sore, Discomfort, Grimacing Pain Intervention(s): Limited activity within patient's tolerance, Monitored during session    Home Living Family/patient expects to be discharged to:: Private residence Living Arrangements: Spouse/significant other Available Help at Discharge: Family Type of Home: House Home Access: Stairs to enter Entrance Stairs-Rails: Left Entrance Stairs-Number of Steps: 3   Home Layout: One level Home Equipment: Agricultural consultant (2 wheels);Shower seat Additional Comments: sleep number adjustable bed    Prior Function Prior Level of Function : Independent/Modified Independent             Mobility Comments: Able to walk without RW support prior to surgery ADLs Comments: Independent     Hand Dominance        Extremity/Trunk Assessment   Upper Extremity Assessment Upper Extremity Assessment: Defer to OT evaluation    Lower Extremity Assessment Lower Extremity Assessment: Overall WFL for tasks assessed    Cervical / Trunk Assessment Cervical / Trunk Assessment: Back Surgery (x2 (first one Jan 5))  Communication   Communication: No difficulties  Cognition Arousal/Alertness: Awake/alert Behavior During Therapy: WFL for tasks assessed/performed Overall Cognitive Status: Within Functional Limits for tasks assessed                                  General Comments: Recalled precautions 3/3 after functional mobility        General Comments General comments (skin integrity, edema, etc.): Wound VAC unplugged at end of session from hose connector; RN notified    Exercises     Assessment/Plan    PT Assessment Patient does not need any further PT services  PT Problem List         PT Treatment Interventions      PT Goals (Current goals can be found in the Care Plan section)  Acute Rehab PT Goals Patient Stated Goal: Patient would like to return home. PT Goal Formulation: All assessment and education complete, DC therapy    Frequency       Co-evaluation               AM-PAC PT "6 Clicks" Mobility  Outcome Measure Help needed turning from your back to your side while in a flat bed without using bedrails?: None Help needed moving from lying on your back to sitting on the side of a flat bed without using bedrails?: None Help needed moving to and from a bed to a chair (including a wheelchair)?: None Help needed standing up from a chair using your arms (e.g., wheelchair or bedside chair)?: None Help needed to walk in hospital room?: A Little Help needed climbing 3-5 steps with a railing? : A Little 6 Click Score: 22    End of Session Equipment Utilized During Treatment: Gait belt;Back brace Activity Tolerance: Patient tolerated treatment well Patient left: in bed;with call bell/phone  within reach Nurse Communication: Mobility status PT Visit Diagnosis: Muscle weakness (generalized) (M62.81);Difficulty in walking, not elsewhere classified (R26.2);Pain Pain - Right/Left: Left Pain - part of body: Leg    Time: 7425-9563 PT Time Calculation (min) (ACUTE ONLY): 25 min   Charges:   PT Evaluation $PT Eval Low Complexity: 1 Low PT Treatments $Gait Training: 8-22 mins        Christene Lye, SPT Acute Rehabilitation Services [phone] Secure chat preferred    Christene Lye 02/17/2023, 1:06  PM

## 2023-02-17 NOTE — Progress Notes (Signed)
Subjective: Patient reports  condition of back pain and some anterior quad soreness  Objective: Vital signs in last 24 hours: Temp:  [98.1 F (36.7 C)-98.5 F (36.9 C)] 98.5 F (36.9 C) (06/27 0348) Pulse Rate:  [73-97] 73 (06/27 0348) Resp:  [15-18] 18 (06/27 0348) BP: (124-153)/(68-88) 141/70 (06/27 0348) SpO2:  [94 %-98 %] 95 % (06/26 2343) Weight:  [106.1 kg] 106.1 kg (06/26 0935)  Intake/Output from previous day: 06/26 0701 - 06/27 0700 In: 2795 [I.V.:2450; Blood:240; IV Piggyback:105] Out: 1400 [Urine:500; Drains:300; Blood:600] Intake/Output this shift: No intake/output data recorded.  Strength 5 out of 5 wound clean dry and intact  Lab Results: No results for input(s): "WBC", "HGB", "HCT", "PLT" in the last 72 hours. BMET No results for input(s): "NA", "K", "CL", "CO2", "GLUCOSE", "BUN", "CREATININE", "CALCIUM" in the last 72 hours.  Studies/Results: DG Lumbar Spine 2-3 Views  Result Date: 02/16/2023 CLINICAL DATA:  Surgical fusion from L3-L5. EXAM: LUMBAR SPINE - 2-3 VIEW; DG C-ARM 1-60 MIN-NO REPORT Radiation exposure index: 39.14 mGy. COMPARISON:  October 14, 2022. FINDINGS: Two intraoperative fluoroscopic images were obtained of the lumbar spine. These demonstrate intrapedicular screw placement at L3 and L4 with interbody fusion of L3-4 and L4-5. IMPRESSION: Fluoroscopic guidance provided during lumbar surgery as described above. Electronically Signed   By: Lupita Raider M.D.   On: 02/16/2023 15:41   DG C-Arm 1-60 Min-No Report  Result Date: 02/16/2023 CLINICAL DATA:  Surgical fusion from L3-L5. EXAM: LUMBAR SPINE - 2-3 VIEW; DG C-ARM 1-60 MIN-NO REPORT Radiation exposure index: 39.14 mGy. COMPARISON:  October 14, 2022. FINDINGS: Two intraoperative fluoroscopic images were obtained of the lumbar spine. These demonstrate intrapedicular screw placement at L3 and L4 with interbody fusion of L3-4 and L4-5. IMPRESSION: Fluoroscopic guidance provided during lumbar  surgery as described above. Electronically Signed   By: Lupita Raider M.D.   On: 02/16/2023 15:41   DG C-Arm 1-60 Min-No Report  Result Date: 02/16/2023 CLINICAL DATA:  Surgical fusion from L3-L5. EXAM: LUMBAR SPINE - 2-3 VIEW; DG C-ARM 1-60 MIN-NO REPORT Radiation exposure index: 39.14 mGy. COMPARISON:  October 14, 2022. FINDINGS: Two intraoperative fluoroscopic images were obtained of the lumbar spine. These demonstrate intrapedicular screw placement at L3 and L4 with interbody fusion of L3-4 and L4-5. IMPRESSION: Fluoroscopic guidance provided during lumbar surgery as described above. Electronically Signed   By: Lupita Raider M.D.   On: 02/16/2023 15:41    Assessment/Plan: Postop day 1 TLIF L3-4 L4-5 doing well significant proven preoperative radicular pain condition of back pain and still with increased drain output.  Mobilize with physical therapy and continue observation another day probable DC tomorrow  LOS: 1 day     Mariam Dollar 02/17/2023, 7:28 AM

## 2023-02-17 NOTE — Plan of Care (Signed)
  Problem: Education: Goal: Knowledge of General Education information will improve Description: Including pain rating scale, medication(s)/side effects and non-pharmacologic comfort measures Outcome: Progressing   Problem: Health Behavior/Discharge Planning: Goal: Ability to manage health-related needs will improve Outcome: Progressing   Problem: Clinical Measurements: Goal: Ability to maintain clinical measurements within normal limits will improve Outcome: Progressing Goal: Will remain free from infection Outcome: Progressing Goal: Diagnostic test results will improve Outcome: Progressing Goal: Respiratory complications will improve Outcome: Progressing Goal: Cardiovascular complication will be avoided Outcome: Progressing   Problem: Activity: Goal: Risk for activity intolerance will decrease Outcome: Progressing   Problem: Nutrition: Goal: Adequate nutrition will be maintained Outcome: Progressing   Problem: Coping: Goal: Level of anxiety will decrease Outcome: Progressing   Problem: Elimination: Goal: Will not experience complications related to bowel motility Outcome: Progressing Goal: Will not experience complications related to urinary retention Outcome: Progressing   Problem: Pain Managment: Goal: General experience of comfort will improve Outcome: Progressing   Problem: Safety: Goal: Ability to remain free from injury will improve Outcome: Progressing   Problem: Skin Integrity: Goal: Risk for impaired skin integrity will decrease Outcome: Progressing   Problem: Education: Goal: Ability to verbalize activity precautions or restrictions will improve Outcome: Progressing Goal: Knowledge of the prescribed therapeutic regimen will improve Outcome: Progressing Goal: Understanding of discharge needs will improve Outcome: Progressing   Problem: Activity: Goal: Ability to avoid complications of mobility impairment will improve Outcome: Progressing Goal:  Ability to tolerate increased activity will improve Outcome: Progressing Goal: Will remain free from falls Outcome: Progressing   Problem: Bowel/Gastric: Goal: Gastrointestinal status for postoperative course will improve Outcome: Progressing   Problem: Clinical Measurements: Goal: Ability to maintain clinical measurements within normal limits will improve Outcome: Progressing Goal: Postoperative complications will be avoided or minimized Outcome: Progressing Goal: Diagnostic test results will improve Outcome: Progressing   Problem: Pain Management: Goal: Pain level will decrease Outcome: Progressing   Problem: Skin Integrity: Goal: Will show signs of wound healing Outcome: Progressing   Problem: Health Behavior/Discharge Planning: Goal: Identification of resources available to assist in meeting health care needs will improve Outcome: Progressing   Problem: Bladder/Genitourinary: Goal: Urinary functional status for postoperative course will improve Outcome: Progressing   Problem: Education: Goal: Ability to describe self-care measures that may prevent or decrease complications (Diabetes Survival Skills Education) will improve Outcome: Progressing Goal: Individualized Educational Video(s) Outcome: Progressing   Problem: Coping: Goal: Ability to adjust to condition or change in health will improve Outcome: Progressing   Problem: Fluid Volume: Goal: Ability to maintain a balanced intake and output will improve Outcome: Progressing   Problem: Health Behavior/Discharge Planning: Goal: Ability to identify and utilize available resources and services will improve Outcome: Progressing Goal: Ability to manage health-related needs will improve Outcome: Progressing   Problem: Metabolic: Goal: Ability to maintain appropriate glucose levels will improve Outcome: Progressing   Problem: Nutritional: Goal: Maintenance of adequate nutrition will improve Outcome:  Progressing Goal: Progress toward achieving an optimal weight will improve Outcome: Progressing   Problem: Skin Integrity: Goal: Risk for impaired skin integrity will decrease Outcome: Progressing   Problem: Tissue Perfusion: Goal: Adequacy of tissue perfusion will improve Outcome: Progressing   

## 2023-02-17 NOTE — Evaluation (Signed)
Occupational Therapy Evaluation Patient Details Name: Shari Hill MRN: [id number] DOB: [birth year] Today's Date: 02/17/2023   History of Present Illness Pt is a 67 y.o. female s/p PLIF L3-5 extension and removal of L5-S1 PLIF. PMH significant for lumbosacral spinal surgery, asthma, DM, HLD, HTN, nodule L lung, Rosai Dorfman disease, L rotator cuff repair, partial knee replacement.   Clinical Impression   PTA, pt lived with her husband and was mod I for ADL. Upon eval, pt performing UB ADL with set-up and LB ADL with Min A. Pt educated and demonstrating use of compensatory techniques for bed mobility, LB ADL, grooming, toileting, and shower transfers within precautions. Pt reluctant to use RW as she did not before, but encouraged due to mild instability during ambulation. Recommending discharge home with no OT follow up at this time. Will continue to follow to optimize safety during ADL.       Recommendations for follow up therapy are one component of a multi-disciplinary discharge planning process, led by the attending physician.  Recommendations may be updated based on patient status, additional functional criteria and insurance authorization.   Assistance Recommended at Discharge Intermittent Supervision/Assistance  Patient can return home with the following A little help with walking and/or transfers;A little help with bathing/dressing/bathroom;Help with stairs or ramp for entrance;Assist for transportation;Assistance with cooking/housework    Functional Status Assessment  Patient has had a recent decline in their functional status and demonstrates the ability to make significant improvements in function in a reasonable and predictable amount of time.  Equipment Recommendations  None recommended by OT    Recommendations for Other Services       Precautions / Restrictions Precautions Precautions: Back Precaution Booklet Issued: Yes (comment) Precaution Comments: All precautions  reviewed within the context of ADL Required Braces or Orthoses: Spinal Brace Spinal Brace: Lumbar corset (No brace needed orders, but LSO in room) Restrictions Weight Bearing Restrictions: No      Mobility Bed Mobility Overal bed mobility: Needs Assistance Bed Mobility: Rolling, Sidelying to Sit, Sit to Sidelying Rolling: Min guard Sidelying to sit: Min guard     Sit to sidelying: Mod assist General bed mobility comments: Mod A to bring BLE into bed    Transfers Overall transfer level: Needs assistance Equipment used: None, Rolling walker (2 wheels) Transfers: Sit to/from Stand Sit to Stand: Min guard           General transfer comment: Up from EOB and toilet min guard A.      Balance Overall balance assessment: Needs assistance Sitting-balance support: No upper extremity supported, Feet supported Sitting balance-Leahy Scale: Fair     Standing balance support: Bilateral upper extremity supported, No upper extremity supported, During functional activity Standing balance-Leahy Scale: Fair Standing balance comment: benefits from RW                           ADL either performed or assessed with clinical judgement   ADL Overall ADL's : Needs assistance/impaired Eating/Feeding: Modified independent;Bed level   Grooming: Supervision/safety;Standing;Wash/dry hands Grooming Details (indicate cue type and reason): reviewed compensatory techniques for oral care as well Upper Body Bathing: Set up;Sitting   Lower Body Bathing: Minimal assistance;Sit to/from stand   Upper Body Dressing : Set up;Sitting Upper Body Dressing Details (indicate cue type and reason): brace application with increased time Lower Body Dressing: Minimal assistance;Sit to/from stand Lower Body Dressing Details (indicate cue type and reason): Min A this session, but pt reporting she  has stool she normally puts her foot on; otherwise husband can assist if she experiences difficulty Toilet  Transfer: Min guard;Ambulation;Rolling walker (2 wheels) Toilet Transfer Details (indicate cue type and reason): with and without RW Toileting- Clothing Manipulation and Hygiene: Sit to/from stand;Min guard   Tub/ Shower Transfer: Walk-in shower;Min guard;Ambulation   Functional mobility during ADLs: Min guard General ADL Comments: Pt deferring use of RW due to OT being with her and reporting she will use if husband is not by her side when ambulating     Vision Ability to See in Adequate Light: 0 Adequate Patient Visual Report: No change from baseline Vision Assessment?: No apparent visual deficits     Perception Perception Perception Tested?: No   Praxis Praxis Praxis tested?: Not tested    Pertinent Vitals/Pain Pain Assessment Pain Assessment: 0-10 Pain Score: 6  Pain Location: throat, LLE Pain Descriptors / Indicators: Aching, Sore, Discomfort, Guarding, Grimacing Pain Intervention(s): Limited activity within patient's tolerance, Monitored during session     Hand Dominance     Extremity/Trunk Assessment Upper Extremity Assessment Upper Extremity Assessment: Generalized weakness   Lower Extremity Assessment Lower Extremity Assessment: Defer to PT evaluation   Cervical / Trunk Assessment Cervical / Trunk Assessment: Back Surgery (x2 (first one Jan 5))   Communication Communication Communication: No difficulties   Cognition Arousal/Alertness: Awake/alert Behavior During Therapy: WFL for tasks assessed/performed Overall Cognitive Status: Within Functional Limits for tasks assessed                                 General Comments: Able to recall precautions with min cues on arrival     General Comments  VSS    Exercises     Shoulder Instructions      Home Living Family/patient expects to be discharged to:: Private residence Living Arrangements: Spouse/significant other Available Help at Discharge: Family Type of Home: House Home Access:  Stairs to enter Secretary/administrator of Steps: 3 Entrance Stairs-Rails: Left Home Layout: One level     Bathroom Shower/Tub: Walk-in shower         Home Equipment: Agricultural consultant (2 wheels);Shower seat   Additional Comments: sleep number adjustable bed      Prior Functioning/Environment Prior Level of Function : Independent/Modified Independent             Mobility Comments: pt reports she was not using a RW before. ADLs Comments: Pt reports she was indep in ADL and IADL        OT Problem List: Decreased strength;Decreased activity tolerance;Impaired balance (sitting and/or standing);Decreased knowledge of precautions      OT Treatment/Interventions: Self-care/ADL training;Therapeutic exercise;DME and/or AE instruction;Patient/family education;Balance training;Therapeutic activities    OT Goals(Current goals can be found in the care plan section) Acute Rehab OT Goals Patient Stated Goal: go home OT Goal Formulation: With patient Time For Goal Achievement: 03/03/23 Potential to Achieve Goals: Good  OT Frequency: Min 2X/week    Co-evaluation              AM-PAC OT "6 Clicks" Daily Activity     Outcome Measure Help from another person eating meals?: None Help from another person taking care of personal grooming?: A Little Help from another person toileting, which includes using toliet, bedpan, or urinal?: A Little Help from another person bathing (including washing, rinsing, drying)?: A Little Help from another person to put on and taking off regular upper body clothing?: A Little  Help from another person to put on and taking off regular lower body clothing?: A Little 6 Click Score: 19   End of Session Equipment Utilized During Treatment: Gait belt;Rolling walker (2 wheels);Back brace Nurse Communication: Mobility status;Patient requests pain meds  Activity Tolerance: Patient tolerated treatment well Patient left: in bed;with call bell/phone within  reach  OT Visit Diagnosis: Unsteadiness on feet (R26.81);Muscle weakness (generalized) (M62.81)                Time: 8469-6295 OT Time Calculation (min): 22 min Charges:  OT General Charges $OT Visit: 1 Visit OT Evaluation $OT Eval Low Complexity: 1 Low  Tyler Deis, OTR/L [facility] Acute Rehabilitation Office: [phone]   Myrla Halsted 02/17/2023, 8:35 AM

## 2023-02-18 LAB — GLUCOSE, CAPILLARY: Glucose-Capillary: 120 mg/dL — ABNORMAL HIGH (ref 70–99)

## 2023-02-18 MED ORDER — DEXAMETHASONE SODIUM PHOSPHATE 4 MG/ML IJ SOLN
4.0000 mg | Freq: Once | INTRAMUSCULAR | Status: AC
  Administered 2023-02-18: 4 mg via INTRAVENOUS

## 2023-02-18 MED ORDER — METHOCARBAMOL 750 MG PO TABS: 750.0000 mg | ORAL_TABLET | Freq: Four times a day (QID) | ORAL | 0 refills | Status: DC

## 2023-02-18 MED ORDER — DEXAMETHASONE SODIUM PHOSPHATE 4 MG/ML IJ SOLN: 4.0000 mg | Freq: Once | INTRAMUSCULAR | Status: DC

## 2023-02-18 MED ORDER — METHYLPREDNISOLONE 4 MG PO TBPK: ORAL_TABLET | ORAL | 0 refills | Status: DC

## 2023-02-18 MED ORDER — DEXAMETHASONE SODIUM PHOSPHATE 10 MG/ML IJ SOLN
INTRAMUSCULAR | Status: AC
  Filled 2023-02-18: qty 1

## 2023-02-18 MED ORDER — HYDROCODONE-ACETAMINOPHEN 5-325 MG PO TABS: 1.0000 | ORAL_TABLET | ORAL | 0 refills | Status: DC | PRN

## 2023-02-18 NOTE — Plan of Care (Signed)
Problem: Education: Goal: Knowledge of General Education information will improve Description: Including pain rating scale, medication(s)/side effects and non-pharmacologic comfort measures 02/18/2023 0748 by Yvone Neu, RN Outcome: Progressing 02/17/2023 2029 by Yvone Neu, RN Outcome: Progressing   Problem: Health Behavior/Discharge Planning: Goal: Ability to manage health-related needs will improve 02/18/2023 0748 by Yvone Neu, RN Outcome: Progressing 02/17/2023 2029 by Yvone Neu, RN Outcome: Progressing   Problem: Clinical Measurements: Goal: Ability to maintain clinical measurements within normal limits will improve 02/18/2023 0748 by Yvone Neu, RN Outcome: Progressing 02/17/2023 2029 by Yvone Neu, RN Outcome: Progressing Goal: Will remain free from infection 02/18/2023 0748 by Yvone Neu, RN Outcome: Progressing 02/17/2023 2029 by Yvone Neu, RN Outcome: Progressing Goal: Diagnostic test results will improve 02/18/2023 0748 by Yvone Neu, RN Outcome: Progressing 02/17/2023 2029 by Yvone Neu, RN Outcome: Progressing Goal: Respiratory complications will improve 02/18/2023 0748 by Yvone Neu, RN Outcome: Progressing 02/17/2023 2029 by Yvone Neu, RN Outcome: Progressing Goal: Cardiovascular complication will be avoided 02/18/2023 0748 by Yvone Neu, RN Outcome: Progressing 02/17/2023 2029 by Yvone Neu, RN Outcome: Progressing   Problem: Activity: Goal: Risk for activity intolerance will decrease 02/18/2023 0748 by Yvone Neu, RN Outcome: Progressing 02/17/2023 2029 by Yvone Neu, RN Outcome: Progressing   Problem: Nutrition: Goal: Adequate nutrition will be maintained 02/18/2023 0748 by Yvone Neu, RN Outcome: Progressing 02/17/2023 2029 by Yvone Neu, RN Outcome: Progressing   Problem: Coping: Goal: Level of anxiety will decrease 02/18/2023 0748 by Yvone Neu, RN Outcome: Progressing 02/17/2023 2029 by Yvone Neu, RN Outcome: Progressing   Problem: Elimination: Goal: Will not experience complications related to bowel motility 02/18/2023 0748 by Yvone Neu, RN Outcome: Progressing 02/17/2023 2029 by Yvone Neu, RN Outcome: Progressing Goal: Will not experience complications related to urinary retention 02/18/2023 0748 by Yvone Neu, RN Outcome: Progressing 02/17/2023 2029 by Yvone Neu, RN Outcome: Progressing   Problem: Pain Managment: Goal: General experience of comfort will improve 02/18/2023 0748 by Yvone Neu, RN Outcome: Progressing 02/17/2023 2029 by Yvone Neu, RN Outcome: Progressing   Problem: Safety: Goal: Ability to remain free from injury will improve 02/18/2023 0748 by Yvone Neu, RN Outcome: Progressing 02/17/2023 2029 by Yvone Neu, RN Outcome: Progressing   Problem: Skin Integrity: Goal: Risk for impaired skin integrity will decrease 02/18/2023 0748 by Yvone Neu, RN Outcome: Progressing 02/17/2023 2029 by Yvone Neu, RN Outcome: Progressing   Problem: Education: Goal: Ability to verbalize activity precautions or restrictions will improve 02/18/2023 0748 by Yvone Neu, RN Outcome: Progressing 02/17/2023 2029 by Yvone Neu, RN Outcome: Progressing Goal: Knowledge of the prescribed therapeutic regimen will improve 02/18/2023 0748 by Yvone Neu, RN Outcome: Progressing 02/17/2023 2029 by Yvone Neu, RN Outcome: Progressing Goal: Understanding of discharge needs will improve 02/18/2023 0748 by Yvone Neu, RN Outcome: Progressing 02/17/2023 2029 by Yvone Neu, RN Outcome: Progressing   Problem: Activity: Goal: Ability to avoid complications of mobility impairment will improve 02/18/2023 0748 by Yvone Neu, RN Outcome: Progressing 02/17/2023 2029 by Yvone Neu, RN Outcome: Progressing Goal: Ability to  tolerate increased activity will improve 02/18/2023 0748 by Yvone Neu, RN Outcome: Progressing 02/17/2023 2029 by Yvone Neu, RN Outcome: Progressing Goal: Will remain free from falls 02/18/2023 0748 by Yvone Neu, RN Outcome: Progressing 02/17/2023 2029 by Yvone Neu,  RN Outcome: Progressing   Problem: Bowel/Gastric: Goal: Gastrointestinal status for postoperative course will improve 02/18/2023 0748 by Yvone Neu, RN Outcome: Progressing 02/17/2023 2029 by Yvone Neu, RN Outcome: Progressing   Problem: Clinical Measurements: Goal: Ability to maintain clinical measurements within normal limits will improve 02/18/2023 0748 by Yvone Neu, RN Outcome: Progressing 02/17/2023 2029 by Yvone Neu, RN Outcome: Progressing Goal: Postoperative complications will be avoided or minimized 02/18/2023 0748 by Yvone Neu, RN Outcome: Progressing 02/17/2023 2029 by Yvone Neu, RN Outcome: Progressing Goal: Diagnostic test results will improve 02/18/2023 0748 by Yvone Neu, RN Outcome: Progressing 02/17/2023 2029 by Yvone Neu, RN Outcome: Progressing   Problem: Pain Management: Goal: Pain level will decrease 02/18/2023 0748 by Yvone Neu, RN Outcome: Progressing 02/17/2023 2029 by Yvone Neu, RN Outcome: Progressing   Problem: Skin Integrity: Goal: Will show signs of wound healing 02/18/2023 0748 by Yvone Neu, RN Outcome: Progressing 02/17/2023 2029 by Yvone Neu, RN Outcome: Progressing   Problem: Health Behavior/Discharge Planning: Goal: Identification of resources available to assist in meeting health care needs will improve 02/18/2023 0748 by Yvone Neu, RN Outcome: Progressing 02/17/2023 2029 by Yvone Neu, RN Outcome: Progressing   Problem: Bladder/Genitourinary: Goal: Urinary functional status for postoperative course will improve 02/18/2023 0748 by Yvone Neu, RN Outcome:  Progressing 02/17/2023 2029 by Yvone Neu, RN Outcome: Progressing   Problem: Education: Goal: Ability to describe self-care measures that may prevent or decrease complications (Diabetes Survival Skills Education) will improve 02/18/2023 0748 by Yvone Neu, RN Outcome: Progressing 02/17/2023 2029 by Yvone Neu, RN Outcome: Progressing Goal: Individualized Educational Video(s) 02/18/2023 0748 by Yvone Neu, RN Outcome: Progressing 02/17/2023 2029 by Yvone Neu, RN Outcome: Progressing   Problem: Coping: Goal: Ability to adjust to condition or change in health will improve 02/18/2023 0748 by Yvone Neu, RN Outcome: Progressing 02/17/2023 2029 by Yvone Neu, RN Outcome: Progressing   Problem: Fluid Volume: Goal: Ability to maintain a balanced intake and output will improve 02/18/2023 0748 by Yvone Neu, RN Outcome: Progressing 02/17/2023 2029 by Yvone Neu, RN Outcome: Progressing   Problem: Health Behavior/Discharge Planning: Goal: Ability to identify and utilize available resources and services will improve 02/18/2023 0748 by Yvone Neu, RN Outcome: Progressing 02/17/2023 2029 by Yvone Neu, RN Outcome: Progressing Goal: Ability to manage health-related needs will improve 02/18/2023 0748 by Yvone Neu, RN Outcome: Progressing 02/17/2023 2029 by Yvone Neu, RN Outcome: Progressing   Problem: Metabolic: Goal: Ability to maintain appropriate glucose levels will improve 02/18/2023 0748 by Yvone Neu, RN Outcome: Progressing 02/17/2023 2029 by Yvone Neu, RN Outcome: Progressing   Problem: Nutritional: Goal: Maintenance of adequate nutrition will improve 02/18/2023 0748 by Yvone Neu, RN Outcome: Progressing 02/17/2023 2029 by Yvone Neu, RN Outcome: Progressing Goal: Progress toward achieving an optimal weight will improve 02/18/2023 0748 by Yvone Neu, RN Outcome:  Progressing 02/17/2023 2029 by Yvone Neu, RN Outcome: Progressing   Problem: Skin Integrity: Goal: Risk for impaired skin integrity will decrease 02/18/2023 0748 by Yvone Neu, RN Outcome: Progressing 02/17/2023 2029 by Yvone Neu, RN Outcome: Progressing   Problem: Tissue Perfusion: Goal: Adequacy of tissue perfusion will improve 02/18/2023 0748 by Yvone Neu, RN Outcome: Progressing 02/17/2023 2029 by Yvone Neu, RN Outcome: Progressing

## 2023-02-18 NOTE — Progress Notes (Signed)
Patient alert and oriented, mae's well, voiding adequate amount of urine, swallowing without difficulty, no c/o pain at time of discharge. Patient discharged home with family. Script and discharged instructions given to patient. Patient and family stated understanding of instructions given. Patient has an appointment with Dr. Cram in 2 weeks 

## 2023-02-18 NOTE — Progress Notes (Signed)
OT Cancellation Note  Patient Details Name: Shari Hill MRN: [id number] DOB: 1955/11/08   Cancelled Treatment:    Reason Eval/Treat Not Completed: Other (comment). Pt lying comfortably in bed on arrival. Able to recall all information reviewed yesterday, and both pt and RN report bed mobility and ADL have been going ell. Husband also available to assist at home. Pt did not want to get up at this time, but RN aware to message OT with any difficulty with ADL prior to dc.   Tyler Deis, OTR/L [facility] Acute Rehabilitation Office: [phone]   Shari Hill 02/18/2023, 9:51 AM

## 2023-02-18 NOTE — Discharge Summary (Signed)
Physician Discharge Summary  Patient ID: Shari Hill MRN: [id number] DOB/AGE: February 13, 1956 67 y.o. Estimated body mass index is 41.45 kg/m as calculated from the following:   Height as of this encounter: 5\' 3"  (1.6 m).   Weight as of this encounter: 106.1 kg.   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  Discharge Diagnoses: Same Principal Problem:   Spinal stenosis of lumbar region   Discharged Condition: good  Hospital Course: Patient was admitted to hospital underwent decompressive laminectomies interbody fusion L3-4 L4-5 postoperatively patient did fairly well with covering the floor on the floor was ambulating and voiding spontaneously tolerating a diet and having some left-sided leg pain lateral thigh anterior quad but she was really tender around her left greater trochanteric bursa so I think this might be bursitis we gave her a shot of steroids and will discharge her with a Medrol Dosepak with scheduled follow-up in 2 weeks.  Consults: Significant Diagnostic Studies: Treatments: Decompressive laminectomy interbody fusion L3-4 L4-5 Discharge Exam: Blood pressure 130/72, pulse 83, temperature 99.6 F (37.6 C), temperature source Oral, resp. rate 20, height 5\' 3"  (1.6 m), weight 106.1 kg, SpO2 96 %. Strength 5 out of 5 wound clean dry and intact  Disposition: Home  Discharge Instructions      Remove dressing in 72 hours   Complete by: As directed    Call MD for:  difficulty breathing, headache or visual disturbances   Complete by: As directed    Call MD for:  hives   Complete by: As directed    Call MD for:  persistant dizziness or light-headedness   Complete by: As directed    Call MD for:  persistant nausea and vomiting   Complete by: As directed    Call MD for:  redness, tenderness, or signs of infection (pain, swelling, redness, odor or green/yellow discharge around incision site)    Complete by: As directed    Call MD for:  severe uncontrolled pain   Complete by: As directed    Call MD for:  temperature >100.4   Complete by: As directed    Diet - low sodium heart healthy   Complete by: As directed    Driving Restrictions   Complete by: As directed    No driving for 2 weeks, no riding in the car for 1 week   Increase activity slowly   Complete by: As directed    Lifting restrictions   Complete by: As directed    No lifting more than 8 lbs      Allergies as of 02/18/2023       Reactions   Zocor [simvastatin] Other (See Comments)   Unknown reaction per pt   Latex Hives, Rash   Other Hives, Itching, Swelling, Rash   Dissolvable stiches         Medication List     TAKE these medications    acetaminophen 500 MG tablet Commonly known as: TYLENOL Take 500-1,000 mg by mouth every 6 (six) hours as needed (pain.).   acetaminophen-codeine 300-15 MG tablet Commonly known as: TYLENOL #2 Take 1 tablet by mouth 3 (three) times daily as needed for moderate pain.   albuterol 108 (90 Base) MCG/ACT inhaler Commonly known as: VENTOLIN HFA Inhale into the lungs every 6 (six) hours as needed for wheezing or shortness of breath.   amLODipine 10 MG tablet Commonly known as: NORVASC Take 10 mg by mouth every evening.   atorvastatin 20  MG tablet Commonly known as: LIPITOR Take 20 mg by mouth every evening.   azelastine 0.05 % ophthalmic solution Commonly known as: OPTIVAR Place 1 drop into both eyes 2 (two) times daily as needed (allergies).   Azelastine HCl 137 MCG/SPRAY Soln Place 1 spray into both nostrils daily as needed (allergies).   budesonide-formoterol 160-4.5 MCG/ACT inhaler Commonly known as: SYMBICORT Inhale 2 puffs into the lungs 2 (two) times daily as needed (respiratory issues.).   empagliflozin 10 MG Tabs tablet Commonly known as: JARDIANCE Take 10 mg by mouth in the morning.   EPINEPHrine 0.3 mg/0.3 mL Soaj injection Commonly known  as: EPI-PEN Inject 0.3 mg into the muscle as needed for anaphylaxis.   folic acid 1 MG tablet Commonly known as: FOLVITE Take 1 tablet (1 mg total) by mouth daily. Do not take on day you take methotrexate What changed:  when to take this additional instructions   GAS-X PO Take 1 tablet by mouth daily as needed (gas).   glipiZIDE 5 MG 24 hr tablet Commonly known as: GLUCOTROL XL Take 5 mg by mouth in the morning.   hydrochlorothiazide 25 MG tablet Commonly known as: HYDRODIURIL Take 25 mg by mouth in the morning.   HYDROcodone-acetaminophen 5-325 MG tablet Commonly known as: NORCO/VICODIN Take 1-2 tablets by mouth every 4 (four) hours as needed for moderate pain.   Janumet XR [phone] MG Tb24 Generic drug: SitaGLIPtin-MetFORMIN HCl Take 1 tablet by mouth every evening.   levocetirizine 5 MG tablet Commonly known as: XYZAL Take 5 mg by mouth every evening.   lisinopril 40 MG tablet Commonly known as: ZESTRIL Take 40 mg by mouth in the morning.   methocarbamol 750 MG tablet Commonly known as: Robaxin-750 Take 1 tablet (750 mg total) by mouth 4 (four) times daily.   methotrexate 10 MG tablet Commonly known as: RHEUMATREX Take 1 tablet (10 mg total) by mouth once a week. Caution: Chemotherapy. Protect from light.   methylPREDNISolone 4 MG Tbpk tablet Commonly known as: MEDROL DOSEPAK Take as directed   metoprolol succinate 50 MG 24 hr tablet Commonly known as: TOPROL-XL Take 50 mg by mouth every evening.   TYLENOL PRECISE PAIN RELIEVING EX Apply 1 Application topically daily as needed (pain).         Signed: Mariam Dollar 02/18/2023, 9:37 AM

## 2023-02-25 ENCOUNTER — Ambulatory Visit: Payer: Medicare Other | Admitting: Oncology

## 2023-02-25 ENCOUNTER — Other Ambulatory Visit: Payer: Medicare Other

## 2023-02-25 MED FILL — Heparin Sodium (Porcine) Inj 1000 Unit/ML: INTRAMUSCULAR | Qty: 60 | Status: AC

## 2023-02-25 MED FILL — Sodium Chloride IV Soln 0.9%: INTRAVENOUS | Qty: 2000 | Status: AC

## 2023-02-28 ENCOUNTER — Encounter: Payer: Self-pay | Admitting: Oncology

## 2023-03-04 ENCOUNTER — Other Ambulatory Visit: Payer: Self-pay | Admitting: Neurosurgery

## 2023-03-04 ENCOUNTER — Telehealth: Payer: Self-pay | Admitting: Oncology

## 2023-03-04 ENCOUNTER — Inpatient Hospital Stay: Payer: Medicare Other | Admitting: Oncology

## 2023-03-04 ENCOUNTER — Inpatient Hospital Stay: Payer: Medicare Other | Attending: Oncology

## 2023-03-04 VITALS — BP 122/80 | HR 80 | Temp 98.1°F | Resp 18 | Ht 63.0 in | Wt 223.6 lb

## 2023-03-04 DIAGNOSIS — D763 Other histiocytosis syndromes: Secondary | ICD-10-CM | POA: Insufficient documentation

## 2023-03-04 DIAGNOSIS — L988 Other specified disorders of the skin and subcutaneous tissue: Secondary | ICD-10-CM | POA: Diagnosis not present

## 2023-03-04 DIAGNOSIS — R911 Solitary pulmonary nodule: Secondary | ICD-10-CM | POA: Diagnosis not present

## 2023-03-04 DIAGNOSIS — M4316 Spondylolisthesis, lumbar region: Secondary | ICD-10-CM

## 2023-03-04 DIAGNOSIS — E785 Hyperlipidemia, unspecified: Secondary | ICD-10-CM | POA: Diagnosis not present

## 2023-03-04 DIAGNOSIS — I10 Essential (primary) hypertension: Secondary | ICD-10-CM | POA: Insufficient documentation

## 2023-03-04 DIAGNOSIS — E119 Type 2 diabetes mellitus without complications: Secondary | ICD-10-CM | POA: Diagnosis not present

## 2023-03-04 DIAGNOSIS — J45909 Unspecified asthma, uncomplicated: Secondary | ICD-10-CM | POA: Diagnosis not present

## 2023-03-04 LAB — CMP (CANCER CENTER ONLY)
ALT: 9 U/L (ref 0–44)
AST: 9 U/L — ABNORMAL LOW (ref 15–41)
Albumin: 4.3 g/dL (ref 3.5–5.0)
Alkaline Phosphatase: 107 U/L (ref 38–126)
Anion gap: 12 (ref 5–15)
BUN: 29 mg/dL — ABNORMAL HIGH (ref 8–23)
CO2: 28 mmol/L (ref 22–32)
Calcium: 9.9 mg/dL (ref 8.9–10.3)
Chloride: 100 mmol/L (ref 98–111)
Creatinine: 1.39 mg/dL — ABNORMAL HIGH (ref 0.44–1.00)
GFR, Estimated: 41 mL/min — ABNORMAL LOW (ref >60)
Glucose, Bld: 268 mg/dL — ABNORMAL HIGH (ref 70–99)
Potassium: 4 mmol/L (ref 3.5–5.1)
Sodium: 140 mmol/L (ref 135–145)
Total Bilirubin: 0.3 mg/dL (ref 0.3–1.2)
Total Protein: 7.3 g/dL (ref 6.5–8.1)

## 2023-03-04 LAB — CBC WITH DIFFERENTIAL (CANCER CENTER ONLY)
Abs Immature Granulocytes: 0.14 10*3/uL — ABNORMAL HIGH (ref 0.00–0.07)
Basophils Absolute: 0.1 10*3/uL (ref 0.0–0.1)
Basophils Relative: 0 %
Eosinophils Absolute: 0 10*3/uL (ref 0.0–0.5)
Eosinophils Relative: 0 %
HCT: 39.2 % (ref 36.0–46.0)
Hemoglobin: 12.7 g/dL (ref 12.0–15.0)
Immature Granulocytes: 1 %
Lymphocytes Relative: 16 %
Lymphs Abs: 2.6 10*3/uL (ref 0.7–4.0)
MCH: 27.1 pg (ref 26.0–34.0)
MCHC: 32.4 g/dL (ref 30.0–36.0)
MCV: 83.8 fL (ref 80.0–100.0)
Monocytes Absolute: 0.7 10*3/uL (ref 0.1–1.0)
Monocytes Relative: 4 %
Neutro Abs: 12.6 10*3/uL — ABNORMAL HIGH (ref 1.7–7.7)
Neutrophils Relative %: 79 %
Platelet Count: 328 10*3/uL (ref 150–400)
RBC: 4.68 MIL/uL (ref 3.87–5.11)
RDW: 18.5 % — ABNORMAL HIGH (ref 11.5–15.5)
WBC Count: 16.1 10*3/uL — ABNORMAL HIGH (ref 4.0–10.5)
nRBC: 0 % (ref 0.0–0.2)

## 2023-03-04 NOTE — Telephone Encounter (Signed)
Scheduled appointments per 7/12 los. Patient is aware of the made appointments.

## 2023-03-04 NOTE — Progress Notes (Signed)
Shari Hill   Diagnosis: Shari Hill  INTERVAL HISTORY:   Shari Hill returns as scheduled.  She began weekly methotrexate on 02/07/2023.  The dose was held on 02/14/2023 due to pain surgery on 02/16/2023.  She resumed weekly methotrexate on 02/21/2023.  No mouth sores, rash, or diarrhea. She underwent repeat lumbar spine surgery 02/16/2023.  She reports developing pain at the left buttock, left leg, and left foot beginning 02/17/2023.  She is completing a steroid Dosepak.  The lesion at the left buttock is softer.  The lesion is sloughing skin.  Objective:  Vital signs in last 24 hours:  Blood pressure 122/80, pulse 80, temperature 98.1 F (36.7 C), temperature source Temporal, resp. rate 18, height 5\' 3"  (1.6 m), weight 223 lb 9.6 oz (101.4 kg), SpO2 99%.    HEENT: White coat over the tongue, no buccal thrush.  No ulcers Resp: Lungs clear bilaterally Cardio: Regular rate and rhythm Vascular: No leg edema  Skin: Healed lumbar incision.  Raised hyperpigmented approximate 2-2.5 cm at the left anterior thigh, 1 cm hyperpigmented soft lesion at the high right pretibial area, 5-6 cm round raised mobile lesion at the left buttock, there there is superficial flaking of skin over this lesion  Lab Results:  Lab Results  Component Value Date   WBC 16.1 (H) 03/04/2023   HGB 12.7 03/04/2023   HCT 39.2 03/04/2023   MCV 83.8 03/04/2023   PLT 328 03/04/2023   NEUTROABS 12.6 (H) 03/04/2023    CMP  Lab Results  Component Value Date   NA 140 03/04/2023   K 4.0 03/04/2023   CL 100 03/04/2023   CO2 28 03/04/2023   GLUCOSE 268 (H) 03/04/2023   BUN 29 (H) 03/04/2023   CREATININE 1.39 (H) 03/04/2023   CALCIUM 9.9 03/04/2023   PROT 7.3 03/04/2023   ALBUMIN 4.3 03/04/2023   AST 9 (L) 03/04/2023   ALT 9 03/04/2023   ALKPHOS 107 03/04/2023   BILITOT 0.3 03/04/2023   GFRNONAA 41 (L) 03/04/2023    Medications: I have reviewed the patient's current  medications.   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 PET October 2013-numerous FDG avid subdermal/subcutaneous nodules and bone lesions Prednisone October 2013 with initial response, tapered until February 2014 when skin lesions enlarged Rituximab 4 weekly doses beginning, followed by rituximab maintenance with completion of 2 years January 2016 Hill progression July 2019-no response to rituximab or Gleevec Revlimid/Decadron August 2019-partial response 01/28/2022-enlarging cutaneous lesion left anterior thigh, possible small similar lesions at the left abdomen and left lower leg PET 03/11/2022-heterogenous mildly enlarged right thyroid with calcification, cutaneous left anterior thigh lesion with moderate metabolic activity, cutaneous lesion at the mid anterior chest-nonspecific, left upper lobe pulmonary nodule with low-level FDG activity less than mediastinal blood pool-nonspecific Thalidomide beginning 09/13/2022 Thalidomide discontinued 12/06/2022 secondary to apparent toxicities (malaise, headaches,1 weight gain (and no regression of the cutaneous lesions) PET scan 01/21/2023-intense hypermetabolic subcutaneous nodular thickening in the posterior left buttock region; similar metabolic cutaneous lesion anterior left thigh; intense metabolic activity lateral to the right lateral femoral condyle; intense radiotracer activity in the dorsum of the left foot superficial to the proximal third phalanx 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  Stable left upper lobe pulmonary nodule with very low metabolic activity. Methotrexate beginning 02/07/2023 Diabetes Hypertension Hyperlipidemia Asthma/seasonal allergies Left lung nodule on PET 03/11/2022 CT chest 07/12/2022-unchanged left upper lobe nodule, no new suspicious nodules or masses heterogenous enlargement of the thyroid  7.  Multinodular thyroid-2 right-sided nodules meet criteria for biopsy and  a calcified nodule in the right mid thyroid meets criteria for surveillance imaging 8.  L5-S1 decompressive laminotomies 08/27/2022   Disposition: Shari Hill appears to be tolerating the methotrexate well.  The left buttock lesion appears more mobile and softer on exam today.  The left anterior thigh lesion is stable.  No evidence of Hill progression.  I suspect the lesion at the right pretibial area is a varicosity.  The creatinine is slightly higher today.  She will return for a lab visit prior to taking methotrexate on 03/14/2023.  She will be scheduled for an office and lab visit on 03/28/2023.  She continues follow-up with Shari Hill following the lumbar spine surgery.  I doubt the left buttock and leg pain are related to the Shari Hill.  Shari Papas, MD  03/04/2023  2:31 PM

## 2023-03-09 ENCOUNTER — Ambulatory Visit: Admission: RE | Admit: 2023-03-09 | Payer: Medicare Other | Source: Ambulatory Visit

## 2023-03-09 DIAGNOSIS — M4316 Spondylolisthesis, lumbar region: Secondary | ICD-10-CM

## 2023-03-14 ENCOUNTER — Telehealth: Payer: Self-pay | Admitting: *Deleted

## 2023-03-14 ENCOUNTER — Inpatient Hospital Stay: Payer: Medicare Other

## 2023-03-14 DIAGNOSIS — D763 Other histiocytosis syndromes: Secondary | ICD-10-CM | POA: Diagnosis not present

## 2023-03-14 LAB — CBC WITH DIFFERENTIAL (CANCER CENTER ONLY)
Abs Immature Granulocytes: 0.04 10*3/uL (ref 0.00–0.07)
Basophils Absolute: 0 10*3/uL (ref 0.0–0.1)
Basophils Relative: 0 %
Eosinophils Absolute: 0.2 10*3/uL (ref 0.0–0.5)
Eosinophils Relative: 3 %
HCT: 38.8 % (ref 36.0–46.0)
Hemoglobin: 12.4 g/dL (ref 12.0–15.0)
Immature Granulocytes: 0 %
Lymphocytes Relative: 27 %
Lymphs Abs: 2.4 10*3/uL (ref 0.7–4.0)
MCH: 27.3 pg (ref 26.0–34.0)
MCHC: 32 g/dL (ref 30.0–36.0)
MCV: 85.3 fL (ref 80.0–100.0)
Monocytes Absolute: 0.7 10*3/uL (ref 0.1–1.0)
Monocytes Relative: 8 %
Neutro Abs: 5.6 10*3/uL (ref 1.7–7.7)
Neutrophils Relative %: 62 %
Platelet Count: 271 10*3/uL (ref 150–400)
RBC: 4.55 MIL/uL (ref 3.87–5.11)
RDW: 19.3 % — ABNORMAL HIGH (ref 11.5–15.5)
WBC Count: 8.9 10*3/uL (ref 4.0–10.5)
nRBC: 0 % (ref 0.0–0.2)

## 2023-03-14 LAB — CMP (CANCER CENTER ONLY)
ALT: 13 U/L (ref 0–44)
AST: 14 U/L — ABNORMAL LOW (ref 15–41)
Albumin: 4.4 g/dL (ref 3.5–5.0)
Alkaline Phosphatase: 107 U/L (ref 38–126)
Anion gap: 14 (ref 5–15)
BUN: 21 mg/dL (ref 8–23)
CO2: 26 mmol/L (ref 22–32)
Calcium: 10 mg/dL (ref 8.9–10.3)
Chloride: 102 mmol/L (ref 98–111)
Creatinine: 1.34 mg/dL — ABNORMAL HIGH (ref 0.44–1.00)
GFR, Estimated: 43 mL/min — ABNORMAL LOW (ref >60)
Glucose, Bld: 231 mg/dL — ABNORMAL HIGH (ref 70–99)
Potassium: 4.1 mmol/L (ref 3.5–5.1)
Sodium: 142 mmol/L (ref 135–145)
Total Bilirubin: 0.3 mg/dL (ref 0.3–1.2)
Total Protein: 7.6 g/dL (ref 6.5–8.1)

## 2023-03-14 MED ORDER — METHOTREXATE SODIUM 10 MG PO TABS: 10.0000 mg | ORAL_TABLET | ORAL | 0 refills | Status: DC

## 2023-03-14 NOTE — Telephone Encounter (Signed)
Notified patient her labs are stable. Refill on methotrexate was sent.

## 2023-03-16 ENCOUNTER — Other Ambulatory Visit (HOSPITAL_COMMUNITY): Payer: Self-pay | Admitting: Neurosurgery

## 2023-03-16 DIAGNOSIS — R600 Localized edema: Secondary | ICD-10-CM

## 2023-03-17 ENCOUNTER — Ambulatory Visit (HOSPITAL_COMMUNITY)
Admission: RE | Admit: 2023-03-17 | Discharge: 2023-03-17 | Disposition: A | Discharge status: Home / Self Care | Payer: Medicare Other | Source: Ambulatory Visit | Attending: Vascular Surgery | Admitting: Vascular Surgery

## 2023-03-17 DIAGNOSIS — R600 Localized edema: Secondary | ICD-10-CM | POA: Diagnosis not present

## 2023-03-22 ENCOUNTER — Encounter: Payer: Self-pay | Admitting: Oncology

## 2023-03-28 ENCOUNTER — Inpatient Hospital Stay: Payer: Medicare Other | Attending: Oncology

## 2023-03-28 ENCOUNTER — Inpatient Hospital Stay (HOSPITAL_BASED_OUTPATIENT_CLINIC_OR_DEPARTMENT_OTHER): Payer: Medicare Other | Admitting: Oncology

## 2023-03-28 VITALS — BP 117/85 | HR 77 | Temp 98.2°F | Resp 18 | Ht 63.0 in | Wt 221.0 lb

## 2023-03-28 DIAGNOSIS — E119 Type 2 diabetes mellitus without complications: Secondary | ICD-10-CM | POA: Insufficient documentation

## 2023-03-28 DIAGNOSIS — M549 Dorsalgia, unspecified: Secondary | ICD-10-CM | POA: Insufficient documentation

## 2023-03-28 DIAGNOSIS — Z79631 Long term (current) use of antimetabolite agent: Secondary | ICD-10-CM | POA: Diagnosis not present

## 2023-03-28 DIAGNOSIS — E785 Hyperlipidemia, unspecified: Secondary | ICD-10-CM | POA: Diagnosis not present

## 2023-03-28 DIAGNOSIS — Z5112 Encounter for antineoplastic immunotherapy: Secondary | ICD-10-CM | POA: Diagnosis not present

## 2023-03-28 DIAGNOSIS — J45909 Unspecified asthma, uncomplicated: Secondary | ICD-10-CM | POA: Insufficient documentation

## 2023-03-28 DIAGNOSIS — I10 Essential (primary) hypertension: Secondary | ICD-10-CM | POA: Insufficient documentation

## 2023-03-28 DIAGNOSIS — R918 Other nonspecific abnormal finding of lung field: Secondary | ICD-10-CM | POA: Insufficient documentation

## 2023-03-28 DIAGNOSIS — D763 Other histiocytosis syndromes: Secondary | ICD-10-CM | POA: Diagnosis not present

## 2023-03-28 DIAGNOSIS — E042 Nontoxic multinodular goiter: Secondary | ICD-10-CM | POA: Diagnosis not present

## 2023-03-28 LAB — CMP (CANCER CENTER ONLY)
ALT: 14 U/L (ref 0–44)
AST: 11 U/L — ABNORMAL LOW (ref 15–41)
Albumin: 4.3 g/dL (ref 3.5–5.0)
Alkaline Phosphatase: 99 U/L (ref 38–126)
Anion gap: 10 (ref 5–15)
BUN: 26 mg/dL — ABNORMAL HIGH (ref 8–23)
CO2: 29 mmol/L (ref 22–32)
Calcium: 10.2 mg/dL (ref 8.9–10.3)
Chloride: 103 mmol/L (ref 98–111)
Creatinine: 1.29 mg/dL — ABNORMAL HIGH (ref 0.44–1.00)
GFR, Estimated: 45 mL/min — ABNORMAL LOW (ref >60)
Glucose, Bld: 114 mg/dL — ABNORMAL HIGH (ref 70–99)
Potassium: 4.1 mmol/L (ref 3.5–5.1)
Sodium: 142 mmol/L (ref 135–145)
Total Bilirubin: 0.2 mg/dL — ABNORMAL LOW (ref 0.3–1.2)
Total Protein: 7 g/dL (ref 6.5–8.1)

## 2023-03-28 LAB — CBC WITH DIFFERENTIAL (CANCER CENTER ONLY)
Abs Immature Granulocytes: 0.06 10*3/uL (ref 0.00–0.07)
Basophils Absolute: 0.1 10*3/uL (ref 0.0–0.1)
Basophils Relative: 0 %
Eosinophils Absolute: 0.1 10*3/uL (ref 0.0–0.5)
Eosinophils Relative: 0 %
HCT: 38.6 % (ref 36.0–46.0)
Hemoglobin: 12.7 g/dL (ref 12.0–15.0)
Immature Granulocytes: 0 %
Lymphocytes Relative: 24 %
Lymphs Abs: 3.2 10*3/uL (ref 0.7–4.0)
MCH: 28 pg (ref 26.0–34.0)
MCHC: 32.9 g/dL (ref 30.0–36.0)
MCV: 85 fL (ref 80.0–100.0)
Monocytes Absolute: 1.2 10*3/uL — ABNORMAL HIGH (ref 0.1–1.0)
Monocytes Relative: 9 %
Neutro Abs: 8.9 10*3/uL — ABNORMAL HIGH (ref 1.7–7.7)
Neutrophils Relative %: 67 %
Platelet Count: 281 10*3/uL (ref 150–400)
RBC: 4.54 MIL/uL (ref 3.87–5.11)
RDW: 20.1 % — ABNORMAL HIGH (ref 11.5–15.5)
WBC Count: 13.5 10*3/uL — ABNORMAL HIGH (ref 4.0–10.5)
nRBC: 0 % (ref 0.0–0.2)

## 2023-03-28 MED ORDER — METHOTREXATE SODIUM 10 MG PO TABS: 10.0000 mg | ORAL_TABLET | ORAL | 0 refills | Status: DC

## 2023-03-28 NOTE — Addendum Note (Signed)
Addended by: Wandalee Ferdinand on: 03/28/2023 04:38 PM   Modules accepted: Orders

## 2023-03-28 NOTE — Progress Notes (Signed)
[facility] OFF[facility]ESS [facility]   Diagnosis: Rosai-Dorfman disease  INTERVAL HISTORY:   Shari Hill returns as scheduled.  She continues weekly methotrexate.  She had intense pain at the left buttock last week on the day after taking methotrexate.  Pain has resolved.  The left buttock lesion is more noticeable.  The left anterior thigh lesion has "divided into 2 lesions ". No mouth sores, nausea, rash, or diarrhea.  Stable back pain. Objective:  Vital signs in last 24 hours:  Blood pressure 117/85, pulse 77, temperature 98.2 F (36.8 C), temperature source Oral, resp. rate 18, height 5\' 3"  (1.6 m), weight 221 lb (100.2 kg), SpO2 100%.    HEENT: No thrush or ulcers Resp: Lungs clear bilaterally Cardio: Regular rate and rhythm GI: No hepatosplenomegaly Vascular: No leg edema  Skin: Hyperpigmented raised lesion at the left anterior thigh measures approximately 1.5 cm in transverse dimension and 1 cm in vertical dimension, left buttock lesion appears softer and appears to be regressing at the edges.  The superficial component measures 4 cm in transverse dimension and 3 cm in vertical dimension, there is an additional 1 cm of subcutaneous induration   Lab Results:  Lab Results  Component Value Date   WBC 13.5 (H) 03/28/2023   HGB 12.7 03/28/2023   HCT 38.6 03/28/2023   MCV 85.0 03/28/2023   PLT 281 03/28/2023   NEUTROABS 8.9 (H) 03/28/2023    CMP  Lab Results  Component Value Date   NA 142 03/14/2023   K 4.1 03/14/2023   CL 102 03/14/2023   CO2 26 03/14/2023   GLUCOSE 231 (H) 03/14/2023   BUN 21 03/14/2023   CREATININE 1.34 (H) 03/14/2023   CALCIUM 10.0 03/14/2023   PROT 7.6 03/14/2023   ALBUMIN 4.4 03/14/2023   AST 14 (L) 03/14/2023   ALT 13 03/14/2023   ALKPHOS 107 03/14/2023   BILITOT 0.3 03/14/2023   GFRNONAA 43 (L) 03/14/2023    No results found for: "CEA1", "CEA", "UYQ034", "CA125"  No results found for: "INR", "LABPROT"  Imaging:  No  results found.  Medications: I have reviewed the patient's current medications.   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 PET October 2013-numerous FDG avid subdermal/subcutaneous nodules and bone lesions Prednisone October 2013 with initial response, tapered until February 2014 when skin lesions enlarged Rituximab 4 weekly doses beginning, followed by rituximab maintenance with completion of 2 years January 2016 Disease progression July 2019-no response to rituximab or Gleevec Revlimid/Decadron August 2019-partial response 01/28/2022-enlarging cutaneous lesion left anterior thigh, possible small similar lesions at the left abdomen and left lower leg PET 03/11/2022-heterogenous mildly enlarged right thyroid with calcification, cutaneous left anterior thigh lesion with moderate metabolic activity, cutaneous lesion at the mid anterior chest-nonspecific, left upper lobe pulmonary nodule with low-level FDG activity less than mediastinal blood pool-nonspecific Thalidomide beginning 09/13/2022 Thalidomide discontinued 12/06/2022 secondary to apparent toxicities (malaise, headaches,1 weight gain (and no regression of the cutaneous lesions) PET scan 01/21/2023-intense hypermetabolic subcutaneous nodular thickening in the posterior left buttock region; similar metabolic cutaneous lesion anterior left thigh; intense metabolic activity lateral to the right lateral femoral condyle; intense radiotracer activity in the dorsum of the left foot superficial to the proximal third phalanx 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  Stable left upper lobe pulmonary nodule with very low metabolic activity. Methotrexate beginning 02/07/2023 Diabetes Hypertension Hyperlipidemia Asthma/seasonal allergies Left lung nodule on PET 03/11/2022 CT chest 07/12/2022-unchanged left upper lobe nodule, no new suspicious nodules or  masses heterogenous enlargement of the thyroid 7.   Multinodular thyroid-2 right-sided nodules meet criteria for biopsy and a calcified nodule in the right mid thyroid meets criteria for surveillance imaging 8.  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  She has been maintained on weekly methotrexate since February 07, 2023.  She appears to be tolerating the methotrexate well.  I doubt the acute pain at the left buttock last week was related to methotrexate therapy.  The dominant lesion at the left buttock appears to be smaller.  The lesion at the left anterior thigh also appears smaller.  I recommend continue methotrexate.  She asked about surgical resection of the dominant buttock lesion.  I think this would be possible, but would require a significant area to be resected.  She will discuss this option and radiation when she sees Dr. Leotis Pain next week.  She will return for an office visit in approximately 4 weeks.  Thornton Papas, MD  03/28/2023  3:26 PM

## 2023-04-05 ENCOUNTER — Encounter: Payer: Self-pay | Admitting: Oncology

## 2023-04-11 ENCOUNTER — Inpatient Hospital Stay (HOSPITAL_BASED_OUTPATIENT_CLINIC_OR_DEPARTMENT_OTHER): Payer: Medicare Other | Admitting: Oncology

## 2023-04-11 ENCOUNTER — Encounter: Payer: Self-pay | Admitting: Oncology

## 2023-04-11 VITALS — BP 138/87 | HR 88 | Temp 98.2°F | Resp 18 | Ht 63.0 in | Wt 222.3 lb

## 2023-04-11 DIAGNOSIS — D763 Other histiocytosis syndromes: Secondary | ICD-10-CM | POA: Diagnosis not present

## 2023-04-11 NOTE — Progress Notes (Signed)
START OFF PATHWAY REGIMEN - Other   [id number]:Rituximab IV/SUBQ D1 q7 Days:   Cycle 1: A cycle is 7 days:     Rituximab-xxxx    Cycles 2 and beyond: A cycle is every 7 days:     Rituximab and hyaluronidase human   **Always confirm dose/schedule in your pharmacy ordering system**  Patient Characteristics: Intent of Therapy: Non-Curative / Palliative Intent, Discussed with Patient

## 2023-04-11 NOTE — Progress Notes (Signed)
[facility] OFFICE PROGRESS NOTE   Diagnosis: Rosai-Dorfman disease  INTERVAL HISTORY:   Shari Hill returns as scheduled.  She last took methotrexate on 04/04/2023.  No mouth sores or rash.  The lesion at the left buttock is stable.  There is a new lesion at the left upper arm. She saw Dr. Leotis Pain on 04/05/2023.  He recommends discontinuing methotrexate and beginning rituximab.  He recommends adding cobimetinib if there is not a complete response to rituximab.  Shari Hill reports tolerating rituximab well when she received this in the past.  No symptom of an allergic reaction.  Objective:  Vital signs in last 24 hours:  Blood pressure 138/87, pulse 88, temperature 98.2 F (36.8 C), temperature source Oral, resp. rate 18, height 5\' 3"  (1.6 m), weight 222 lb 4.8 oz (100.8 kg), SpO2 100%.   Resp: Lungs clear bilaterally Cardio: Regular rate and rhythm GI: No hepatosplenomegaly, nontender Vascular: No leg edema  Skin: Greater than 5 cm cutaneous mass of the left buttock with underlying induration extending over a larger area, 2-2.5 cm cutaneous mass of the left anterior thigh with a bilobed appearance.  2 cm hyperpigmented raised lesion at the left upper dorsal arm with underlying thickening   Lab Results:  Lab Results  Component Value Date   WBC 13.5 (H) 03/28/2023   HGB 12.7 03/28/2023   HCT 38.6 03/28/2023   MCV 85.0 03/28/2023   PLT 281 03/28/2023   NEUTROABS 8.9 (H) 03/28/2023    CMP  Lab Results  Component Value Date   NA 142 03/28/2023   K 4.1 03/28/2023   CL 103 03/28/2023   CO2 29 03/28/2023   GLUCOSE 114 (H) 03/28/2023   BUN 26 (H) 03/28/2023   CREATININE 1.29 (H) 03/28/2023   CALCIUM 10.2 03/28/2023   PROT 7.0 03/28/2023   ALBUMIN 4.3 03/28/2023   AST 11 (L) 03/28/2023   ALT 14 03/28/2023   ALKPHOS 99 03/28/2023   BILITOT 0.2 (L) 03/28/2023   GFRNONAA 45 (L) 03/28/2023    No results found for: "CEA1", "CEA", "WRU045", "CA125"  No  results found for: "INR", "LABPROT"  Imaging:  No results found.  Medications: I have reviewed the patient's current medications.   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 PET October 2013-numerous FDG avid subdermal/subcutaneous nodules and bone lesions Prednisone October 2013 with initial response, tapered until February 2014 when skin lesions enlarged Rituximab 4 weekly doses beginning, followed by rituximab maintenance with completion of 2 years January 2016 Disease progression July 2019-no response to rituximab or Gleevec Revlimid/Decadron August 2019-partial response 01/28/2022-enlarging cutaneous lesion left anterior thigh, possible small similar lesions at the left abdomen and left lower leg PET 03/11/2022-heterogenous mildly enlarged right thyroid with calcification, cutaneous left anterior thigh lesion with moderate metabolic activity, cutaneous lesion at the mid anterior chest-nonspecific, left upper lobe pulmonary nodule with low-level FDG activity less than mediastinal blood pool-nonspecific Thalidomide beginning 09/13/2022 Thalidomide discontinued 12/06/2022 secondary to apparent toxicities (malaise, headaches,1 weight gain (and no regression of the cutaneous lesions) PET scan 01/21/2023-intense hypermetabolic subcutaneous nodular thickening in the posterior left buttock region; similar metabolic cutaneous lesion anterior left thigh; intense metabolic activity lateral to the right lateral femoral condyle; intense radiotracer activity in the dorsum of the left foot superficial to the proximal third phalanx 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  Stable left upper lobe pulmonary nodule with very low metabolic activity. Methotrexate, 10 mg weekly, beginning 02/07/2023, last dose 04/04/2023 Methotrexate discontinued secondary to  development of a new lesion at the left upper arm and lack of  a Diabetes Hypertension Hyperlipidemia Asthma/seasonal allergies Left lung nodule on PET 03/11/2022 CT chest 07/12/2022-unchanged left upper lobe nodule, no new suspicious nodules or masses heterogenous enlargement of the thyroid 7.  Multinodular thyroid-2 right-sided nodules meet criteria for biopsy and a calcified nodule in the right mid thyroid meets criteria for surveillance imaging 8.  L5-S1 decompressive laminotomies 08/27/2022      Disposition: Shari Hill has Rosai-Dorfman disease.  She has been maintained on weekly methotrexate since 02/07/2023.  The dominant lesion at the left buttock appears softer and more mobile, but has not decreased significantly in size.  There is a new lesion at the left upper arm.  Methotrexate will be discontinued. Dr. Leotis Pain recommends a trial of single agent rituximab.  She has received rituximab in the past and reports tolerating rituximab well in the past.  We reviewed potential toxicities associated with rituximab including the chance of an allergic reaction, atypical infections, reactivation of hepatitis, pneumonitis, and CNS toxicity.  She agrees to proceed.  The plan is to begin weekly methotrexate on 04/20/2023.  She will be scheduled for an office visit with week 3 rituximab.  We will consider beginning a trial of cobimetinib if there is not a complete response to rituximab.  A treatment plan was entered today.  Thornton Papas, MD  04/11/2023  3:39 PM

## 2023-04-12 ENCOUNTER — Other Ambulatory Visit: Payer: Self-pay

## 2023-04-12 ENCOUNTER — Telehealth: Payer: Self-pay | Admitting: Oncology

## 2023-04-13 ENCOUNTER — Other Ambulatory Visit: Payer: Self-pay

## 2023-04-19 ENCOUNTER — Ambulatory Visit: Payer: Medicare Other

## 2023-04-19 ENCOUNTER — Other Ambulatory Visit: Payer: Medicare Other

## 2023-04-20 ENCOUNTER — Inpatient Hospital Stay: Payer: Medicare Other

## 2023-04-20 VITALS — BP 120/64 | HR 96 | Temp 98.4°F | Resp 18 | Ht 63.0 in | Wt 225.5 lb

## 2023-04-20 DIAGNOSIS — D763 Other histiocytosis syndromes: Secondary | ICD-10-CM

## 2023-04-20 LAB — CBC WITH DIFFERENTIAL (CANCER CENTER ONLY)
Abs Immature Granulocytes: 0.04 10*3/uL (ref 0.00–0.07)
Basophils Absolute: 0 10*3/uL (ref 0.0–0.1)
Basophils Relative: 0 %
Eosinophils Absolute: 0.1 10*3/uL (ref 0.0–0.5)
Eosinophils Relative: 1 %
HCT: 39.6 % (ref 36.0–46.0)
Hemoglobin: 13 g/dL (ref 12.0–15.0)
Immature Granulocytes: 1 %
Lymphocytes Relative: 28 %
Lymphs Abs: 2.4 10*3/uL (ref 0.7–4.0)
MCH: 28.6 pg (ref 26.0–34.0)
MCHC: 32.8 g/dL (ref 30.0–36.0)
MCV: 87 fL (ref 80.0–100.0)
Monocytes Absolute: 0.5 10*3/uL (ref 0.1–1.0)
Monocytes Relative: 6 %
Neutro Abs: 5.5 10*3/uL (ref 1.7–7.7)
Neutrophils Relative %: 64 %
Platelet Count: 225 10*3/uL (ref 150–400)
RBC: 4.55 MIL/uL (ref 3.87–5.11)
RDW: 18.9 % — ABNORMAL HIGH (ref 11.5–15.5)
WBC Count: 8.5 10*3/uL (ref 4.0–10.5)
nRBC: 0 % (ref 0.0–0.2)

## 2023-04-20 LAB — CMP (CANCER CENTER ONLY)
ALT: 10 U/L (ref 0–44)
AST: 10 U/L — ABNORMAL LOW (ref 15–41)
Albumin: 4.3 g/dL (ref 3.5–5.0)
Alkaline Phosphatase: 88 U/L (ref 38–126)
Anion gap: 11 (ref 5–15)
BUN: 24 mg/dL — ABNORMAL HIGH (ref 8–23)
CO2: 26 mmol/L (ref 22–32)
Calcium: 9.4 mg/dL (ref 8.9–10.3)
Chloride: 103 mmol/L (ref 98–111)
Creatinine: 1.29 mg/dL — ABNORMAL HIGH (ref 0.44–1.00)
GFR, Estimated: 45 mL/min — ABNORMAL LOW (ref >60)
Glucose, Bld: 301 mg/dL — ABNORMAL HIGH (ref 70–99)
Potassium: 3.8 mmol/L (ref 3.5–5.1)
Sodium: 140 mmol/L (ref 135–145)
Total Bilirubin: 0.4 mg/dL (ref 0.3–1.2)
Total Protein: 7.2 g/dL (ref 6.5–8.1)

## 2023-04-20 LAB — HEPATITIS B SURFACE ANTIGEN: Hepatitis B Surface Ag: NONREACTIVE

## 2023-04-20 LAB — HEPATITIS B CORE ANTIBODY, TOTAL: Hep B Core Total Ab: NONREACTIVE

## 2023-04-20 MED ORDER — SODIUM CHLORIDE 0.9 % IV SOLN
375.0000 mg/m2 | Freq: Once | INTRAVENOUS | Status: AC
  Administered 2023-04-20: 800 mg via INTRAVENOUS
  Filled 2023-04-20: qty 50

## 2023-04-20 MED ORDER — PROCHLORPERAZINE MALEATE 10 MG PO TABS
10.0000 mg | ORAL_TABLET | Freq: Four times a day (QID) | ORAL | Status: DC | PRN
  Administered 2023-04-20: 10 mg via ORAL
  Filled 2023-04-20: qty 1

## 2023-04-20 MED ORDER — DIPHENHYDRAMINE HCL 25 MG PO CAPS
50.0000 mg | ORAL_CAPSULE | Freq: Once | ORAL | Status: AC
  Administered 2023-04-20: 50 mg via ORAL
  Filled 2023-04-20: qty 2

## 2023-04-20 MED ORDER — SODIUM CHLORIDE 0.9 % IV SOLN
375.0000 mg/m2 | Freq: Once | INTRAVENOUS | Status: DC
  Filled 2023-04-20: qty 80

## 2023-04-20 MED ORDER — SODIUM CHLORIDE 0.9 % IV SOLN: Freq: Once | INTRAVENOUS | Status: AC

## 2023-04-20 MED ORDER — ACETAMINOPHEN 325 MG PO TABS
650.0000 mg | ORAL_TABLET | Freq: Once | ORAL | Status: AC
  Administered 2023-04-20: 650 mg via ORAL
  Filled 2023-04-20: qty 2

## 2023-04-20 NOTE — Progress Notes (Signed)
Roxan Diesel, NP at chairside. Pt continues with complaints of being cold. Vital signs stable. Per Misty Stanley hold infusion at current rate until treatment completion. Continue to monitor. Pt also complaining of nausea. Verbal order received.

## 2023-04-20 NOTE — Progress Notes (Signed)
Dr Truett Perna notified that patient ambulated to restroom and has been chilled since.

## 2023-04-20 NOTE — Patient Instructions (Signed)
[facility]   Discharge Instructions: Thank you for choosing [facility] to provide your oncology and hematology care.   If you have a lab appointment with the Cancer Center, please go directly to the Cancer Center and check in at the registration area.   Wear comfortable clothing and clothing appropriate for easy access to any Portacath or PICC line.   We strive to give you quality time with your provider. You may need to reschedule your appointment if you arrive late (15 or more minutes).  Arriving late affects you and other patients whose appointments are after yours.  Also, if you miss three or more appointments without notifying the office, you may be dismissed from the clinic at the provider's discretion.      For prescription refill requests, have your pharmacy contact our office and allow 72 hours for refills to be completed.    Today you received the following chemotherapy and/or immunotherapy agents Rituximab.      To help prevent nausea and vomiting after your treatment, we encourage you to take your nausea medication as directed.  BELOW ARE SYMPTOMS THAT SHOULD BE REPORTED IMMEDIATELY: *FEVER GREATER THAN 100.4 F (38 C) OR HIGHER *CHILLS OR SWEATING *NAUSEA AND VOMITING THAT IS NOT CONTROLLED WITH YOUR NAUSEA MEDICATION *UNUSUAL SHORTNESS OF BREATH *UNUSUAL BRUISING OR BLEEDING *URINARY PROBLEMS (pain or burning when urinating, or frequent urination) *BOWEL PROBLEMS (unusual diarrhea, constipation, pain near the anus) TENDERNESS IN MOUTH AND THROAT WITH OR WITHOUT PRESENCE OF ULCERS (sore throat, sores in mouth, or a toothache) UNUSUAL RASH, SWELLING OR PAIN  UNUSUAL VAGINAL DISCHARGE OR ITCHING   Items with * indicate a potential emergency and should be followed up as soon as possible or go to the Emergency Department if any problems should occur.  Please show the CHEMOTHERAPY ALERT CARD or IMMUNOTHERAPY ALERT CARD at  check-in to the Emergency Department and triage nurse.  Should you have questions after your visit or need to cancel or reschedule your appointment, please contact [facility]  Dept: [phone]  and follow the prompts.  Office hours are 8:00 a.m. to 4:30 p.m. Monday - Friday. Please note that voicemails left after 4:00 p.m. may not be returned until the following business day.  We are closed weekends and major holidays. You have access to a nurse at all times for urgent questions. Please call the main number to the clinic Dept: [phone] and follow the prompts.   For any non-urgent questions, you may also contact your provider using MyChart. We now offer e-Visits for anyone 16 and older to request care online for non-urgent symptoms. For details visit [email or web address].   Also download the MyChart app! Go to the app store, search "MyChart", open the app, select [facility], and log in with your MyChart username and password.  Rituximab Injection What is this medication? RITUXIMAB (ri TUX i mab) treats leukemia and lymphoma. It works by blocking a protein that causes cancer cells to grow and multiply. This helps to slow or stop the spread of cancer cells. It may also be used to treat autoimmune conditions, such as arthritis. It works by slowing down an overactive immune system. It is a monoclonal antibody. This medicine may be used for other purposes; ask your health care provider or pharmacist if you have questions. COMMON BRAND NAME(S): RIABNI, Rituxan, RUXIENCE, truxima What should I tell my care team before I take this medication? They need to  know if you have any of these conditions: Chest pain Heart disease Immune system problems Infection, such as chickenpox, cold sores, hepatitis B, herpes Irregular heartbeat or rhythm Kidney disease Low blood counts, such as low white cells, platelets, red cells Lung disease Recent or upcoming vaccine An  unusual or allergic reaction to rituximab, other medications, foods, dyes, or preservatives Pregnant or trying to get pregnant Breast-feeding How should I use this medication? This medication is injected into a vein. It is given by a care team in a hospital or clinic setting. A special MedGuide will be given to you before each treatment. Be sure to read this information carefully each time. Talk to your care team about the use of this medication in children. While this medication may be prescribed for children as young as 6 months for selected conditions, precautions do apply. Overdosage: If you think you have taken too much of this medicine contact a poison control center or emergency room at once. NOTE: This medicine is only for you. Do not share this medicine with others. What if I miss a dose? Keep appointments for follow-up doses. It is important not to miss your dose. Call your care team if you are unable to keep an appointment. What may interact with this medication? Do not take this medication with any of the following: Live vaccines This medication may also interact with the following: Cisplatin This list may not describe all possible interactions. Give your health care provider a list of all the medicines, herbs, non-prescription drugs, or dietary supplements you use. Also tell them if you smoke, drink alcohol, or use illegal drugs. Some items may interact with your medicine. What should I watch for while using this medication? Your condition will be monitored carefully while you are receiving this medication. You may need blood work while taking this medication. This medication can cause serious infusion reactions. To reduce the risk your care team may give you other medications to take before receiving this one. Be sure to follow the directions from your care team. This medication may increase your risk of getting an infection. Call your care team for advice if you get a fever,  chills, sore throat, or other symptoms of a cold or flu. Do not treat yourself. Try to avoid being around people who are sick. Call your care team if you are around anyone with measles, chickenpox, or if you develop sores or blisters that do not heal properly. Avoid taking medications that contain aspirin, acetaminophen, ibuprofen, naproxen, or ketoprofen unless instructed by your care team. These medications may hide a fever. This medication may cause serious skin reactions. They can happen weeks to months after starting the medication. Contact your care team right away if you notice fevers or flu-like symptoms with a rash. The rash may be red or purple and then turn into blisters or peeling of the skin. You may also notice a red rash with swelling of the face, lips, or lymph nodes in your neck or under your arms. In some patients, this medication may cause a serious brain infection that may cause death. If you have any problems seeing, thinking, speaking, walking, or standing, tell your care team right away. If you cannot reach your care team, urgently seek another source of medical care. Talk to your care team if you may be pregnant. Serious birth defects can occur if you take this medication during pregnancy and for 12 months after the last dose. You will need a negative pregnancy test  before starting this medication. Contraception is recommended while taking this medication and for 12 months after the last dose. Your care team can help you find the option that works for you. Do not breastfeed while taking this medication and for at least 6 months after the last dose. What side effects may I notice from receiving this medication? Side effects that you should report to your care team as soon as possible: Allergic reactions or angioedema--skin rash, itching or hives, swelling of the face, eyes, lips, tongue, arms, or legs, trouble swallowing or breathing Bowel blockage--stomach cramping, unable to have a  bowel movement or pass gas, loss of appetite, vomiting Dizziness, loss of balance or coordination, confusion or trouble speaking Heart attack--pain or tightness in the chest, shoulders, arms, or jaw, nausea, shortness of breath, cold or clammy skin, feeling faint or lightheaded Heart rhythm changes--fast or irregular heartbeat, dizziness, feeling faint or lightheaded, chest pain, trouble breathing Infection--fever, chills, cough, sore throat, wounds that don't heal, pain or trouble when passing urine, general feeling of discomfort or being unwell Infusion reactions--chest pain, shortness of breath or trouble breathing, feeling faint or lightheaded Kidney injury--decrease in the amount of urine, swelling of the ankles, hands, or feet Liver injury--right upper belly pain, loss of appetite, nausea, light-colored stool, dark yellow or brown urine, yellowing skin or eyes, unusual weakness or fatigue Redness, blistering, peeling, or loosening of the skin, including inside the mouth Stomach pain that is severe, does not go away, or gets worse Tumor lysis syndrome (TLS)--nausea, vomiting, diarrhea, decrease in the amount of urine, dark urine, unusual weakness or fatigue, confusion, muscle pain or cramps, fast or irregular heartbeat, joint pain Side effects that usually do not require medical attention (report to your care team if they continue or are bothersome): Headache Joint pain Nausea Runny or stuffy nose Unusual weakness or fatigue This list may not describe all possible side effects. Call your doctor for medical advice about side effects. You may report side effects to FDA at 1-800-FDA-1088. Where should I keep my medication? This medication is given in a hospital or clinic. It will not be stored at home. NOTE: This sheet is a summary. It may not cover all possible information. If you have questions about this medicine, talk to your doctor, pharmacist, or health care provider.  2024  Elsevier/Gold Standard (2021-12-31 00:00:00)

## 2023-04-20 NOTE — Progress Notes (Signed)
Pt completed infusion, continued complaints of being cold. Headache was eased with some snacks.

## 2023-04-20 NOTE — Progress Notes (Signed)
Pt continues to be chilled. Vital signs are stable, no other complaints.

## 2023-04-21 ENCOUNTER — Telehealth: Payer: Self-pay | Admitting: Emergency Medicine

## 2023-04-21 NOTE — Telephone Encounter (Signed)
24 Hour Callback 24 Hour Callback post 1st time Rituximab infusion. Patient reports that after she arrived home she continued to feel pretty bad. Patient reports a fever of 102 for about 2 hours. Took to Tylenol. Patient states that today she feels great. No complaints. Pt knows to call with any questions or concerns.

## 2023-04-24 ENCOUNTER — Other Ambulatory Visit: Payer: Self-pay | Admitting: Oncology

## 2023-04-24 DIAGNOSIS — D763 Other histiocytosis syndromes: Secondary | ICD-10-CM

## 2023-04-25 ENCOUNTER — Other Ambulatory Visit: Payer: Self-pay

## 2023-04-26 ENCOUNTER — Ambulatory Visit: Payer: Medicare Other | Admitting: Nurse Practitioner

## 2023-04-26 ENCOUNTER — Other Ambulatory Visit: Payer: Medicare Other

## 2023-04-27 ENCOUNTER — Encounter: Payer: Self-pay | Admitting: Nurse Practitioner

## 2023-04-27 ENCOUNTER — Inpatient Hospital Stay: Payer: Medicare Other | Attending: Oncology

## 2023-04-27 ENCOUNTER — Inpatient Hospital Stay: Payer: Medicare Other

## 2023-04-27 ENCOUNTER — Inpatient Hospital Stay (HOSPITAL_BASED_OUTPATIENT_CLINIC_OR_DEPARTMENT_OTHER): Payer: Medicare Other | Admitting: Nurse Practitioner

## 2023-04-27 VITALS — BP 117/76 | HR 80 | Temp 98.2°F | Resp 18 | Ht 63.0 in | Wt 227.0 lb

## 2023-04-27 VITALS — BP 110/67 | HR 73 | Temp 98.2°F | Resp 18

## 2023-04-27 DIAGNOSIS — R110 Nausea: Secondary | ICD-10-CM | POA: Insufficient documentation

## 2023-04-27 DIAGNOSIS — E119 Type 2 diabetes mellitus without complications: Secondary | ICD-10-CM | POA: Insufficient documentation

## 2023-04-27 DIAGNOSIS — Z23 Encounter for immunization: Secondary | ICD-10-CM | POA: Diagnosis not present

## 2023-04-27 DIAGNOSIS — E785 Hyperlipidemia, unspecified: Secondary | ICD-10-CM | POA: Insufficient documentation

## 2023-04-27 DIAGNOSIS — D763 Other histiocytosis syndromes: Secondary | ICD-10-CM | POA: Diagnosis present

## 2023-04-27 DIAGNOSIS — J45909 Unspecified asthma, uncomplicated: Secondary | ICD-10-CM | POA: Diagnosis not present

## 2023-04-27 DIAGNOSIS — R911 Solitary pulmonary nodule: Secondary | ICD-10-CM | POA: Insufficient documentation

## 2023-04-27 DIAGNOSIS — Z5112 Encounter for antineoplastic immunotherapy: Secondary | ICD-10-CM | POA: Insufficient documentation

## 2023-04-27 DIAGNOSIS — I10 Essential (primary) hypertension: Secondary | ICD-10-CM | POA: Insufficient documentation

## 2023-04-27 LAB — CBC WITH DIFFERENTIAL (CANCER CENTER ONLY)
Abs Immature Granulocytes: 0.05 10*3/uL (ref 0.00–0.07)
Basophils Absolute: 0.1 10*3/uL (ref 0.0–0.1)
Basophils Relative: 1 %
Eosinophils Absolute: 0.1 10*3/uL (ref 0.0–0.5)
Eosinophils Relative: 1 %
HCT: 40.6 % (ref 36.0–46.0)
Hemoglobin: 13.3 g/dL (ref 12.0–15.0)
Immature Granulocytes: 1 %
Lymphocytes Relative: 23 %
Lymphs Abs: 2.1 10*3/uL (ref 0.7–4.0)
MCH: 28.5 pg (ref 26.0–34.0)
MCHC: 32.8 g/dL (ref 30.0–36.0)
MCV: 87.1 fL (ref 80.0–100.0)
Monocytes Absolute: 0.6 10*3/uL (ref 0.1–1.0)
Monocytes Relative: 7 %
Neutro Abs: 6.1 10*3/uL (ref 1.7–7.7)
Neutrophils Relative %: 67 %
Platelet Count: 239 10*3/uL (ref 150–400)
RBC: 4.66 MIL/uL (ref 3.87–5.11)
RDW: 18.2 % — ABNORMAL HIGH (ref 11.5–15.5)
WBC Count: 8.9 10*3/uL (ref 4.0–10.5)
nRBC: 0 % (ref 0.0–0.2)

## 2023-04-27 LAB — CMP (CANCER CENTER ONLY)
ALT: 10 U/L (ref 0–44)
AST: 12 U/L — ABNORMAL LOW (ref 15–41)
Albumin: 4.3 g/dL (ref 3.5–5.0)
Alkaline Phosphatase: 84 U/L (ref 38–126)
Anion gap: 11 (ref 5–15)
BUN: 22 mg/dL (ref 8–23)
CO2: 27 mmol/L (ref 22–32)
Calcium: 9.5 mg/dL (ref 8.9–10.3)
Chloride: 102 mmol/L (ref 98–111)
Creatinine: 1.19 mg/dL — ABNORMAL HIGH (ref 0.44–1.00)
GFR, Estimated: 50 mL/min — ABNORMAL LOW (ref >60)
Glucose, Bld: 285 mg/dL — ABNORMAL HIGH (ref 70–99)
Potassium: 3.9 mmol/L (ref 3.5–5.1)
Sodium: 140 mmol/L (ref 135–145)
Total Bilirubin: 0.4 mg/dL (ref 0.3–1.2)
Total Protein: 7.1 g/dL (ref 6.5–8.1)

## 2023-04-27 MED ORDER — ACETAMINOPHEN 325 MG PO TABS
650.0000 mg | ORAL_TABLET | Freq: Once | ORAL | Status: AC
  Administered 2023-04-27: 650 mg via ORAL
  Filled 2023-04-27: qty 2

## 2023-04-27 MED ORDER — SODIUM CHLORIDE 0.9 % IV SOLN: Freq: Once | INTRAVENOUS | Status: AC

## 2023-04-27 MED ORDER — FAMOTIDINE IN NACL 20-0.9 MG/50ML-% IV SOLN
20.0000 mg | Freq: Once | INTRAVENOUS | Status: AC
  Administered 2023-04-27: 20 mg via INTRAVENOUS

## 2023-04-27 MED ORDER — PROCHLORPERAZINE MALEATE 10 MG PO TABS
10.0000 mg | ORAL_TABLET | Freq: Four times a day (QID) | ORAL | Status: DC | PRN
  Administered 2023-04-27: 10 mg via ORAL
  Filled 2023-04-27: qty 1

## 2023-04-27 MED ORDER — SODIUM CHLORIDE 0.9 % IV SOLN
375.0000 mg/m2 | Freq: Once | INTRAVENOUS | Status: AC
  Administered 2023-04-27: 800 mg via INTRAVENOUS
  Filled 2023-04-27: qty 50

## 2023-04-27 MED ORDER — DIPHENHYDRAMINE HCL 25 MG PO CAPS
50.0000 mg | ORAL_CAPSULE | Freq: Once | ORAL | Status: AC
  Administered 2023-04-27: 50 mg via ORAL
  Filled 2023-04-27: qty 2

## 2023-04-27 NOTE — Progress Notes (Signed)
Patient seen by Lonna Cobb NP today  Vitals are within treatment parameters.  Labs reviewed by Lonna Cobb NP and are within treatment parameters.  Per physician team, patient is ready for treatment. Please note that modifications are being made to the treatment plan including added pre-medication.

## 2023-04-27 NOTE — Progress Notes (Signed)
[facility] OFFICE PROGRESS NOTE   Diagnosis: Rosai-Dorfman disease  INTERVAL HISTORY:   [facility] returns as scheduled.  She completed cycle 1 week 1 Rituxan 04/20/2023.  She became very "cold" during the infusion.  No rigors.  24-hour nurse to call back notes patient reported a fever of 102 after returning home following treatment.  She had a single fever of 102 degrees the night of treatment.  She took Tylenol.  The fever did not recur.  She felt cold throughout the treatment, teeth were "chattering".  She did not develop shaking chills.  No rash.  She had nausea during the treatment.  The nausea resolved with a dose of Compazine.  She feels skin lesions are unchanged.  Objective:  Vital signs in last 24 hours:  Blood pressure 117/76, pulse 80, temperature 98.2 F (36.8 C), temperature source Oral, resp. rate 18, height 5\' 3"  (1.6 m), weight 227 lb (103 kg), SpO2 100%.    HEENT: No thrush or ulcers. Resp: Lungs clear bilaterally. Cardio: Regular rate and rhythm. GI: No hepatosplenomegaly. Vascular: No leg edema. Skin: Cutaneous mass at the left buttock appears unchanged.  Approximate 2 cm cutaneous mass left anterior thigh, similar appearing approximate 2 cm lesion at the left upper arm.   Lab Results:  Lab Results  Component Value Date   WBC 8.9 04/27/2023   HGB 13.3 04/27/2023   HCT 40.6 04/27/2023   MCV 87.1 04/27/2023   PLT 239 04/27/2023   NEUTROABS 6.1 04/27/2023    Imaging:  No results found.  Medications: I have reviewed the patient's current medications.  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 PET October 2013-numerous FDG avid subdermal/subcutaneous nodules and bone lesions Prednisone October 2013 with initial response, tapered until February 2014 when skin lesions enlarged Rituximab 4 weekly doses beginning, followed by rituximab maintenance with completion of 2 years January  2016 Disease progression July 2019-no response to rituximab or Gleevec Revlimid/Decadron August 2019-partial response 01/28/2022-enlarging cutaneous lesion left anterior thigh, possible small similar lesions at the left abdomen and left lower leg PET 03/11/2022-heterogenous mildly enlarged right thyroid with calcification, cutaneous left anterior thigh lesion with moderate metabolic activity, cutaneous lesion at the mid anterior chest-nonspecific, left upper lobe pulmonary nodule with low-level FDG activity less than mediastinal blood pool-nonspecific Thalidomide beginning 09/13/2022 Thalidomide discontinued 12/06/2022 secondary to apparent toxicities (malaise, headaches,1 weight gain (and no regression of the cutaneous lesions) PET scan 01/21/2023-intense hypermetabolic subcutaneous nodular thickening in the posterior left buttock region; similar metabolic cutaneous lesion anterior left thigh; intense metabolic activity lateral to the right lateral femoral condyle; intense radiotracer activity in the dorsum of the left foot superficial to the proximal third phalanx 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  Stable left upper lobe pulmonary nodule with very low metabolic activity. Methotrexate, 10 mg weekly, beginning 02/07/2023, last dose 04/04/2023 Methotrexate discontinued secondary to development of a new lesion at the left upper arm and lack of a Cycle 1 weekly Rituxan 04/20/2023 Cycle 2 weekly Rituxan 04/27/2023 Diabetes Hypertension Hyperlipidemia Asthma/seasonal allergies Left lung nodule on PET 03/11/2022 CT chest 07/12/2022-unchanged left upper lobe nodule, no new suspicious nodules or masses heterogenous enlargement of the thyroid 7.  Multinodular thyroid-2 right-sided nodules meet criteria for biopsy and a calcified nodule in the right mid thyroid meets criteria for surveillance imaging 8.  L5-S1 decompressive laminotomies 08/27/2022    Disposition: [facility] appears stable.  She has completed 1  cycle of weekly Rituxan.  She had a mild  infusion related reaction with chills.  She also had nausea.  She was able to complete the infusion.  She had a fever upon returning home.  We will adjust the premedication regimen today to include Compazine and Pepcid, continue Tylenol and Benadryl.  We discussed Solu-Medrol but decided to hold off on this for now due to baseline elevated blood sugar.  Plan to proceed with week 2 Rituxan today as scheduled.  CBC and chemistry panel reviewed.  Labs adequate to proceed as above.  She will return for week 3 Rituxan in 1 week.  We will see her in follow-up and Rituxan in 2 weeks.    Lonna Cobb ANP/GNP-BC   04/27/2023  12:05 PM

## 2023-04-27 NOTE — Patient Instructions (Signed)
[place] [facility] AT [facility]   Discharge Instructions: Thank you for choosing [facility] to provide your oncology and hematology care.   If you have a lab appointment with the Cancer Center, please go directly to the Cancer Center and check in at the registration area.   Wear comfortable clothing and clothing appropriate for easy access to any Portacath or PICC line.   We strive to give you quality time with your provider. You may need to reschedule your appointment if you arrive late (15 or more minutes).  Arriving late affects you and other patients whose appointments are after yours.  Also, if you miss three or more appointments without notifying the office, you may be dismissed from the clinic at the provider's discretion.      For prescription refill requests, have your pharmacy contact our office and allow 72 hours for refills to be completed.    Today you received the following chemotherapy and/or immunotherapy agents Rituximab.      To help prevent nausea and vomiting after your treatment, we encourage you to take your nausea medication as directed.  BELOW ARE SYMPTOMS THAT SHOULD BE REPORTED IMMEDIATELY: *FEVER GREATER THAN 100.4 F (38 C) OR HIGHER *CHILLS OR SWEATING *NAUSEA AND VOMITING THAT IS NOT CONTROLLED WITH YOUR NAUSEA MEDICATION *UNUSUAL SHORTNESS OF BREATH *UNUSUAL BRUISING OR BLEEDING *URINARY PROBLEMS (pain or burning when urinating, or frequent urination) *BOWEL PROBLEMS (unusual diarrhea, constipation, pain near the anus) TENDERNESS IN MOUTH AND THROAT WITH OR WITHOUT PRESENCE OF ULCERS (sore throat, sores in mouth, or a toothache) UNUSUAL RASH, SWELLING OR PAIN  UNUSUAL VAGINAL DISCHARGE OR ITCHING   Items with * indicate a potential emergency and should be followed up as soon as possible or go to the Emergency Department if any problems should occur.  Please show the CHEMOTHERAPY ALERT CARD or IMMUNOTHERAPY ALERT CARD at  check-in to the Emergency Department and triage nurse.  Should you have questions after your visit or need to cancel or reschedule your appointment, please contact [place] [facility] AT [facility]  Dept: [phone]  and follow the prompts.  Office hours are 8:00 a.m. to 4:30 p.m. Monday - Friday. Please note that voicemails left after 4:00 p.m. may not be returned until the following business day.  We are closed weekends and major holidays. You have access to a nurse at all times for urgent questions. Please call the main number to the clinic Dept: [phone] and follow the prompts.   For any non-urgent questions, you may also contact your provider using MyChart. We now offer e-Visits for anyone 16 and older to request care online for non-urgent symptoms. For details visit [email or web address].   Also download the MyChart app! Go to the app store, search "MyChart", open the app, select [place], and log in with your MyChart username and password.  Rituximab Injection What is this medication? RITUXIMAB (ri TUX i mab) treats leukemia and lymphoma. It works by blocking a protein that causes cancer cells to grow and multiply. This helps to slow or stop the spread of cancer cells. It may also be used to treat autoimmune conditions, such as arthritis. It works by slowing down an overactive immune system. It is a monoclonal antibody. This medicine may be used for other purposes; ask your health care provider or pharmacist if you have questions. COMMON BRAND NAME(S): RIABNI, Rituxan, RUXIENCE, truxima What should I tell my care team before I take this medication? They need to  know if you have any of these conditions: Chest pain Heart disease Immune system problems Infection, such as chickenpox, cold sores, hepatitis B, herpes Irregular heartbeat or rhythm Kidney disease Low blood counts, such as low white cells, platelets, red cells Lung disease Recent or upcoming vaccine An  unusual or allergic reaction to rituximab, other medications, foods, dyes, or preservatives Pregnant or trying to get pregnant Breast-feeding How should I use this medication? This medication is injected into a vein. It is given by a care team in a hospital or clinic setting. A special MedGuide will be given to you before each treatment. Be sure to read this information carefully each time. Talk to your care team about the use of this medication in children. While this medication may be prescribed for children as young as 6 months for selected conditions, precautions do apply. Overdosage: If you think you have taken too much of this medicine contact a poison control center or emergency room at once. NOTE: This medicine is only for you. Do not share this medicine with others. What if I miss a dose? Keep appointments for follow-up doses. It is important not to miss your dose. Call your care team if you are unable to keep an appointment. What may interact with this medication? Do not take this medication with any of the following: Live vaccines This medication may also interact with the following: Cisplatin This list may not describe all possible interactions. Give your health care provider a list of all the medicines, herbs, non-prescription drugs, or dietary supplements you use. Also tell them if you smoke, drink alcohol, or use illegal drugs. Some items may interact with your medicine. What should I watch for while using this medication? Your condition will be monitored carefully while you are receiving this medication. You may need blood work while taking this medication. This medication can cause serious infusion reactions. To reduce the risk your care team may give you other medications to take before receiving this one. Be sure to follow the directions from your care team. This medication may increase your risk of getting an infection. Call your care team for advice if you get a fever,  chills, sore throat, or other symptoms of a cold or flu. Do not treat yourself. Try to avoid being around people who are sick. Call your care team if you are around anyone with measles, chickenpox, or if you develop sores or blisters that do not heal properly. Avoid taking medications that contain aspirin, acetaminophen, ibuprofen, naproxen, or ketoprofen unless instructed by your care team. These medications may hide a fever. This medication may cause serious skin reactions. They can happen weeks to months after starting the medication. Contact your care team right away if you notice fevers or flu-like symptoms with a rash. The rash may be red or purple and then turn into blisters or peeling of the skin. You may also notice a red rash with swelling of the face, lips, or lymph nodes in your neck or under your arms. In some patients, this medication may cause a serious brain infection that may cause death. If you have any problems seeing, thinking, speaking, walking, or standing, tell your care team right away. If you cannot reach your care team, urgently seek another source of medical care. Talk to your care team if you may be pregnant. Serious birth defects can occur if you take this medication during pregnancy and for 12 months after the last dose. You will need a negative pregnancy test  before starting this medication. Contraception is recommended while taking this medication and for 12 months after the last dose. Your care team can help you find the option that works for you. Do not breastfeed while taking this medication and for at least 6 months after the last dose. What side effects may I notice from receiving this medication? Side effects that you should report to your care team as soon as possible: Allergic reactions or angioedema--skin rash, itching or hives, swelling of the face, eyes, lips, tongue, arms, or legs, trouble swallowing or breathing Bowel blockage--stomach cramping, unable to have a  bowel movement or pass gas, loss of appetite, vomiting Dizziness, loss of balance or coordination, confusion or trouble speaking Heart attack--pain or tightness in the chest, shoulders, arms, or jaw, nausea, shortness of breath, cold or clammy skin, feeling faint or lightheaded Heart rhythm changes--fast or irregular heartbeat, dizziness, feeling faint or lightheaded, chest pain, trouble breathing Infection--fever, chills, cough, sore throat, wounds that don't heal, pain or trouble when passing urine, general feeling of discomfort or being unwell Infusion reactions--chest pain, shortness of breath or trouble breathing, feeling faint or lightheaded Kidney injury--decrease in the amount of urine, swelling of the ankles, hands, or feet Liver injury--right upper belly pain, loss of appetite, nausea, light-colored stool, dark yellow or brown urine, yellowing skin or eyes, unusual weakness or fatigue Redness, blistering, peeling, or loosening of the skin, including inside the mouth Stomach pain that is severe, does not go away, or gets worse Tumor lysis syndrome (TLS)--nausea, vomiting, diarrhea, decrease in the amount of urine, dark urine, unusual weakness or fatigue, confusion, muscle pain or cramps, fast or irregular heartbeat, joint pain Side effects that usually do not require medical attention (report to your care team if they continue or are bothersome): Headache Joint pain Nausea Runny or stuffy nose Unusual weakness or fatigue This list may not describe all possible side effects. Call your doctor for medical advice about side effects. You may report side effects to FDA at 1-800-FDA-1088. Where should I keep my medication? This medication is given in a hospital or clinic. It will not be stored at home. NOTE: This sheet is a summary. It may not cover all possible information. If you have questions about this medicine, talk to your doctor, pharmacist, or health care provider.  2024  Elsevier/Gold Standard (2021-12-31 00:00:00)

## 2023-04-28 ENCOUNTER — Other Ambulatory Visit: Payer: Self-pay

## 2023-05-01 ENCOUNTER — Other Ambulatory Visit: Payer: Self-pay | Admitting: Oncology

## 2023-05-04 ENCOUNTER — Inpatient Hospital Stay: Payer: Medicare Other

## 2023-05-04 VITALS — BP 102/56 | HR 74 | Temp 98.1°F | Resp 18 | Ht 63.0 in | Wt 228.6 lb

## 2023-05-04 DIAGNOSIS — D763 Other histiocytosis syndromes: Secondary | ICD-10-CM

## 2023-05-04 LAB — CMP (CANCER CENTER ONLY)
ALT: 12 U/L (ref 0–44)
AST: 11 U/L — ABNORMAL LOW (ref 15–41)
Albumin: 4.5 g/dL (ref 3.5–5.0)
Alkaline Phosphatase: 92 U/L (ref 38–126)
Anion gap: 12 (ref 5–15)
BUN: 22 mg/dL (ref 8–23)
CO2: 26 mmol/L (ref 22–32)
Calcium: 9.8 mg/dL (ref 8.9–10.3)
Chloride: 101 mmol/L (ref 98–111)
Creatinine: 1.31 mg/dL — ABNORMAL HIGH (ref 0.44–1.00)
GFR, Estimated: 44 mL/min — ABNORMAL LOW (ref >60)
Glucose, Bld: 236 mg/dL — ABNORMAL HIGH (ref 70–99)
Potassium: 3.9 mmol/L (ref 3.5–5.1)
Sodium: 139 mmol/L (ref 135–145)
Total Bilirubin: 0.4 mg/dL (ref 0.3–1.2)
Total Protein: 7.3 g/dL (ref 6.5–8.1)

## 2023-05-04 LAB — CBC WITH DIFFERENTIAL (CANCER CENTER ONLY)
Abs Immature Granulocytes: 0.03 10*3/uL (ref 0.00–0.07)
Basophils Absolute: 0.1 10*3/uL (ref 0.0–0.1)
Basophils Relative: 1 %
Eosinophils Absolute: 0.1 10*3/uL (ref 0.0–0.5)
Eosinophils Relative: 1 %
HCT: 40.5 % (ref 36.0–46.0)
Hemoglobin: 13 g/dL (ref 12.0–15.0)
Immature Granulocytes: 0 %
Lymphocytes Relative: 22 %
Lymphs Abs: 2.4 10*3/uL (ref 0.7–4.0)
MCH: 28.3 pg (ref 26.0–34.0)
MCHC: 32.1 g/dL (ref 30.0–36.0)
MCV: 88.2 fL (ref 80.0–100.0)
Monocytes Absolute: 0.8 10*3/uL (ref 0.1–1.0)
Monocytes Relative: 7 %
Neutro Abs: 7.5 10*3/uL (ref 1.7–7.7)
Neutrophils Relative %: 69 %
Platelet Count: 240 10*3/uL (ref 150–400)
RBC: 4.59 MIL/uL (ref 3.87–5.11)
RDW: 18.1 % — ABNORMAL HIGH (ref 11.5–15.5)
WBC Count: 10.8 10*3/uL — ABNORMAL HIGH (ref 4.0–10.5)
nRBC: 0 % (ref 0.0–0.2)

## 2023-05-04 MED ORDER — FAMOTIDINE IN NACL 20-0.9 MG/50ML-% IV SOLN
20.0000 mg | Freq: Once | INTRAVENOUS | Status: AC
  Administered 2023-05-04: 20 mg via INTRAVENOUS
  Filled 2023-05-04: qty 50

## 2023-05-04 MED ORDER — SODIUM CHLORIDE 0.9 % IV SOLN
375.0000 mg/m2 | Freq: Once | INTRAVENOUS | Status: AC
  Administered 2023-05-04: 800 mg via INTRAVENOUS
  Filled 2023-05-04: qty 50

## 2023-05-04 MED ORDER — SODIUM CHLORIDE 0.9 % IV SOLN: Freq: Once | INTRAVENOUS | Status: AC

## 2023-05-04 MED ORDER — PROCHLORPERAZINE MALEATE 10 MG PO TABS
10.0000 mg | ORAL_TABLET | Freq: Four times a day (QID) | ORAL | Status: DC | PRN
  Administered 2023-05-04: 10 mg via ORAL
  Filled 2023-05-04: qty 1

## 2023-05-04 MED ORDER — DIPHENHYDRAMINE HCL 25 MG PO CAPS
50.0000 mg | ORAL_CAPSULE | Freq: Once | ORAL | Status: AC
  Administered 2023-05-04: 50 mg via ORAL
  Filled 2023-05-04: qty 2

## 2023-05-04 MED ORDER — ACETAMINOPHEN 325 MG PO TABS
650.0000 mg | ORAL_TABLET | Freq: Once | ORAL | Status: AC
  Administered 2023-05-04: 650 mg via ORAL
  Filled 2023-05-04: qty 2

## 2023-05-04 NOTE — Progress Notes (Deleted)
exam

## 2023-05-04 NOTE — Patient Instructions (Signed)
[place] [facility] AT [facility]   Discharge Instructions: Thank you for choosing [facility] to provide your oncology and hematology care.   If you have a lab appointment with the Cancer Center, please go directly to the Cancer Center and check in at the registration area.   Wear comfortable clothing and clothing appropriate for easy access to any Portacath or PICC line.   We strive to give you quality time with your provider. You may need to reschedule your appointment if you arrive late (15 or more minutes).  Arriving late affects you and other patients whose appointments are after yours.  Also, if you miss three or more appointments without notifying the office, you may be dismissed from the clinic at the provider's discretion.      For prescription refill requests, have your pharmacy contact our office and allow 72 hours for refills to be completed.    Today you received the following chemotherapy and/or immunotherapy agents Rituximab.      To help prevent nausea and vomiting after your treatment, we encourage you to take your nausea medication as directed.  BELOW ARE SYMPTOMS THAT SHOULD BE REPORTED IMMEDIATELY: *FEVER GREATER THAN 100.4 F (38 C) OR HIGHER *CHILLS OR SWEATING *NAUSEA AND VOMITING THAT IS NOT CONTROLLED WITH YOUR NAUSEA MEDICATION *UNUSUAL SHORTNESS OF BREATH *UNUSUAL BRUISING OR BLEEDING *URINARY PROBLEMS (pain or burning when urinating, or frequent urination) *BOWEL PROBLEMS (unusual diarrhea, constipation, pain near the anus) TENDERNESS IN MOUTH AND THROAT WITH OR WITHOUT PRESENCE OF ULCERS (sore throat, sores in mouth, or a toothache) UNUSUAL RASH, SWELLING OR PAIN  UNUSUAL VAGINAL DISCHARGE OR ITCHING   Items with * indicate a potential emergency and should be followed up as soon as possible or go to the Emergency Department if any problems should occur.  Please show the CHEMOTHERAPY ALERT CARD or IMMUNOTHERAPY ALERT CARD at  check-in to the Emergency Department and triage nurse.  Should you have questions after your visit or need to cancel or reschedule your appointment, please contact [place] [facility] AT [facility]  Dept: [phone]  and follow the prompts.  Office hours are 8:00 a.m. to 4:30 p.m. Monday - Friday. Please note that voicemails left after 4:00 p.m. may not be returned until the following business day.  We are closed weekends and major holidays. You have access to a nurse at all times for urgent questions. Please call the main number to the clinic Dept: [phone] and follow the prompts.   For any non-urgent questions, you may also contact your provider using MyChart. We now offer e-Visits for anyone 16 and older to request care online for non-urgent symptoms. For details visit [email or web address].   Also download the MyChart app! Go to the app store, search "MyChart", open the app, select [place], and log in with your MyChart username and password.  Rituximab Injection What is this medication? RITUXIMAB (ri TUX i mab) treats leukemia and lymphoma. It works by blocking a protein that causes cancer cells to grow and multiply. This helps to slow or stop the spread of cancer cells. It may also be used to treat autoimmune conditions, such as arthritis. It works by slowing down an overactive immune system. It is a monoclonal antibody. This medicine may be used for other purposes; ask your health care provider or pharmacist if you have questions. COMMON BRAND NAME(S): RIABNI, Rituxan, RUXIENCE, truxima What should I tell my care team before I take this medication? They need to  know if you have any of these conditions: Chest pain Heart disease Immune system problems Infection, such as chickenpox, cold sores, hepatitis B, herpes Irregular heartbeat or rhythm Kidney disease Low blood counts, such as low white cells, platelets, red cells Lung disease Recent or upcoming vaccine An  unusual or allergic reaction to rituximab, other medications, foods, dyes, or preservatives Pregnant or trying to get pregnant Breast-feeding How should I use this medication? This medication is injected into a vein. It is given by a care team in a hospital or clinic setting. A special MedGuide will be given to you before each treatment. Be sure to read this information carefully each time. Talk to your care team about the use of this medication in children. While this medication may be prescribed for children as young as 6 months for selected conditions, precautions do apply. Overdosage: If you think you have taken too much of this medicine contact a poison control center or emergency room at once. NOTE: This medicine is only for you. Do not share this medicine with others. What if I miss a dose? Keep appointments for follow-up doses. It is important not to miss your dose. Call your care team if you are unable to keep an appointment. What may interact with this medication? Do not take this medication with any of the following: Live vaccines This medication may also interact with the following: Cisplatin This list may not describe all possible interactions. Give your health care provider a list of all the medicines, herbs, non-prescription drugs, or dietary supplements you use. Also tell them if you smoke, drink alcohol, or use illegal drugs. Some items may interact with your medicine. What should I watch for while using this medication? Your condition will be monitored carefully while you are receiving this medication. You may need blood work while taking this medication. This medication can cause serious infusion reactions. To reduce the risk your care team may give you other medications to take before receiving this one. Be sure to follow the directions from your care team. This medication may increase your risk of getting an infection. Call your care team for advice if you get a fever,  chills, sore throat, or other symptoms of a cold or flu. Do not treat yourself. Try to avoid being around people who are sick. Call your care team if you are around anyone with measles, chickenpox, or if you develop sores or blisters that do not heal properly. Avoid taking medications that contain aspirin, acetaminophen, ibuprofen, naproxen, or ketoprofen unless instructed by your care team. These medications may hide a fever. This medication may cause serious skin reactions. They can happen weeks to months after starting the medication. Contact your care team right away if you notice fevers or flu-like symptoms with a rash. The rash may be red or purple and then turn into blisters or peeling of the skin. You may also notice a red rash with swelling of the face, lips, or lymph nodes in your neck or under your arms. In some patients, this medication may cause a serious brain infection that may cause death. If you have any problems seeing, thinking, speaking, walking, or standing, tell your care team right away. If you cannot reach your care team, urgently seek another source of medical care. Talk to your care team if you may be pregnant. Serious birth defects can occur if you take this medication during pregnancy and for 12 months after the last dose. You will need a negative pregnancy test  before starting this medication. Contraception is recommended while taking this medication and for 12 months after the last dose. Your care team can help you find the option that works for you. Do not breastfeed while taking this medication and for at least 6 months after the last dose. What side effects may I notice from receiving this medication? Side effects that you should report to your care team as soon as possible: Allergic reactions or angioedema--skin rash, itching or hives, swelling of the face, eyes, lips, tongue, arms, or legs, trouble swallowing or breathing Bowel blockage--stomach cramping, unable to have a  bowel movement or pass gas, loss of appetite, vomiting Dizziness, loss of balance or coordination, confusion or trouble speaking Heart attack--pain or tightness in the chest, shoulders, arms, or jaw, nausea, shortness of breath, cold or clammy skin, feeling faint or lightheaded Heart rhythm changes--fast or irregular heartbeat, dizziness, feeling faint or lightheaded, chest pain, trouble breathing Infection--fever, chills, cough, sore throat, wounds that don't heal, pain or trouble when passing urine, general feeling of discomfort or being unwell Infusion reactions--chest pain, shortness of breath or trouble breathing, feeling faint or lightheaded Kidney injury--decrease in the amount of urine, swelling of the ankles, hands, or feet Liver injury--right upper belly pain, loss of appetite, nausea, light-colored stool, dark yellow or brown urine, yellowing skin or eyes, unusual weakness or fatigue Redness, blistering, peeling, or loosening of the skin, including inside the mouth Stomach pain that is severe, does not go away, or gets worse Tumor lysis syndrome (TLS)--nausea, vomiting, diarrhea, decrease in the amount of urine, dark urine, unusual weakness or fatigue, confusion, muscle pain or cramps, fast or irregular heartbeat, joint pain Side effects that usually do not require medical attention (report to your care team if they continue or are bothersome): Headache Joint pain Nausea Runny or stuffy nose Unusual weakness or fatigue This list may not describe all possible side effects. Call your doctor for medical advice about side effects. You may report side effects to FDA at 1-800-FDA-1088. Where should I keep my medication? This medication is given in a hospital or clinic. It will not be stored at home. NOTE: This sheet is a summary. It may not cover all possible information. If you have questions about this medicine, talk to your doctor, pharmacist, or health care provider.  2024  Elsevier/Gold Standard (2021-12-31 00:00:00)

## 2023-05-11 ENCOUNTER — Encounter: Payer: Self-pay | Admitting: *Deleted

## 2023-05-11 ENCOUNTER — Inpatient Hospital Stay: Payer: Medicare Other

## 2023-05-11 ENCOUNTER — Inpatient Hospital Stay (HOSPITAL_BASED_OUTPATIENT_CLINIC_OR_DEPARTMENT_OTHER): Payer: Medicare Other | Admitting: Oncology

## 2023-05-11 VITALS — BP 86/53 | HR 79 | Temp 98.2°F | Resp 18

## 2023-05-11 DIAGNOSIS — Z23 Encounter for immunization: Secondary | ICD-10-CM

## 2023-05-11 DIAGNOSIS — D763 Other histiocytosis syndromes: Secondary | ICD-10-CM

## 2023-05-11 LAB — CMP (CANCER CENTER ONLY)
ALT: 9 U/L (ref 0–44)
AST: 11 U/L — ABNORMAL LOW (ref 15–41)
Albumin: 4.2 g/dL (ref 3.5–5.0)
Alkaline Phosphatase: 90 U/L (ref 38–126)
Anion gap: 12 (ref 5–15)
BUN: 22 mg/dL (ref 8–23)
CO2: 26 mmol/L (ref 22–32)
Calcium: 9.7 mg/dL (ref 8.9–10.3)
Chloride: 102 mmol/L (ref 98–111)
Creatinine: 1.1 mg/dL — ABNORMAL HIGH (ref 0.44–1.00)
GFR, Estimated: 55 mL/min — ABNORMAL LOW (ref >60)
Glucose, Bld: 254 mg/dL — ABNORMAL HIGH (ref 70–99)
Potassium: 3.8 mmol/L (ref 3.5–5.1)
Sodium: 140 mmol/L (ref 135–145)
Total Bilirubin: 0.3 mg/dL (ref 0.3–1.2)
Total Protein: 7.1 g/dL (ref 6.5–8.1)

## 2023-05-11 LAB — CBC WITH DIFFERENTIAL (CANCER CENTER ONLY)
Abs Immature Granulocytes: 0.03 10*3/uL (ref 0.00–0.07)
Basophils Absolute: 0 10*3/uL (ref 0.0–0.1)
Basophils Relative: 1 %
Eosinophils Absolute: 0.1 10*3/uL (ref 0.0–0.5)
Eosinophils Relative: 1 %
HCT: 39.3 % (ref 36.0–46.0)
Hemoglobin: 12.9 g/dL (ref 12.0–15.0)
Immature Granulocytes: 0 %
Lymphocytes Relative: 22 %
Lymphs Abs: 1.9 10*3/uL (ref 0.7–4.0)
MCH: 29.1 pg (ref 26.0–34.0)
MCHC: 32.8 g/dL (ref 30.0–36.0)
MCV: 88.7 fL (ref 80.0–100.0)
Monocytes Absolute: 0.6 10*3/uL (ref 0.1–1.0)
Monocytes Relative: 7 %
Neutro Abs: 5.9 10*3/uL (ref 1.7–7.7)
Neutrophils Relative %: 69 %
Platelet Count: 229 10*3/uL (ref 150–400)
RBC: 4.43 MIL/uL (ref 3.87–5.11)
RDW: 17.2 % — ABNORMAL HIGH (ref 11.5–15.5)
WBC Count: 8.5 10*3/uL (ref 4.0–10.5)
nRBC: 0 % (ref 0.0–0.2)

## 2023-05-11 MED ORDER — ACETAMINOPHEN 325 MG PO TABS
650.0000 mg | ORAL_TABLET | Freq: Once | ORAL | Status: AC
  Administered 2023-05-11: 650 mg via ORAL
  Filled 2023-05-11: qty 2

## 2023-05-11 MED ORDER — SODIUM CHLORIDE 0.9 % IV SOLN
375.0000 mg/m2 | Freq: Once | INTRAVENOUS | Status: AC
  Administered 2023-05-11: 800 mg via INTRAVENOUS
  Filled 2023-05-11: qty 50

## 2023-05-11 MED ORDER — INFLUENZA VAC A&B SURF ANT ADJ 0.5 ML IM SUSY
0.5000 mL | PREFILLED_SYRINGE | Freq: Once | INTRAMUSCULAR | Status: AC
  Administered 2023-05-11: 0.5 mL via INTRAMUSCULAR
  Filled 2023-05-11: qty 0.5

## 2023-05-11 MED ORDER — DIPHENHYDRAMINE HCL 25 MG PO CAPS
50.0000 mg | ORAL_CAPSULE | Freq: Once | ORAL | Status: AC
  Administered 2023-05-11: 50 mg via ORAL
  Filled 2023-05-11: qty 2

## 2023-05-11 MED ORDER — SODIUM CHLORIDE 0.9 % IV SOLN: Freq: Once | INTRAVENOUS | Status: AC

## 2023-05-11 MED ORDER — FAMOTIDINE IN NACL 20-0.9 MG/50ML-% IV SOLN
20.0000 mg | Freq: Once | INTRAVENOUS | Status: AC
  Administered 2023-05-11: 20 mg via INTRAVENOUS
  Filled 2023-05-11: qty 50

## 2023-05-11 MED ORDER — PROCHLORPERAZINE MALEATE 10 MG PO TABS
10.0000 mg | ORAL_TABLET | Freq: Four times a day (QID) | ORAL | Status: DC | PRN
  Administered 2023-05-11: 10 mg via ORAL
  Filled 2023-05-11: qty 1

## 2023-05-11 NOTE — Progress Notes (Signed)
Patient seen by Dr. Thornton Papas today  Vitals are within treatment parameters.   Labs reviewed by Dr. Truett Perna and are within treatment parameters.  Per physician team, patient is ready for treatment and there are NO modifications to the treatment plan.

## 2023-05-11 NOTE — Patient Instructions (Signed)
[place] [facility] AT [facility]  Discharge Instructions: Thank you for choosing [facility] to provide your oncology and hematology care.   If you have a lab appointment with the Cancer Center, please go directly to the Cancer Center and check in at the registration area.   Wear comfortable clothing and clothing appropriate for easy access to any Portacath or PICC line.   We strive to give you quality time with your provider. You may need to reschedule your appointment if you arrive late (15 or more minutes).  Arriving late affects you and other patients whose appointments are after yours.  Also, if you miss three or more appointments without notifying the office, you may be dismissed from the clinic at the provider's discretion.      For prescription refill requests, have your pharmacy contact our office and allow 72 hours for refills to be completed.    Today you received the following chemotherapy and/or immunotherapy agents Rituxan      To help prevent nausea and vomiting after your treatment, we encourage you to take your nausea medication as directed.  BELOW ARE SYMPTOMS THAT SHOULD BE REPORTED IMMEDIATELY: *FEVER GREATER THAN 100.4 F (38 C) OR HIGHER *CHILLS OR SWEATING *NAUSEA AND VOMITING THAT IS NOT CONTROLLED WITH YOUR NAUSEA MEDICATION *UNUSUAL SHORTNESS OF BREATH *UNUSUAL BRUISING OR BLEEDING *URINARY PROBLEMS (pain or burning when urinating, or frequent urination) *BOWEL PROBLEMS (unusual diarrhea, constipation, pain near the anus) TENDERNESS IN MOUTH AND THROAT WITH OR WITHOUT PRESENCE OF ULCERS (sore throat, sores in mouth, or a toothache) UNUSUAL RASH, SWELLING OR PAIN  UNUSUAL VAGINAL DISCHARGE OR ITCHING   Items with * indicate a potential emergency and should be followed up as soon as possible or go to the Emergency Department if any problems should occur.  Please show the CHEMOTHERAPY ALERT CARD or IMMUNOTHERAPY ALERT CARD at check-in  to the Emergency Department and triage nurse.  Should you have questions after your visit or need to cancel or reschedule your appointment, please contact [place] [facility] AT [facility]  Dept: [phone]  and follow the prompts.  Office hours are 8:00 a.m. to 4:30 p.m. Monday - Friday. Please note that voicemails left after 4:00 p.m. may not be returned until the following business day.  We are closed weekends and major holidays. You have access to a nurse at all times for urgent questions. Please call the main number to the clinic Dept: [phone] and follow the prompts.   For any non-urgent questions, you may also contact your provider using MyChart. We now offer e-Visits for anyone 66 and older to request care online for non-urgent symptoms. For details visit [email or web address].   Also download the MyChart app! Go to the app store, search "MyChart", open the app, select [place], and log in with your MyChart username and password.

## 2023-05-11 NOTE — Progress Notes (Signed)
[facility] OFFICE PROGRESS NOTE   Diagnosis: Rosai-Dorfman syndrome  INTERVAL HISTORY:   Shari Hill another cycle of rituximab on 05/04/2023.  She tolerated treatment well.  No symptom of allergic reaction.  She feels the left buttock lesion is smaller.  The lesion at the left upper anterior thigh is also slightly smaller.  The arm lesion is unchanged.  No new complaint.  Objective:  Vital signs in last 24 hours:  Blood pressure 105/68, pulse 82, temperature 98.2 F (36.8 C), temperature source Skin, resp. rate 18, height 5\' 3"  (1.6 m), weight 228 lb 3.2 oz (103.5 kg), SpO2 98%.    HEENT: No thrush Resp: Lungs with decreased breath sounds and rhonchi at the extreme posterior base bilaterally, no respiratory distress Cardio: Regular rate and rhythm GI: No hepatosplenomegaly Vascular: No leg edema  Skin: Raised pink approximately 5 cm cutaneous mass of the left buttock with a larger area of surrounding hyperpigmentation-the subcutaneous induration appears diminished, bilobed mass at the left anterior thigh measures approximately 2.5 cm, the left arm hyperpigmented lesion has a 1.5 cm central raise area with approximately 2 cm area of surrounding hyperpigmentation  Lab Results:  Lab Results  Component Value Date   WBC 8.5 05/11/2023   HGB 12.9 05/11/2023   HCT 39.3 05/11/2023   MCV 88.7 05/11/2023   PLT 229 05/11/2023   NEUTROABS 5.9 05/11/2023    CMP  Lab Results  Component Value Date   NA 139 05/04/2023   K 3.9 05/04/2023   CL 101 05/04/2023   CO2 26 05/04/2023   GLUCOSE 236 (H) 05/04/2023   BUN 22 05/04/2023   CREATININE 1.31 (H) 05/04/2023   CALCIUM 9.8 05/04/2023   PROT 7.3 05/04/2023   ALBUMIN 4.5 05/04/2023   AST 11 (L) 05/04/2023   ALT 12 05/04/2023   ALKPHOS 92 05/04/2023   BILITOT 0.4 05/04/2023   GFRNONAA 44 (L) 05/04/2023     Medications: I have reviewed the patient's current medications.   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 PET October 2013-numerous FDG avid subdermal/subcutaneous nodules and bone lesions Prednisone October 2013 with initial response, tapered until February 2014 when skin lesions enlarged Rituximab 4 weekly doses beginning, followed by rituximab maintenance with completion of 2 years January 2016 Disease progression July 2019-no response to rituximab or Gleevec Revlimid/Decadron August 2019-partial response 01/28/2022-enlarging cutaneous lesion left anterior thigh, possible small similar lesions at the left abdomen and left lower leg PET 03/11/2022-heterogenous mildly enlarged right thyroid with calcification, cutaneous left anterior thigh lesion with moderate metabolic activity, cutaneous lesion at the mid anterior chest-nonspecific, left upper lobe pulmonary nodule with low-level FDG activity less than mediastinal blood pool-nonspecific Thalidomide beginning 09/13/2022 Thalidomide discontinued 12/06/2022 secondary to apparent toxicities (malaise, headaches,1 weight gain (and no regression of the cutaneous lesions) PET scan 01/21/2023-intense hypermetabolic subcutaneous nodular thickening in the posterior left buttock region; similar metabolic cutaneous lesion anterior left thigh; intense metabolic activity lateral to the right lateral femoral condyle; intense radiotracer activity in the dorsum of the left foot superficial to the proximal third phalanx 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  Stable left upper lobe pulmonary nodule with very low metabolic activity. Methotrexate, 10 mg weekly, beginning 02/07/2023, last dose 04/04/2023 Methotrexate discontinued secondary to development of a new lesion at the left upper arm and lack of a Cycle 1 weekly Rituxan 04/20/2023 Cycle 2 weekly Rituxan 04/27/2023 Cycle 3 weekly rituximab 05/04/2023 Cycle 4 weekly rituximab 05/12/2023 Diabetes Hypertension Hyperlipidemia Asthma/seasonal allergies Left  lung nodule on PET  03/11/2022 CT chest 07/12/2022-unchanged left upper lobe nodule, no new suspicious nodules or masses heterogenous enlargement of the thyroid 7.  Multinodular thyroid-2 right-sided nodules meet criteria for biopsy and a calcified nodule in the right mid thyroid meets criteria for surveillance imaging 8.  L5-S1 decompressive laminotomies 08/27/2022      Disposition: Shari Hill has completed 3 treatments with rituximab.  She has tolerated the treatment well.  She will complete week #4 today.  She is scheduled to follow-up with Dr. Leotis Pain 05/17/2023.  She will return for an office visit in 1 month.  We will decide on continuing rituximab, a treatment break, or switching to a different systemic therapy based on recommendations from Dr. Leotis Pain.  Shari Hill will receive an influenza vaccine today.  Thornton Papas, MD  05/11/2023  11:36 AM

## 2023-05-12 ENCOUNTER — Other Ambulatory Visit: Payer: Self-pay

## 2023-05-17 ENCOUNTER — Encounter: Payer: Self-pay | Admitting: Oncology

## 2023-06-05 ENCOUNTER — Encounter: Payer: Self-pay | Admitting: Oncology

## 2023-06-07 NOTE — Telephone Encounter (Signed)
TC

## 2023-06-09 ENCOUNTER — Inpatient Hospital Stay: Payer: Medicare Other | Attending: Oncology | Admitting: Nurse Practitioner

## 2023-06-09 ENCOUNTER — Encounter: Payer: Self-pay | Admitting: Nurse Practitioner

## 2023-06-09 VITALS — BP 118/69 | HR 83 | Temp 98.2°F | Resp 18 | Ht 63.0 in | Wt 231.5 lb

## 2023-06-09 DIAGNOSIS — J45909 Unspecified asthma, uncomplicated: Secondary | ICD-10-CM | POA: Insufficient documentation

## 2023-06-09 DIAGNOSIS — E785 Hyperlipidemia, unspecified: Secondary | ICD-10-CM | POA: Insufficient documentation

## 2023-06-09 DIAGNOSIS — D763 Other histiocytosis syndromes: Secondary | ICD-10-CM | POA: Insufficient documentation

## 2023-06-09 DIAGNOSIS — I10 Essential (primary) hypertension: Secondary | ICD-10-CM | POA: Insufficient documentation

## 2023-06-09 DIAGNOSIS — E119 Type 2 diabetes mellitus without complications: Secondary | ICD-10-CM | POA: Diagnosis not present

## 2023-06-09 DIAGNOSIS — R911 Solitary pulmonary nodule: Secondary | ICD-10-CM | POA: Diagnosis not present

## 2023-06-09 NOTE — Progress Notes (Signed)
[facility] OFFICE PROGRESS NOTE   Diagnosis: Rosai-Dorfman syndrome  INTERVAL HISTORY:   Ms. Vanderstelt returns as scheduled.  She completed a fourth cycle of weekly Rituxan 05/11/2023 . She had a telehealth visit with Dr. Leotis Pain 05/17/2023.  She will be seen in person 06/17/2023.  She denies signs of allergic reaction with the Rituxan.  During the entire course of Rituxan she noted some "bumps" on her feet that are now resolving.  She feels the left buttock lesion is smaller and changing color, left arm lesion is smaller and the left thigh lesion has been the slowest to improve.  In general she notes blood sugars have been running higher since she began Rituxan.  Objective:  Vital signs in last 24 hours:  Blood pressure 118/69, pulse 83, temperature 98.2 F (36.8 C), temperature source Temporal, resp. rate 18, height 5\' 3"  (1.6 m), weight 231 lb 8 oz (105 kg), SpO2 99%.    HEENT: No thrush. Resp: Lungs clear bilaterally. Cardio: Regular rate and rhythm. GI: No hepatosplenomegaly. Vascular: No leg edema. Skin: Lesion at the left buttock is encompassing a smaller area and seems to be drying; mass at the left anterior thigh appears similar in size; hyperpigmented lesion at the left arm is no longer raised.   Lab Results:  Lab Results  Component Value Date   WBC 8.5 05/11/2023   HGB 12.9 05/11/2023   HCT 39.3 05/11/2023   MCV 88.7 05/11/2023   PLT 229 05/11/2023   NEUTROABS 5.9 05/11/2023    Imaging:  No results found.  Medications: I have reviewed the patient's current medications.  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 PET October 2013-numerous FDG avid subdermal/subcutaneous nodules and bone lesions Prednisone October 2013 with initial response, tapered until February 2014 when skin lesions enlarged Rituximab 4 weekly doses beginning, followed by rituximab maintenance with completion of 2 years  January 2016 Disease progression July 2019-no response to rituximab or Gleevec Revlimid/Decadron August 2019-partial response 01/28/2022-enlarging cutaneous lesion left anterior thigh, possible small similar lesions at the left abdomen and left lower leg PET 03/11/2022-heterogenous mildly enlarged right thyroid with calcification, cutaneous left anterior thigh lesion with moderate metabolic activity, cutaneous lesion at the mid anterior chest-nonspecific, left upper lobe pulmonary nodule with low-level FDG activity less than mediastinal blood pool-nonspecific Thalidomide beginning 09/13/2022 Thalidomide discontinued 12/06/2022 secondary to apparent toxicities (malaise, headaches,1 weight gain (and no regression of the cutaneous lesions) PET scan 01/21/2023-intense hypermetabolic subcutaneous nodular thickening in the posterior left buttock region; similar metabolic cutaneous lesion anterior left thigh; intense metabolic activity lateral to the right lateral femoral condyle; intense radiotracer activity in the dorsum of the left foot superficial to the proximal third phalanx 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  Stable left upper lobe pulmonary nodule with very low metabolic activity. Methotrexate, 10 mg weekly, beginning 02/07/2023, last dose 04/04/2023 Methotrexate discontinued secondary to development of a new lesion at the left upper arm and lack of a Cycle 1 weekly Rituxan 04/20/2023 Cycle 2 weekly Rituxan 04/27/2023 Cycle 3 weekly rituximab 05/04/2023 Cycle 4 weekly rituximab 05/12/2023 Diabetes Hypertension Hyperlipidemia Asthma/seasonal allergies Left lung nodule on PET 03/11/2022 CT chest 07/12/2022-unchanged left upper lobe nodule, no new suspicious nodules or masses heterogenous enlargement of the thyroid 7.  Multinodular thyroid-2 right-sided nodules meet criteria for biopsy and a calcified nodule in the right mid thyroid meets criteria for surveillance imaging 8.  L5-S1 decompressive  laminotomies 08/27/2022  Disposition: Ms. Kangas appears stable.  She completed  the fourth weekly rituximab about a month ago.  The skin lesions appear to be improving.  She has a follow-up appointment with Dr. Leotis Pain in the near future to discuss plans going forward.  She will return for follow-up here in 4 to 6 weeks, sooner pending Dr. Zenaida Niece Deventer's recommendation.  Patient seen with Dr. Truett Perna.  Lonna Cobb ANP/GNP-BC   06/09/2023  11:47 AM   This was a shared visit with Lonna Cobb.  [place] was interviewed and examined.  She has completed 4 weeks of rituximab.  The palpable Rosai-Dorfman lesions have improved.  She will follow-up at [facility] next week for recommendations regarding continuing rituximab versus observation.  I was present for greater than 50% of today's visit.  I performed medical decision making.  Mancel Bale, MD

## 2023-06-10 ENCOUNTER — Other Ambulatory Visit: Payer: Self-pay

## 2023-07-14 ENCOUNTER — Other Ambulatory Visit: Payer: Self-pay

## 2023-07-26 ENCOUNTER — Inpatient Hospital Stay: Payer: Medicare Other | Attending: Oncology | Admitting: Oncology

## 2023-07-26 VITALS — BP 121/72 | HR 89 | Temp 97.9°F | Resp 18 | Ht 63.0 in | Wt 226.8 lb

## 2023-07-26 DIAGNOSIS — J45909 Unspecified asthma, uncomplicated: Secondary | ICD-10-CM | POA: Diagnosis not present

## 2023-07-26 DIAGNOSIS — E785 Hyperlipidemia, unspecified: Secondary | ICD-10-CM | POA: Diagnosis not present

## 2023-07-26 DIAGNOSIS — E119 Type 2 diabetes mellitus without complications: Secondary | ICD-10-CM | POA: Diagnosis not present

## 2023-07-26 DIAGNOSIS — D763 Other histiocytosis syndromes: Secondary | ICD-10-CM | POA: Diagnosis not present

## 2023-07-26 DIAGNOSIS — R911 Solitary pulmonary nodule: Secondary | ICD-10-CM | POA: Diagnosis not present

## 2023-07-26 DIAGNOSIS — E041 Nontoxic single thyroid nodule: Secondary | ICD-10-CM | POA: Insufficient documentation

## 2023-07-26 DIAGNOSIS — I10 Essential (primary) hypertension: Secondary | ICD-10-CM | POA: Insufficient documentation

## 2023-07-26 NOTE — Progress Notes (Signed)
[facility] OFFICE PROGRESS NOTE   Diagnosis: Rosai-Dorfman disease  INTERVAL HISTORY:   Shari Hill returns as scheduled.  She reports a recent sinus infection.  The left buttock lesion has almost completely resolved.  The left arm and left thigh lesions are improved.  No new lesions.  No fever or night sweats.  Good appetite. She received a steroid injection at the back last week. Objective:  Vital signs in last 24 hours:  Blood pressure 121/72, pulse 89, temperature 97.9 F (36.6 C), temperature source Temporal, resp. rate 18, height 5\' 3"  (1.6 m), weight 226 lb 12.8 oz (102.9 kg), SpO2 99%.    Lymphatics: No cervical, supraclavicular, axillary, or inguinal nodes Resp: Lungs clear bilaterally Cardio: Regular rate and rhythm GI: No hepatosplenomegaly Vascular: No leg edema  Skin: 2 cm bilobed lesion at the left upper thigh is soft and appears flatter, 2.5 cm lesion at the left arm is flat, 4-5 cm hyperpigmented lesion at the left buttock is almost completely flat   Lab Results:  Lab Results  Component Value Date   WBC 8.5 05/11/2023   HGB 12.9 05/11/2023   HCT 39.3 05/11/2023   MCV 88.7 05/11/2023   PLT 229 05/11/2023   NEUTROABS 5.9 05/11/2023    CMP  Lab Results  Component Value Date   NA 140 05/11/2023   K 3.8 05/11/2023   CL 102 05/11/2023   CO2 26 05/11/2023   GLUCOSE 254 (H) 05/11/2023   BUN 22 05/11/2023   CREATININE 1.10 (H) 05/11/2023   CALCIUM 9.7 05/11/2023   PROT 7.1 05/11/2023   ALBUMIN 4.2 05/11/2023   AST 11 (L) 05/11/2023   ALT 9 05/11/2023   ALKPHOS 90 05/11/2023   BILITOT 0.3 05/11/2023   GFRNONAA 55 (L) 05/11/2023    No results found for: "CEA1", "CEA", "OZH086", "CA125"  No results found for: "INR", "LABPROT"  Imaging:  No results found.  Medications: I have reviewed the patient's current medications.   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 PET October  2013-numerous FDG avid subdermal/subcutaneous nodules and bone lesions Prednisone October 2013 with initial response, tapered until February 2014 when skin lesions enlarged Rituximab 4 weekly doses beginning, followed by rituximab maintenance with completion of 2 years January 2016 Disease progression July 2019-no response to rituximab or Gleevec Revlimid/Decadron August 2019-partial response 01/28/2022-enlarging cutaneous lesion left anterior thigh, possible small similar lesions at the left abdomen and left lower leg PET 03/11/2022-heterogenous mildly enlarged right thyroid with calcification, cutaneous left anterior thigh lesion with moderate metabolic activity, cutaneous lesion at the mid anterior chest-nonspecific, left upper lobe pulmonary nodule with low-level FDG activity less than mediastinal blood pool-nonspecific Thalidomide beginning 09/13/2022 Thalidomide discontinued 12/06/2022 secondary to apparent toxicities (malaise, headaches,1 weight gain (and no regression of the cutaneous lesions) PET scan 01/21/2023-intense hypermetabolic subcutaneous nodular thickening in the posterior left buttock region; similar metabolic cutaneous lesion anterior left thigh; intense metabolic activity lateral to the right lateral femoral condyle; intense radiotracer activity in the dorsum of the left foot superficial to the proximal third phalanx 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  Stable left upper lobe pulmonary nodule with very low metabolic activity. Methotrexate, 10 mg weekly, beginning 02/07/2023, last dose 04/04/2023 Methotrexate discontinued secondary to development of a new lesion at the left upper arm and lack of a Cycle 1 weekly Rituxan 04/20/2023 Cycle 2 weekly Rituxan 04/27/2023 Cycle 3 weekly rituximab 05/04/2023 Cycle 4 weekly rituximab 05/12/2023 Diabetes Hypertension Hyperlipidemia Asthma/seasonal allergies Left lung nodule  on PET 03/11/2022 CT chest 07/12/2022-unchanged left upper lobe nodule,  no new suspicious nodules or masses heterogenous enlargement of the thyroid 7.  Multinodular thyroid-2 right-sided nodules meet criteria for biopsy and a calcified nodule in the right mid thyroid meets criteria for surveillance imaging 8.  L5-S1 decompressive laminotomies 1/5   Disposition: Shari Hill completed 4 weeks of rituximab.  The palpable Rosai-Dorfman lesions appear improved.  The dominant lesion at the left buttock has almost completely resolved and the other lesions appear to be smaller.  She tolerated the rituximab well.  Dr. Leotis Pain recommends rituximab maintenance every 3 months.  She is scheduled for follow-up at [facility] 08/31/2023.  She will return for an office visit here with the plan to begin rituximab maintenance on 09/06/2023.  Thornton Papas, MD  07/26/2023  9:22 AM

## 2023-07-27 ENCOUNTER — Other Ambulatory Visit: Payer: Self-pay

## 2023-07-27 ENCOUNTER — Ambulatory Visit (INDEPENDENT_AMBULATORY_CARE_PROVIDER_SITE_OTHER): Payer: Medicare Other | Admitting: Podiatry

## 2023-07-27 DIAGNOSIS — B353 Tinea pedis: Secondary | ICD-10-CM

## 2023-07-27 MED ORDER — TERBINAFINE HCL 250 MG PO TABS: 250.0000 mg | ORAL_TABLET | Freq: Every day | ORAL | 0 refills | Status: DC

## 2023-07-27 MED ORDER — CLOTRIMAZOLE-BETAMETHASONE 1-0.05 % EX CREA: 1.0000 | TOPICAL_CREAM | Freq: Every day | CUTANEOUS | 2 refills | Status: DC

## 2023-07-27 NOTE — Progress Notes (Signed)
   No chief complaint on file.   HPI: 67 y.o. female presenting today for evaluation of peeling of skin to the weightbearing surfaces of the bilateral feet.  Recent onset over the past month.  Currently she has not done anything for treatment  Past Medical History:  Diagnosis Date   Asthma    Diabetes mellitus without complication (HCC)    Difficult intubation 05/19/2011   "Unsuccessfully used Mac 3, Glidescope 4, Glydescope 3. Successfully used Fiberoptic scope"   Hyperlipidemia    Hypertension    Multinodular thyroid    Nodule of left lung    PONV (postoperative nausea and vomiting)    Rosai-Dorfman disease (HCC)    Sleep apnea     Past Surgical History:  Procedure Laterality Date   APPENDECTOMY     CHOLECYSTECTOMY     EXPLORATORY LAPAROTOMY     MEDIAL PARTIAL KNEE REPLACEMENT  09/05/2020   NASAL SINUS SURGERY  2005   ROTATOR CUFF REPAIR Left     Allergies  Allergen Reactions   Zocor [Simvastatin] Other (See Comments)    Unknown reaction per pt    Latex Hives and Rash   Other Hives, Itching, Swelling and Rash    Dissolvable stiches     B/L feet 07/27/2023   Physical Exam: General: The patient is alert and oriented x3 in no acute distress.  Dermatology: Skin is warm, dry and supple bilateral lower extremities.  Diffuse hyperkeratosis of skin with peeling noted consistent with tinea pedis of the feet  Vascular: Palpable pedal pulses bilaterally. Capillary refill within normal limits.  No appreciable edema.  No erythema.  Neurological: Grossly intact via light touch  Musculoskeletal Exam: No pedal deformities noted  Assessment/Plan of Care: 1.  Tinea pedis weightbearing surface of the bilateral feet  -Patient evaluated -Prescription for Lotrisone cream apply twice daily -Prescription for Lamisil 250 mg #30 daily.  Recent CMP hepatic function WNL -Return to clinic 4 weeks      Felecia Shelling, DPM [facility]  Dr. Felecia Shelling, DPM     [address] [place], [place] [address]                Office [phone]  Fax [phone]

## 2023-08-26 ENCOUNTER — Other Ambulatory Visit: Payer: Self-pay

## 2023-09-02 ENCOUNTER — Other Ambulatory Visit: Payer: Self-pay | Admitting: Neurosurgery

## 2023-09-02 DIAGNOSIS — M4316 Spondylolisthesis, lumbar region: Secondary | ICD-10-CM

## 2023-09-08 ENCOUNTER — Other Ambulatory Visit: Payer: Medicare Other

## 2023-09-08 ENCOUNTER — Ambulatory Visit: Payer: Medicare Other | Admitting: Oncology

## 2023-09-11 ENCOUNTER — Other Ambulatory Visit: Payer: Self-pay | Admitting: Oncology

## 2023-09-13 ENCOUNTER — Inpatient Hospital Stay (HOSPITAL_BASED_OUTPATIENT_CLINIC_OR_DEPARTMENT_OTHER): Payer: Medicare Other | Admitting: Oncology

## 2023-09-13 ENCOUNTER — Inpatient Hospital Stay: Payer: Medicare Other | Attending: Oncology

## 2023-09-13 VITALS — BP 105/66 | HR 74 | Temp 98.1°F | Resp 18 | Ht 63.0 in | Wt 224.0 lb

## 2023-09-13 DIAGNOSIS — D763 Other histiocytosis syndromes: Secondary | ICD-10-CM | POA: Insufficient documentation

## 2023-09-13 DIAGNOSIS — E119 Type 2 diabetes mellitus without complications: Secondary | ICD-10-CM | POA: Insufficient documentation

## 2023-09-13 DIAGNOSIS — I10 Essential (primary) hypertension: Secondary | ICD-10-CM | POA: Insufficient documentation

## 2023-09-13 DIAGNOSIS — R911 Solitary pulmonary nodule: Secondary | ICD-10-CM | POA: Diagnosis not present

## 2023-09-13 DIAGNOSIS — J45909 Unspecified asthma, uncomplicated: Secondary | ICD-10-CM | POA: Diagnosis not present

## 2023-09-13 DIAGNOSIS — C8590 Non-Hodgkin lymphoma, unspecified, unspecified site: Secondary | ICD-10-CM

## 2023-09-13 DIAGNOSIS — E785 Hyperlipidemia, unspecified: Secondary | ICD-10-CM | POA: Diagnosis not present

## 2023-09-13 DIAGNOSIS — Z79899 Other long term (current) drug therapy: Secondary | ICD-10-CM | POA: Insufficient documentation

## 2023-09-13 LAB — CBC WITH DIFFERENTIAL (CANCER CENTER ONLY)
Abs Immature Granulocytes: 0.03 10*3/uL (ref 0.00–0.07)
Basophils Absolute: 0 10*3/uL (ref 0.0–0.1)
Basophils Relative: 1 %
Eosinophils Absolute: 0.3 10*3/uL (ref 0.0–0.5)
Eosinophils Relative: 3 %
HCT: 41.7 % (ref 36.0–46.0)
Hemoglobin: 13.5 g/dL (ref 12.0–15.0)
Immature Granulocytes: 0 %
Lymphocytes Relative: 25 %
Lymphs Abs: 2.2 10*3/uL (ref 0.7–4.0)
MCH: 26.8 pg (ref 26.0–34.0)
MCHC: 32.4 g/dL (ref 30.0–36.0)
MCV: 82.7 fL (ref 80.0–100.0)
Monocytes Absolute: 0.8 10*3/uL (ref 0.1–1.0)
Monocytes Relative: 9 %
Neutro Abs: 5.5 10*3/uL (ref 1.7–7.7)
Neutrophils Relative %: 62 %
Platelet Count: 212 10*3/uL (ref 150–400)
RBC: 5.04 MIL/uL (ref 3.87–5.11)
RDW: 17.2 % — ABNORMAL HIGH (ref 11.5–15.5)
WBC Count: 8.8 10*3/uL (ref 4.0–10.5)
nRBC: 0 % (ref 0.0–0.2)

## 2023-09-13 LAB — CMP (CANCER CENTER ONLY)
ALT: 10 U/L (ref 0–44)
AST: 14 U/L — ABNORMAL LOW (ref 15–41)
Albumin: 4.4 g/dL (ref 3.5–5.0)
Alkaline Phosphatase: 93 U/L (ref 38–126)
Anion gap: 9 (ref 5–15)
BUN: 16 mg/dL (ref 8–23)
CO2: 30 mmol/L (ref 22–32)
Calcium: 9.6 mg/dL (ref 8.9–10.3)
Chloride: 102 mmol/L (ref 98–111)
Creatinine: 1.14 mg/dL — ABNORMAL HIGH (ref 0.44–1.00)
GFR, Estimated: 52 mL/min — ABNORMAL LOW (ref >60)
Glucose, Bld: 169 mg/dL — ABNORMAL HIGH (ref 70–99)
Potassium: 3.5 mmol/L (ref 3.5–5.1)
Sodium: 141 mmol/L (ref 135–145)
Total Bilirubin: 0.4 mg/dL (ref 0.0–1.2)
Total Protein: 6.8 g/dL (ref 6.5–8.1)

## 2023-09-13 NOTE — Progress Notes (Signed)
[facility] OFFICE PROGRESS NOTE   Diagnosis: Rosai-Dorfman disease  INTERVAL HISTORY:   Ms. Waterhouse returns as scheduled.  She continues to have back pain.  She reports a recent sinus infection.  She was seen at [facility] 08/30/2022.  Dr. Zenaida Niece deventer recommends a restaging PET and continuation of rituximab.  She reports the left arm, left thigh, left buttock lesions continue to improve.  Objective:  Vital signs in last 24 hours:  Blood pressure 105/66, pulse 74, temperature 98.1 F (36.7 C), temperature source Temporal, resp. rate 18, height 5\' 3"  (1.6 m), weight 224 lb (101.6 kg), SpO2 98%.   Lymphatics: No cervical, supraclavicular, axillary, or inguinal nodes Resp: Lungs clear bilaterally Cardio: Regular rate and rhythm GI: No hepatosplenomegaly Vascular: No leg edema  Skin: 4-5 cm brown circular lesion at the left buttock is slightly raised, 2 cm lesion at the left upper arm is flat, slightly nodular 1.5 cm lesion at the left anterior thigh  Lab Results:  Lab Results  Component Value Date   WBC 8.8 09/13/2023   HGB 13.5 09/13/2023   HCT 41.7 09/13/2023   MCV 82.7 09/13/2023   PLT 212 09/13/2023   NEUTROABS 5.5 09/13/2023    CMP  Lab Results  Component Value Date   NA 141 09/13/2023   K 3.5 09/13/2023   CL 102 09/13/2023   CO2 30 09/13/2023   GLUCOSE 169 (H) 09/13/2023   BUN 16 09/13/2023   CREATININE 1.14 (H) 09/13/2023   CALCIUM 9.6 09/13/2023   PROT 6.8 09/13/2023   ALBUMIN 4.4 09/13/2023   AST 14 (L) 09/13/2023   ALT 10 09/13/2023   ALKPHOS 93 09/13/2023   BILITOT 0.4 09/13/2023   GFRNONAA 52 (L) 09/13/2023    No results found for: "CEA1", "CEA", "HQI696", "CA125"  No results found for: "INR", "LABPROT"  Imaging:  No results found.  Medications: I have reviewed the patient's current medications.   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 PET October 2013-numerous FDG avid  subdermal/subcutaneous nodules and bone lesions Prednisone October 2013 with initial response, tapered until February 2014 when skin lesions enlarged Rituximab 4 weekly doses beginning, followed by rituximab maintenance with completion of 2 years January 2016 Disease progression July 2019-no response to rituximab or Gleevec Revlimid/Decadron August 2019-partial response 01/28/2022-enlarging cutaneous lesion left anterior thigh, possible small similar lesions at the left abdomen and left lower leg PET 03/11/2022-heterogenous mildly enlarged right thyroid with calcification, cutaneous left anterior thigh lesion with moderate metabolic activity, cutaneous lesion at the mid anterior chest-nonspecific, left upper lobe pulmonary nodule with low-level FDG activity less than mediastinal blood pool-nonspecific Thalidomide beginning 09/13/2022 Thalidomide discontinued 12/06/2022 secondary to apparent toxicities (malaise, headaches,1 weight gain (and no regression of the cutaneous lesions) PET scan 01/21/2023-intense hypermetabolic subcutaneous nodular thickening in the posterior left buttock region; similar metabolic cutaneous lesion anterior left thigh; intense metabolic activity lateral to the right lateral femoral condyle; intense radiotracer activity in the dorsum of the left foot superficial to the proximal third phalanx 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  Stable left upper lobe pulmonary nodule with very low metabolic activity. Methotrexate, 10 mg weekly, beginning 02/07/2023, last dose 04/04/2023 Methotrexate discontinued secondary to development of a new lesion at the left upper arm and lack of a Cycle 1 weekly Rituxan 04/20/2023 Cycle 2 weekly Rituxan 04/27/2023 Cycle 3 weekly rituximab 05/04/2023 Cycle 4 weekly rituximab 05/12/2023 Diabetes Hypertension Hyperlipidemia Asthma/seasonal allergies Left lung nodule on PET 03/11/2022 CT chest 07/12/2022-unchanged left upper  lobe nodule, no new suspicious  nodules or masses heterogenous enlargement of the thyroid 7.  Multinodular thyroid-2 right-sided nodules meet criteria for biopsy and a calcified nodule in the right mid thyroid meets criteria for surveillance imaging 8.  L5-S1 decompressive laminotomies 1/5     Disposition: Ms. Allton was last treated with rituximab in September.  She reports feeling cold during the rituximab infusion.  Skin lesions appear to be fading.  Dr. Gar Gibbon recommends a restaging PET with the plan to continue rituximab if the PET is consistent with a clinical response.  She will be scheduled for a whole-body PET scan within the next few weeks.  She will return for an office visit with the plan to continue rituximab on 10/05/2023.  Thornton Papas, MD  09/13/2023  10:05 AM

## 2023-09-15 ENCOUNTER — Other Ambulatory Visit: Payer: Self-pay

## 2023-09-26 ENCOUNTER — Encounter (HOSPITAL_COMMUNITY)
Admission: RE | Admit: 2023-09-26 | Discharge: 2023-09-26 | Disposition: A | Discharge status: Home / Self Care | Payer: Medicare Other | Source: Ambulatory Visit | Attending: Oncology | Admitting: Oncology

## 2023-09-26 ENCOUNTER — Encounter: Payer: Self-pay | Admitting: Oncology

## 2023-09-26 DIAGNOSIS — R911 Solitary pulmonary nodule: Secondary | ICD-10-CM | POA: Insufficient documentation

## 2023-09-26 DIAGNOSIS — R590 Localized enlarged lymph nodes: Secondary | ICD-10-CM | POA: Insufficient documentation

## 2023-09-26 DIAGNOSIS — C8590 Non-Hodgkin lymphoma, unspecified, unspecified site: Secondary | ICD-10-CM | POA: Diagnosis present

## 2023-09-26 DIAGNOSIS — D763 Other histiocytosis syndromes: Secondary | ICD-10-CM | POA: Insufficient documentation

## 2023-09-26 DIAGNOSIS — I700 Atherosclerosis of aorta: Secondary | ICD-10-CM | POA: Diagnosis not present

## 2023-09-26 LAB — GLUCOSE, CAPILLARY: Glucose-Capillary: 161 mg/dL — ABNORMAL HIGH (ref 70–99)

## 2023-09-26 MED ORDER — FLUDEOXYGLUCOSE F - 18 (FDG) INJECTION
11.2000 | Freq: Once | INTRAVENOUS | Status: AC
  Administered 2023-09-26: 11.09 via INTRAVENOUS

## 2023-09-30 ENCOUNTER — Encounter: Payer: Self-pay | Admitting: Oncology

## 2023-10-04 ENCOUNTER — Other Ambulatory Visit: Payer: Self-pay

## 2023-10-05 ENCOUNTER — Encounter: Payer: Self-pay | Admitting: Nurse Practitioner

## 2023-10-05 ENCOUNTER — Inpatient Hospital Stay: Payer: Medicare Other | Attending: Oncology

## 2023-10-05 ENCOUNTER — Inpatient Hospital Stay: Payer: Medicare Other

## 2023-10-05 ENCOUNTER — Inpatient Hospital Stay (HOSPITAL_BASED_OUTPATIENT_CLINIC_OR_DEPARTMENT_OTHER): Payer: Medicare Other | Admitting: Nurse Practitioner

## 2023-10-05 VITALS — BP 106/72 | HR 75 | Temp 98.2°F | Resp 18 | Ht 63.0 in | Wt 223.9 lb

## 2023-10-05 VITALS — BP 90/53 | HR 69 | Temp 98.3°F | Resp 18

## 2023-10-05 DIAGNOSIS — L989 Disorder of the skin and subcutaneous tissue, unspecified: Secondary | ICD-10-CM | POA: Diagnosis not present

## 2023-10-05 DIAGNOSIS — D763 Other histiocytosis syndromes: Secondary | ICD-10-CM | POA: Diagnosis present

## 2023-10-05 DIAGNOSIS — R911 Solitary pulmonary nodule: Secondary | ICD-10-CM | POA: Insufficient documentation

## 2023-10-05 DIAGNOSIS — J45909 Unspecified asthma, uncomplicated: Secondary | ICD-10-CM | POA: Diagnosis not present

## 2023-10-05 DIAGNOSIS — Z5112 Encounter for antineoplastic immunotherapy: Secondary | ICD-10-CM | POA: Insufficient documentation

## 2023-10-05 DIAGNOSIS — E119 Type 2 diabetes mellitus without complications: Secondary | ICD-10-CM | POA: Diagnosis not present

## 2023-10-05 DIAGNOSIS — E785 Hyperlipidemia, unspecified: Secondary | ICD-10-CM | POA: Insufficient documentation

## 2023-10-05 DIAGNOSIS — I10 Essential (primary) hypertension: Secondary | ICD-10-CM | POA: Insufficient documentation

## 2023-10-05 DIAGNOSIS — Z792 Long term (current) use of antibiotics: Secondary | ICD-10-CM | POA: Insufficient documentation

## 2023-10-05 LAB — CBC WITH DIFFERENTIAL (CANCER CENTER ONLY)
Abs Immature Granulocytes: 0.02 10*3/uL (ref 0.00–0.07)
Basophils Absolute: 0.1 10*3/uL (ref 0.0–0.1)
Basophils Relative: 1 %
Eosinophils Absolute: 0.2 10*3/uL (ref 0.0–0.5)
Eosinophils Relative: 3 %
HCT: 40.5 % (ref 36.0–46.0)
Hemoglobin: 13 g/dL (ref 12.0–15.0)
Immature Granulocytes: 0 %
Lymphocytes Relative: 25 %
Lymphs Abs: 2 10*3/uL (ref 0.7–4.0)
MCH: 26.7 pg (ref 26.0–34.0)
MCHC: 32.1 g/dL (ref 30.0–36.0)
MCV: 83.2 fL (ref 80.0–100.0)
Monocytes Absolute: 0.5 10*3/uL (ref 0.1–1.0)
Monocytes Relative: 7 %
Neutro Abs: 5.3 10*3/uL (ref 1.7–7.7)
Neutrophils Relative %: 64 %
Platelet Count: 209 10*3/uL (ref 150–400)
RBC: 4.87 MIL/uL (ref 3.87–5.11)
RDW: 17.1 % — ABNORMAL HIGH (ref 11.5–15.5)
WBC Count: 8.1 10*3/uL (ref 4.0–10.5)
nRBC: 0 % (ref 0.0–0.2)

## 2023-10-05 LAB — CMP (CANCER CENTER ONLY)
ALT: 10 U/L (ref 0–44)
AST: 13 U/L — ABNORMAL LOW (ref 15–41)
Albumin: 3.9 g/dL (ref 3.5–5.0)
Alkaline Phosphatase: 90 U/L (ref 38–126)
Anion gap: 12 (ref 5–15)
BUN: 17 mg/dL (ref 8–23)
CO2: 25 mmol/L (ref 22–32)
Calcium: 9.1 mg/dL (ref 8.9–10.3)
Chloride: 104 mmol/L (ref 98–111)
Creatinine: 1.11 mg/dL — ABNORMAL HIGH (ref 0.44–1.00)
GFR, Estimated: 54 mL/min — ABNORMAL LOW (ref >60)
Glucose, Bld: 269 mg/dL — ABNORMAL HIGH (ref 70–99)
Potassium: 3.5 mmol/L (ref 3.5–5.1)
Sodium: 141 mmol/L (ref 135–145)
Total Bilirubin: 0.4 mg/dL (ref 0.0–1.2)
Total Protein: 6.7 g/dL (ref 6.5–8.1)

## 2023-10-05 MED ORDER — SODIUM CHLORIDE 0.9 % IV SOLN
375.0000 mg/m2 | Freq: Once | INTRAVENOUS | Status: AC
  Administered 2023-10-05: 800 mg via INTRAVENOUS
  Filled 2023-10-05: qty 50

## 2023-10-05 MED ORDER — ACETAMINOPHEN 325 MG PO TABS
650.0000 mg | ORAL_TABLET | Freq: Once | ORAL | Status: AC
  Administered 2023-10-05: 650 mg via ORAL
  Filled 2023-10-05: qty 2

## 2023-10-05 MED ORDER — PROCHLORPERAZINE MALEATE 10 MG PO TABS
10.0000 mg | ORAL_TABLET | Freq: Four times a day (QID) | ORAL | Status: DC | PRN
  Administered 2023-10-05: 10 mg via ORAL
  Filled 2023-10-05: qty 1

## 2023-10-05 MED ORDER — FAMOTIDINE IN NACL 20-0.9 MG/50ML-% IV SOLN
20.0000 mg | Freq: Once | INTRAVENOUS | Status: AC
  Administered 2023-10-05: 20 mg via INTRAVENOUS
  Filled 2023-10-05: qty 50

## 2023-10-05 MED ORDER — SODIUM CHLORIDE 0.9 % IV SOLN: Freq: Once | INTRAVENOUS | Status: AC

## 2023-10-05 MED ORDER — DIPHENHYDRAMINE HCL 25 MG PO CAPS
50.0000 mg | ORAL_CAPSULE | Freq: Once | ORAL | Status: AC
  Administered 2023-10-05: 50 mg via ORAL
  Filled 2023-10-05: qty 2

## 2023-10-05 NOTE — Progress Notes (Signed)
[facility] OFFICE PROGRESS NOTE   Diagnosis: Rosai-Dorfman disease  INTERVAL HISTORY:   Shari Hill returns as scheduled.  Overall feels well.  Reports several sinus infections.  She is currently on Augmentin.  No fever.  Energy level is "okay".  She reports a good appetite.  Skin lesions are stable.  No new lesions.  Objective:  Vital signs in last 24 hours:  Blood pressure 106/72, pulse 75, temperature 98.2 F (36.8 C), temperature source Temporal, resp. rate 18, height 5\' 3"  (1.6 m), weight 223 lb 14.4 oz (101.6 kg), SpO2 97%.    HEENT: No thrush or ulcers. Lymphatics: No palpable cervical, supraclavicular or axillary lymph nodes. Resp: Lungs clear bilaterally. Cardio: Regular rate and rhythm. GI: No hepatosplenomegaly. Vascular: No leg edema. Skin: Approximate 5 cm brown circular dry appearing lesion at the left buttock.  Approximate 3 cm hyperpigmented lesion left upper outer arm.  Slightly nodular 2- 2.5 cm lesion at the left anterior thigh.   Lab Results:  Lab Results  Component Value Date   WBC 8.8 09/13/2023   HGB 13.5 09/13/2023   HCT 41.7 09/13/2023   MCV 82.7 09/13/2023   PLT 212 09/13/2023   NEUTROABS 5.5 09/13/2023    Imaging:  No results found.  Medications: I have reviewed the patient's current medications.  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 PET October 2013-numerous FDG avid subdermal/subcutaneous nodules and bone lesions Prednisone October 2013 with initial response, tapered until February 2014 when skin lesions enlarged Rituximab 4 weekly doses beginning, followed by rituximab maintenance with completion of 2 years January 2016 Disease progression July 2019-no response to rituximab or Gleevec Revlimid/Decadron August 2019-partial response 01/28/2022-enlarging cutaneous lesion left anterior thigh, possible small similar lesions at the left abdomen and left lower leg PET  03/11/2022-heterogenous mildly enlarged right thyroid with calcification, cutaneous left anterior thigh lesion with moderate metabolic activity, cutaneous lesion at the mid anterior chest-nonspecific, left upper lobe pulmonary nodule with low-level FDG activity less than mediastinal blood pool-nonspecific Thalidomide beginning 09/13/2022 Thalidomide discontinued 12/06/2022 secondary to apparent toxicities (malaise, headaches,1 weight gain (and no regression of the cutaneous lesions) PET scan 01/21/2023-intense hypermetabolic subcutaneous nodular thickening in the posterior left buttock region; similar metabolic cutaneous lesion anterior left thigh; intense metabolic activity lateral to the right lateral femoral condyle; intense radiotracer activity in the dorsum of the left foot superficial to the proximal third phalanx 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  Stable left upper lobe pulmonary nodule with very low metabolic activity. Methotrexate, 10 mg weekly, beginning 02/07/2023, last dose 04/04/2023 Methotrexate discontinued secondary to development of a new lesion at the left upper arm and lack of a Cycle 1 weekly Rituxan 04/20/2023 Cycle 2 weekly Rituxan 04/27/2023 Cycle 3 weekly rituximab 05/04/2023 Cycle 4 weekly rituximab 05/12/2023 PET scan 09/26/2023-pending Rituximab every 3 months x 1 year beginning 10/05/2023 Diabetes Hypertension Hyperlipidemia Asthma/seasonal allergies Left lung nodule on PET 03/11/2022 CT chest 07/12/2022-unchanged left upper lobe nodule, no new suspicious nodules or masses heterogenous enlargement of the thyroid 7.  Multinodular thyroid-2 right-sided nodules meet criteria for biopsy and a calcified nodule in the right mid thyroid meets criteria for surveillance imaging 8.  L5-S1 decompressive laminotomies 1/5  Disposition: Shari Hill appears stable.  The restaging PET scan has not been read.  Per our review there has been improvement.  She will begin rituximab every 3 months for  1 year.  Plan to proceed with rituximab today as scheduled.  We will contact her once  the final PET scan report has been issued.  CBC and chemistry panel reviewed.  Labs adequate to proceed as above.  She will return for follow-up and rituximab in 12 weeks.  We are available to see her sooner if needed.  Patient seen with Dr. Truett Perna.    Lonna Cobb ANP/GNP-BC   10/05/2023  10:02 AM This was a shared visit with Lonna Cobb.  Shari Hill was interviewed and examined.  The palpable Rosai-Dorfman lesions continue to improve.  We reviewed the restaging PET images with Shari Hill.  The left buttock and left thigh lesions appear improved.  The left arm lesion is not visible on the current PET.  The final PET reading is pending.  We will request this today.  Shari Hill agrees to proceed with rituximab.  I was present for greater than 50% of today's visit.  I performed medical decision making.  Mancel Bale, MD

## 2023-10-05 NOTE — Patient Instructions (Signed)
[facility] A DEPT OF [facility]  Discharge Instructions: Thank you for choosing [facility] to provide your oncology and hematology care.   If you have a lab appointment with the Cancer Center, please go directly to the Cancer Center and check in at the registration area.   Wear comfortable clothing and clothing appropriate for easy access to any Portacath or PICC line.   We strive to give you quality time with your provider. You may need to reschedule your appointment if you arrive late (15 or more minutes).  Arriving late affects you and other patients whose appointments are after yours.  Also, if you miss three or more appointments without notifying the office, you may be dismissed from the clinic at the provider's discretion.      For prescription refill requests, have your pharmacy contact our office and allow 72 hours for refills to be completed.    Today you received the following chemotherapy and/or immunotherapy agents Rituxan      To help prevent nausea and vomiting after your treatment, we encourage you to take your nausea medication as directed.  BELOW ARE SYMPTOMS THAT SHOULD BE REPORTED IMMEDIATELY: *FEVER GREATER THAN 100.4 F (38 C) OR HIGHER *CHILLS OR SWEATING *NAUSEA AND VOMITING THAT IS NOT CONTROLLED WITH YOUR NAUSEA MEDICATION *UNUSUAL SHORTNESS OF BREATH *UNUSUAL BRUISING OR BLEEDING *URINARY PROBLEMS (pain or burning when urinating, or frequent urination) *BOWEL PROBLEMS (unusual diarrhea, constipation, pain near the anus) TENDERNESS IN MOUTH AND THROAT WITH OR WITHOUT PRESENCE OF ULCERS (sore throat, sores in mouth, or a toothache) UNUSUAL RASH, SWELLING OR PAIN  UNUSUAL VAGINAL DISCHARGE OR ITCHING   Items with * indicate a potential emergency and should be followed up as soon as possible or go to the Emergency Department if any problems should occur.  Please show the CHEMOTHERAPY ALERT CARD or IMMUNOTHERAPY  ALERT CARD at check-in to the Emergency Department and triage nurse.  Should you have questions after your visit or need to cancel or reschedule your appointment, please contact [facility]  Dept: [phone]  and follow the prompts.  Office hours are 8:00 a.m. to 4:30 p.m. Monday - Friday. Please note that voicemails left after 4:00 p.m. may not be returned until the following business day.  We are closed weekends and major holidays. You have access to a nurse at all times for urgent questions. Please call the main number to the clinic Dept: [phone] and follow the prompts.   For any non-urgent questions, you may also contact your provider using MyChart. We now offer e-Visits for anyone 36 and older to request care online for non-urgent symptoms. For details visit [email or web address].   Also download the MyChart app! Go to the app store, search "MyChart", open the app, select [place], and log in with your MyChart username and password.

## 2023-10-05 NOTE — Progress Notes (Signed)
Patient seen by Lonna Cobb NP today  Vitals are within treatment parameters:Yes   Labs are within treatment parameters: Yes   Treatment plan has been signed: Yes   Per physician team, Patient is ready for treatment and there are NO modifications to the treatment plan.

## 2023-10-06 ENCOUNTER — Other Ambulatory Visit: Payer: Self-pay

## 2023-10-07 ENCOUNTER — Other Ambulatory Visit: Payer: Self-pay | Admitting: Nurse Practitioner

## 2023-10-07 ENCOUNTER — Telehealth: Payer: Self-pay | Admitting: Nurse Practitioner

## 2023-10-07 LAB — IGG, IGA, IGM
IgA: 150 mg/dL (ref 87–352)
IgG (Immunoglobin G), Serum: 646 mg/dL (ref 586–1602)
IgM (Immunoglobulin M), Srm: 16 mg/dL — ABNORMAL LOW (ref 26–217)

## 2023-10-07 NOTE — Telephone Encounter (Signed)
I contacted Shari Hill to review the Rituximab treatment schedule. She understands the plan is for Rituxan to be given one day every 3 months to complete 1 year. She completed an infusion on 10/05/2023. The next infusion is scheduled 12/28/2023.

## 2023-10-17 ENCOUNTER — Other Ambulatory Visit: Payer: Self-pay

## 2023-10-31 NOTE — Discharge Instructions (Signed)

## 2023-11-01 ENCOUNTER — Ambulatory Visit
Admission: RE | Admit: 2023-11-01 | Discharge: 2023-11-01 | Disposition: A | Discharge status: Home / Self Care | Source: Ambulatory Visit | Attending: Neurosurgery | Admitting: Neurosurgery

## 2023-11-01 DIAGNOSIS — M4316 Spondylolisthesis, lumbar region: Secondary | ICD-10-CM

## 2023-11-01 MED ORDER — IOPAMIDOL (ISOVUE-M 200) INJECTION 41%
20.0000 mL | Freq: Once | INTRAMUSCULAR | Status: AC
  Administered 2023-11-01: 20 mL via INTRATHECAL

## 2023-11-01 MED ORDER — MEPERIDINE HCL 50 MG/ML IJ SOLN: 50.0000 mg | Freq: Once | INTRAMUSCULAR | Status: DC | PRN

## 2023-11-01 MED ORDER — DIAZEPAM 5 MG PO TABS: 5.0000 mg | ORAL_TABLET | Freq: Once | ORAL | Status: DC

## 2023-11-01 MED ORDER — ONDANSETRON HCL 4 MG/2ML IJ SOLN: 4.0000 mg | Freq: Once | INTRAMUSCULAR | Status: DC | PRN

## 2023-11-12 ENCOUNTER — Other Ambulatory Visit: Payer: Self-pay

## 2023-12-25 ENCOUNTER — Other Ambulatory Visit: Payer: Self-pay | Admitting: Oncology

## 2023-12-28 ENCOUNTER — Encounter: Payer: Self-pay | Admitting: Oncology

## 2023-12-28 ENCOUNTER — Inpatient Hospital Stay: Payer: Medicare Other

## 2023-12-28 ENCOUNTER — Inpatient Hospital Stay: Payer: Medicare Other | Attending: Oncology | Admitting: Oncology

## 2023-12-28 VITALS — BP 107/75 | HR 71 | Temp 98.2°F | Resp 14

## 2023-12-28 VITALS — BP 100/65 | HR 78 | Temp 98.1°F | Resp 18 | Ht 63.0 in | Wt 222.4 lb

## 2023-12-28 DIAGNOSIS — J45909 Unspecified asthma, uncomplicated: Secondary | ICD-10-CM | POA: Diagnosis not present

## 2023-12-28 DIAGNOSIS — E119 Type 2 diabetes mellitus without complications: Secondary | ICD-10-CM | POA: Insufficient documentation

## 2023-12-28 DIAGNOSIS — Z79899 Other long term (current) drug therapy: Secondary | ICD-10-CM | POA: Diagnosis not present

## 2023-12-28 DIAGNOSIS — M549 Dorsalgia, unspecified: Secondary | ICD-10-CM | POA: Insufficient documentation

## 2023-12-28 DIAGNOSIS — I10 Essential (primary) hypertension: Secondary | ICD-10-CM | POA: Insufficient documentation

## 2023-12-28 DIAGNOSIS — D763 Other histiocytosis syndromes: Secondary | ICD-10-CM | POA: Diagnosis present

## 2023-12-28 DIAGNOSIS — R911 Solitary pulmonary nodule: Secondary | ICD-10-CM | POA: Insufficient documentation

## 2023-12-28 DIAGNOSIS — E785 Hyperlipidemia, unspecified: Secondary | ICD-10-CM | POA: Insufficient documentation

## 2023-12-28 LAB — CMP (CANCER CENTER ONLY)
ALT: 10 U/L (ref 0–44)
AST: 14 U/L — ABNORMAL LOW (ref 15–41)
Albumin: 4.3 g/dL (ref 3.5–5.0)
Alkaline Phosphatase: 124 U/L (ref 38–126)
Anion gap: 12 (ref 5–15)
BUN: 23 mg/dL (ref 8–23)
CO2: 26 mmol/L (ref 22–32)
Calcium: 10.1 mg/dL (ref 8.9–10.3)
Chloride: 102 mmol/L (ref 98–111)
Creatinine: 1.25 mg/dL — ABNORMAL HIGH (ref 0.44–1.00)
GFR, Estimated: 46 mL/min — ABNORMAL LOW (ref >60)
Glucose, Bld: 212 mg/dL — ABNORMAL HIGH (ref 70–99)
Potassium: 4 mmol/L (ref 3.5–5.1)
Sodium: 140 mmol/L (ref 135–145)
Total Bilirubin: 0.3 mg/dL (ref 0.0–1.2)
Total Protein: 6.8 g/dL (ref 6.5–8.1)

## 2023-12-28 LAB — CBC WITH DIFFERENTIAL (CANCER CENTER ONLY)
Abs Immature Granulocytes: 0.03 10*3/uL (ref 0.00–0.07)
Basophils Absolute: 0.1 10*3/uL (ref 0.0–0.1)
Basophils Relative: 1 %
Eosinophils Absolute: 0.4 10*3/uL (ref 0.0–0.5)
Eosinophils Relative: 4 %
HCT: 41.6 % (ref 36.0–46.0)
Hemoglobin: 13.6 g/dL (ref 12.0–15.0)
Immature Granulocytes: 0 %
Lymphocytes Relative: 24 %
Lymphs Abs: 2.1 10*3/uL (ref 0.7–4.0)
MCH: 27.4 pg (ref 26.0–34.0)
MCHC: 32.7 g/dL (ref 30.0–36.0)
MCV: 83.7 fL (ref 80.0–100.0)
Monocytes Absolute: 0.6 10*3/uL (ref 0.1–1.0)
Monocytes Relative: 7 %
Neutro Abs: 5.4 10*3/uL (ref 1.7–7.7)
Neutrophils Relative %: 64 %
Platelet Count: 207 10*3/uL (ref 150–400)
RBC: 4.97 MIL/uL (ref 3.87–5.11)
RDW: 16.8 % — ABNORMAL HIGH (ref 11.5–15.5)
WBC Count: 8.5 10*3/uL (ref 4.0–10.5)
nRBC: 0 % (ref 0.0–0.2)

## 2023-12-28 MED ORDER — DIPHENHYDRAMINE HCL 25 MG PO CAPS
50.0000 mg | ORAL_CAPSULE | Freq: Once | ORAL | Status: AC
  Administered 2023-12-28: 50 mg via ORAL
  Filled 2023-12-28: qty 2

## 2023-12-28 MED ORDER — SODIUM CHLORIDE 0.9 % IV SOLN: Freq: Once | INTRAVENOUS | Status: AC

## 2023-12-28 MED ORDER — FAMOTIDINE IN NACL 20-0.9 MG/50ML-% IV SOLN
20.0000 mg | Freq: Once | INTRAVENOUS | Status: AC
  Administered 2023-12-28: 20 mg via INTRAVENOUS
  Filled 2023-12-28: qty 50

## 2023-12-28 MED ORDER — ACETAMINOPHEN 325 MG PO TABS
650.0000 mg | ORAL_TABLET | Freq: Once | ORAL | Status: AC
  Administered 2023-12-28: 650 mg via ORAL
  Filled 2023-12-28: qty 2

## 2023-12-28 MED ORDER — SODIUM CHLORIDE 0.9 % IV SOLN
375.0000 mg/m2 | Freq: Once | INTRAVENOUS | Status: AC
  Administered 2023-12-28: 800 mg via INTRAVENOUS
  Filled 2023-12-28: qty 50

## 2023-12-28 MED ORDER — PROCHLORPERAZINE MALEATE 10 MG PO TABS
10.0000 mg | ORAL_TABLET | Freq: Four times a day (QID) | ORAL | Status: DC | PRN
  Administered 2023-12-28: 10 mg via ORAL
  Filled 2023-12-28: qty 1

## 2023-12-28 NOTE — Progress Notes (Signed)
 Patient seen by Dr. Thornton Papas today  Vitals are within treatment parameters:Yes   Labs are within treatment parameters: Yes   Treatment plan has been signed: Yes   Per physician team, Patient is ready for treatment and there are NO modifications to the treatment plan.

## 2023-12-28 NOTE — Progress Notes (Signed)
  [facility] OFFICE PROGRESS NOTE   Diagnosis: Rosai-Dorfman disease  INTERVAL HISTORY:   Ms. Brammer returns as scheduled.  She completed a cycle of rituximab  on 10/05/2023.  She reports tolerating the rituximab  well.  No symptom of allergic reaction.  The skin lesions the left arm, left leg, and left buttock have not changed significantly.  She has persistent back pain.  She reports a "screw "is loose at the lumbar spine.  She is scheduled to see Dr. Lamon Pillow next week.  Objective:  Vital signs in last 24 hours:  Blood pressure 100/65, pulse 78, temperature 98.1 F (36.7 C), temperature source Temporal, resp. rate 18, height 5\' 3"  (1.6 m), weight 222 lb 6.4 oz (100.9 kg), SpO2 100%.    HEENT: No thrush Lymphatics: No cervical, supraclavicular, axillary, or inguinal nodes Resp: Lungs clear bilaterally Cardio: Regular rate and rhythm GI: No hepatosplenomegaly Vascular: No leg edema  Skin: 2 cm flat hyperpigmented lesion at the posterior left upper arm, 5 cm flat scaly lesion at the left buttock, 2 cm slightly bilobed and slightly raised lesion at the left anterior thigh   Lab Results:  Lab Results  Component Value Date   WBC 8.5 12/28/2023   HGB 13.6 12/28/2023   HCT 41.6 12/28/2023   MCV 83.7 12/28/2023   PLT 207 12/28/2023   NEUTROABS 5.4 12/28/2023    CMP  Lab Results  Component Value Date   NA 140 12/28/2023   K 4.0 12/28/2023   CL 102 12/28/2023   CO2 26 12/28/2023   GLUCOSE 212 (H) 12/28/2023   BUN 23 12/28/2023   CREATININE 1.25 (H) 12/28/2023   CALCIUM  10.1 12/28/2023   PROT 6.8 12/28/2023   ALBUMIN 4.3 12/28/2023   AST 14 (L) 12/28/2023   ALT 10 12/28/2023   ALKPHOS 124 12/28/2023   BILITOT 0.3 12/28/2023   GFRNONAA 46 (L) 12/28/2023    No results found for: "CEA1", "CEA", "WGN562", "CA125"  No results found for: "INR", "LABPROT"  Imaging:  No results found.  Medications: I have reviewed the patient's current  medications.   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 PET October 2013-numerous FDG avid subdermal/subcutaneous nodules and bone lesions Prednisone October 2013 with initial response, tapered until February 2014 when skin lesions enlarged Rituximab  4 weekly doses beginning, followed by rituximab  maintenance with completion of 2 years January 2016 Disease progression July 2019-no response to rituximab  or Gleevec Revlimid/Decadron  August 2019-partial response 01/28/2022-enlarging cutaneous lesion left anterior thigh, possible small similar lesions at the left abdomen and left lower leg PET 03/11/2022-heterogenous mildly enlarged right thyroid  with calcification, cutaneous left anterior thigh lesion with moderate metabolic activity, cutaneous lesion at the mid anterior chest-nonspecific, left upper lobe pulmonary nodule with low-level FDG activity less than mediastinal blood pool-nonspecific Thalidomide  beginning 09/13/2022 Thalidomide  discontinued 12/06/2022 secondary to apparent toxicities (malaise, headaches,1 weight gain (and no regression of the cutaneous lesions) PET scan 01/21/2023-intense hypermetabolic subcutaneous nodular thickening in the posterior left buttock region; similar metabolic cutaneous lesion anterior left thigh; intense metabolic activity lateral to the right lateral femoral condyle; intense radiotracer activity in the dorsum of the left foot superficial to the proximal third phalanx 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  Stable left upper lobe pulmonary nodule with very low metabolic activity. Methotrexate , 10 mg weekly, beginning 02/07/2023, last dose 04/04/2023 Methotrexate  discontinued secondary to development of a new lesion at the left upper arm and lack of a Cycle 1 weekly Rituxan  04/20/2023 Cycle 2 weekly Rituxan  04/27/2023  Cycle 3 weekly rituximab  05/04/2023 Cycle 4 weekly rituximab  05/12/2023 PET scan  09/26/2023-pending Rituximab  every 3 months x 1 year beginning 10/05/2023 Diabetes Hypertension Hyperlipidemia Asthma/seasonal allergies Left lung nodule on PET 03/11/2022 CT chest 07/12/2022-unchanged left upper lobe nodule, no new suspicious nodules or masses heterogenous enlargement of the thyroid  7.  Multinodular thyroid -2 right-sided nodules meet criteria for biopsy and a calcified nodule in the right mid thyroid  meets criteria for surveillance imaging 8.  L5-S1 decompressive laminotomies 1/5    Disposition: Ms. Nickleson appears stable.  She tolerated the last treatment with rituximab  well.  The palpable skin lesions appear unchanged.  She will complete another treatment with rituximab  today.  She will return for an office visit and rituximab  in 3 months.  She will call in the interim for new symptoms. She is scheduled to see Dr. Lamon Pillow for continued management of back pain.  Coni Deep, MD  12/28/2023  9:03 AM

## 2023-12-29 ENCOUNTER — Other Ambulatory Visit: Payer: Self-pay

## 2023-12-29 LAB — IGG, IGA, IGM
IgA: 154 mg/dL (ref 87–352)
IgG (Immunoglobin G), Serum: 703 mg/dL (ref 586–1602)
IgM (Immunoglobulin M), Srm: 18 mg/dL — ABNORMAL LOW (ref 26–217)

## 2024-01-03 ENCOUNTER — Other Ambulatory Visit: Payer: Self-pay | Admitting: Neurosurgery

## 2024-01-03 DIAGNOSIS — S32009K Unspecified fracture of unspecified lumbar vertebra, subsequent encounter for fracture with nonunion: Secondary | ICD-10-CM

## 2024-02-08 ENCOUNTER — Ambulatory Visit
Admission: RE | Admit: 2024-02-08 | Discharge: 2024-02-08 | Disposition: A | Discharge status: Home / Self Care | Source: Ambulatory Visit | Attending: Neurosurgery | Admitting: Neurosurgery

## 2024-02-08 DIAGNOSIS — S32009K Unspecified fracture of unspecified lumbar vertebra, subsequent encounter for fracture with nonunion: Secondary | ICD-10-CM

## 2024-02-17 ENCOUNTER — Other Ambulatory Visit: Payer: Self-pay | Admitting: Neurosurgery

## 2024-02-23 ENCOUNTER — Other Ambulatory Visit: Payer: Self-pay

## 2024-02-29 IMAGING — MR MR LUMBAR SPINE W/O CM
4 of 5 series · 26 of 48 positions shown · non-contrast
Comparison: X-ray 12/29/2021

CLINICAL DATA: Low back pain with bilateral radiculopathy, right
worse than left

EXAM:
MRI LUMBAR SPINE WITHOUT CONTRAST
TECHNIQUE: Multiplanar, multisequence MR imaging of the lumbar spine was
performed. No intravenous contrast was administered.

[Series 2: T2 · sagittal · 4.0mm · 0.53mm/px · 6 of 15 slices shown (1 of 2)]
[im 1/15]
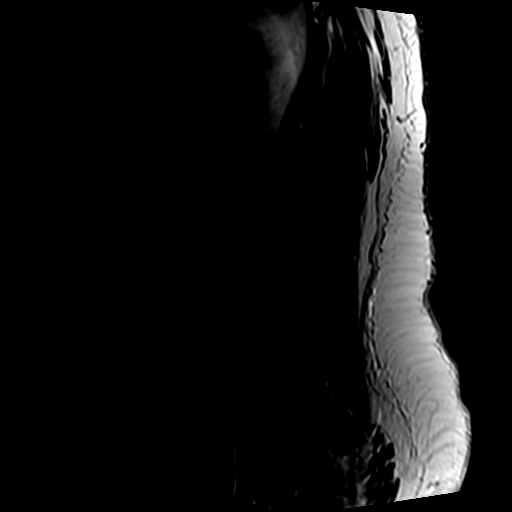
[im 3/15]
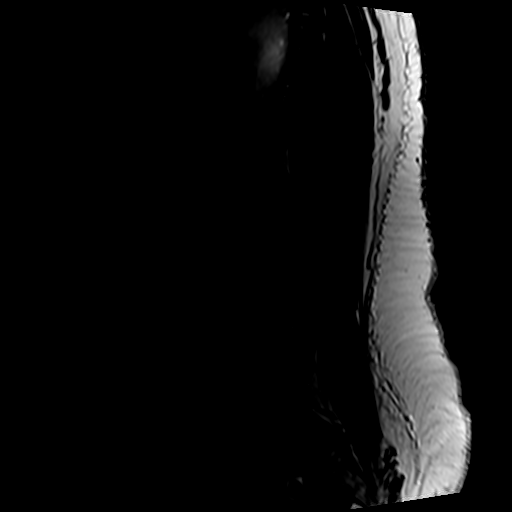
[im 6/15]
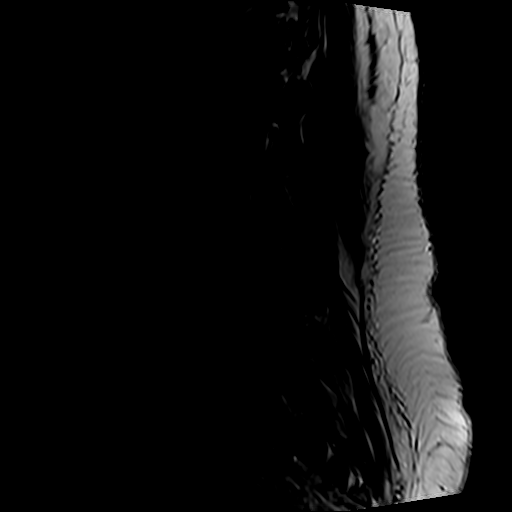
[im 9/15]
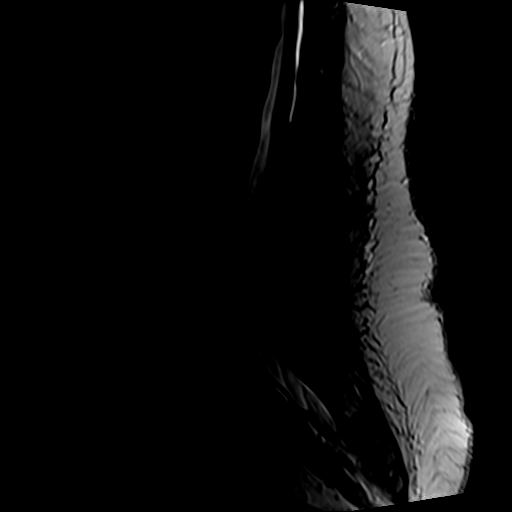
[im 12/15]
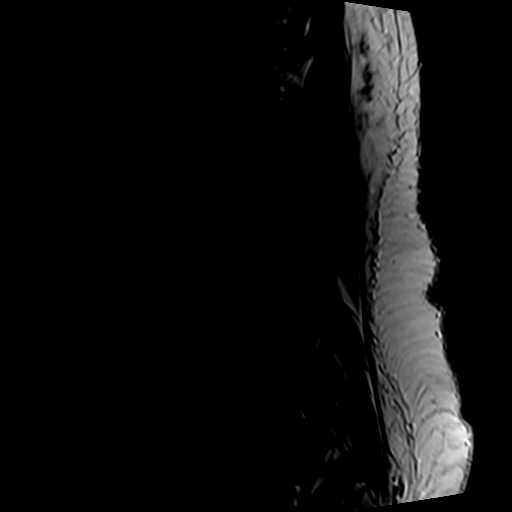
[im 15/15]
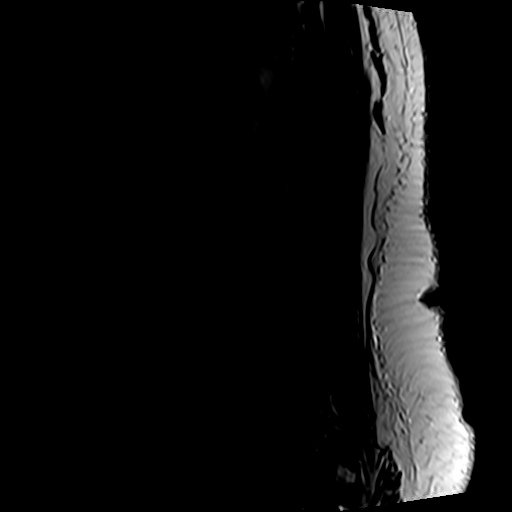

[Series 4: T1 · sagittal · 4.0mm · 0.53mm/px · 6 of 15 slices shown (1 of 2)]
[im 1/15]
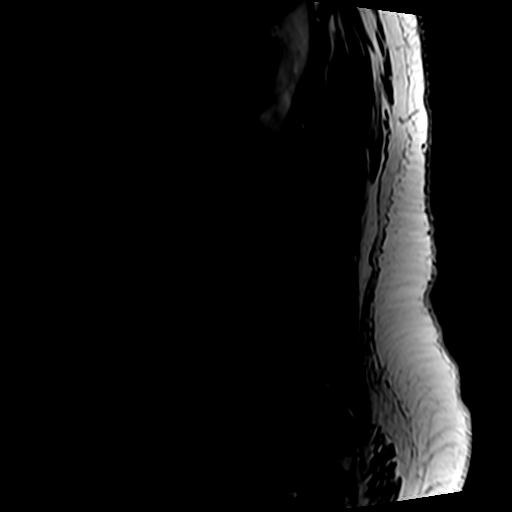
[im 3/15]
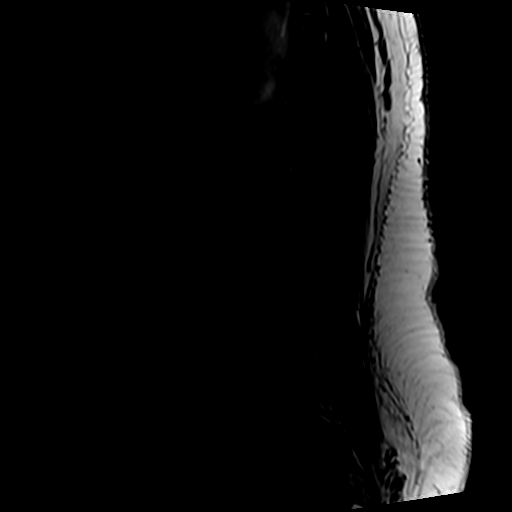
[im 6/15]
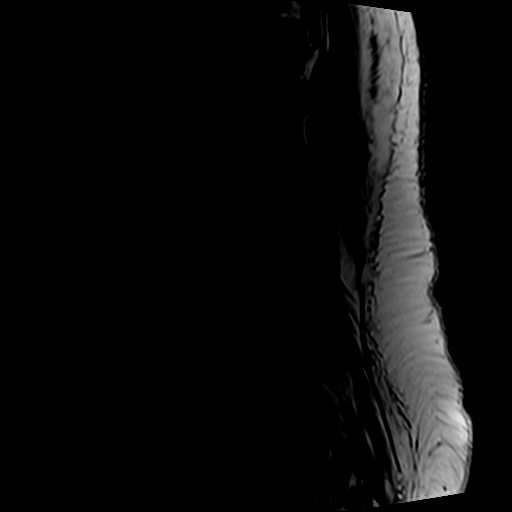
[im 9/15]
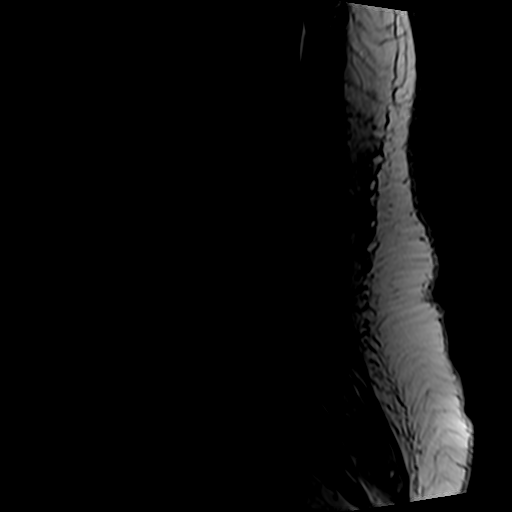
[im 12/15]
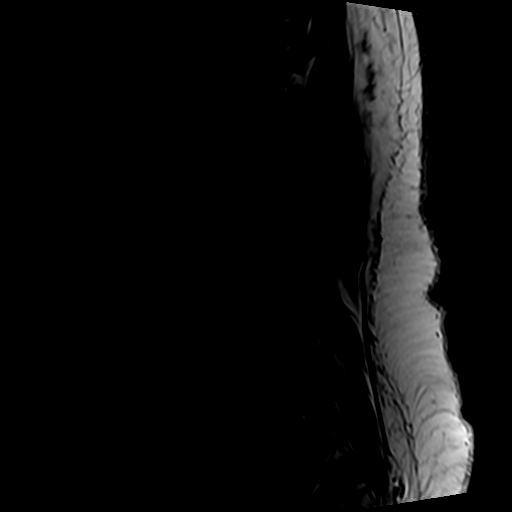
[im 15/15]
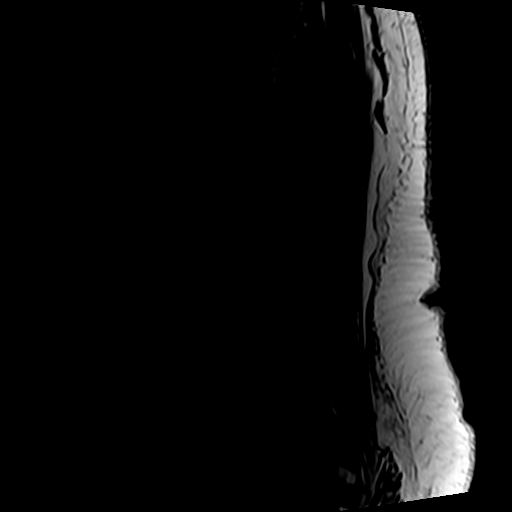

[Series 5: T2 · axial · 4.0mm · 0.70mm/px · z∈[-95,+117]mm · 9 of 38 slices shown (2 of 2)]
[im 1/38]
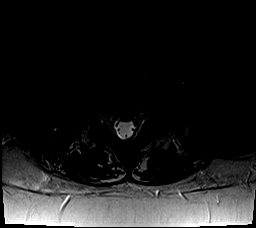
[im 6/38]
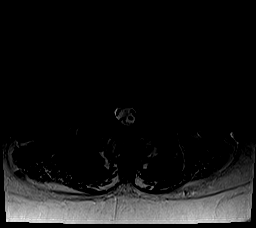
[im 11/38]
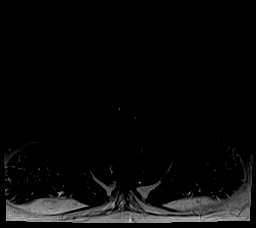
[im 16/38]
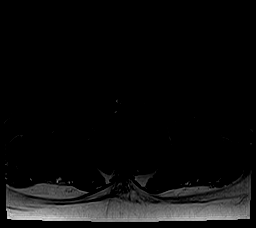
[im 19/38]
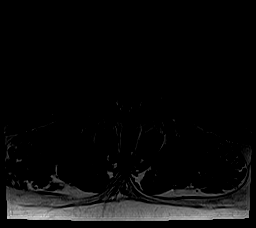
[im 22/38]
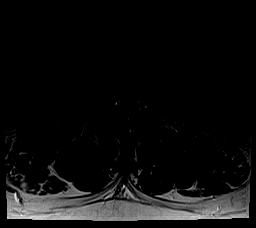
[im 27/38]
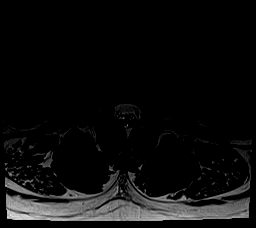
[im 32/38]
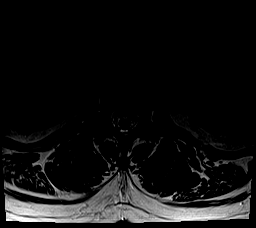
[im 38/38]
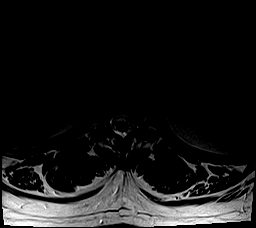

[Series 6: T1 · axial · 4.0mm · 0.35mm/px · z∈[-95,+87]mm · 5 of 38 slices shown (2 of 2)]
[im 1/38]
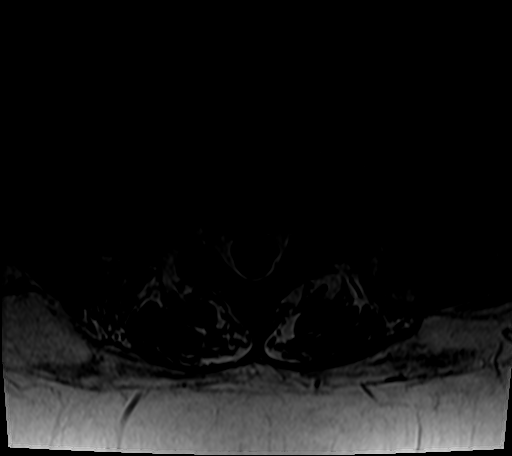
[im 6/38]
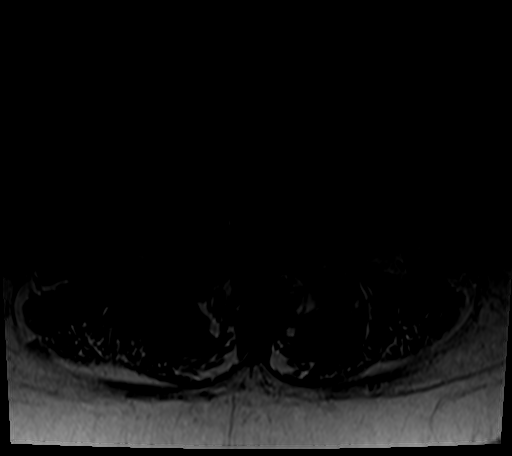
[im 11/38]
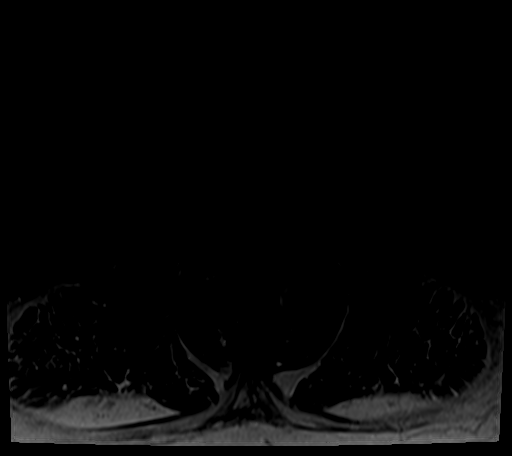
[im 19/38]
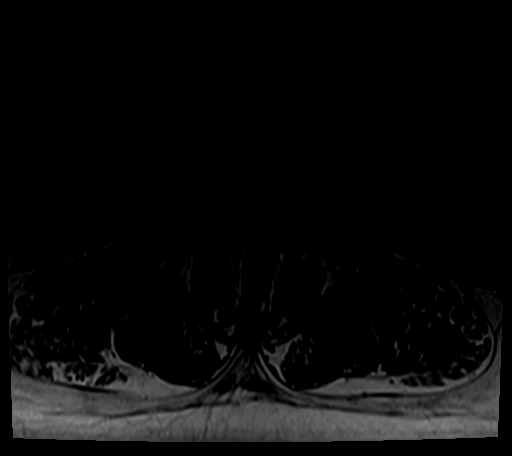
[im 32/38]
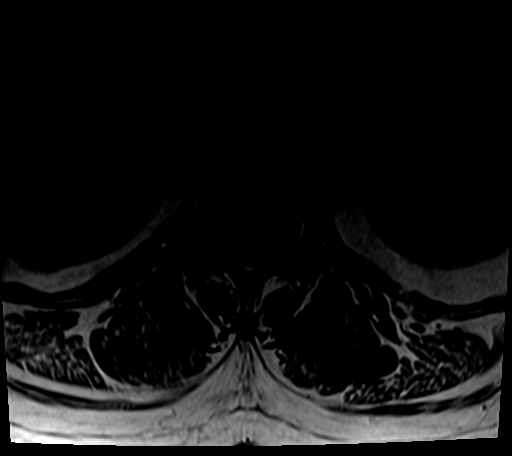

[26 of 48 positions shown; findings below may reference images not displayed]

FINDINGS: Segmentation:  Standard.

Alignment: 3 mm grade 1 anterolisthesis of L5 on S1. Trace
anterolisthesis at L4-5.

Vertebrae:  No fracture, evidence of discitis, or bone lesion.

Conus medullaris and cauda equina: Conus extends to the L2 level.
Conus and cauda equina appear normal.

Paraspinal and other soft tissues: Negative.

Disc levels:

T12-L1: Unremarkable.

L1-L2: Unremarkable.

L2-L3: Unremarkable.

L3-L4: Mild diffuse disc bulge. Mild bilateral facet arthropathy and
ligamentum flavum buckling. Mild impress upon the thecal sac without
significant canal stenosis. Borderline-mild bilateral foraminal
stenosis.

L4-L5: Slight disc uncovering with mild diffuse disc bulge. Moderate
bilateral facet arthropathy. Mild bilateral foraminal stenosis,
right greater than left. No significant canal stenosis.

L5-S1: Anterolisthesis with disc uncovering and minimal disc bulge.
Moderate-severe bilateral facet arthropathy. Mild-moderate bilateral
foraminal stenosis. No canal stenosis.
IMPRESSION: 1. Lower lumbar spondylosis including grade 1 anterolisthesis of L5
on S1 due to facet arthropathy.
2. Mild-moderate bilateral foraminal stenosis at L5-S1 and mild
bilateral foraminal stenosis at L4-5.
3. No significant canal stenosis at any level.

## 2024-03-05 NOTE — Pre-Procedure Instructions (Signed)
 Surgical Instructions   Your procedure is scheduled on Friday, July 18th. Report to [facility] Main Entrance A at 07:30 A.M., then check in with the Admitting office. Any questions or running late day of surgery: call [phone]  Questions prior to your surgery date: call [phone], Monday-Friday, 8am-4pm. If you experience any cold or flu symptoms such as cough, fever, chills, shortness of breath, etc. between now and your scheduled surgery, please notify us  at the above number.     Remember:  Do not eat or drink after midnight the night before your surgery    Take these medicines the morning of surgery with A SIP OF WATER  May take these medicines IF NEEDED: albuterol  (VENTOLIN  HFA)- bring inhaler with you on day of surgery azelastine  (OPTIVAR ) eye drops budesonide-formoterol  (SYMBICORT)  EPINEPHrine   ipratropium (ATROVENT)   Follow your surgeon's instructions on when to stop Aspirin .  If no instructions were given by your surgeon then you will need to call the office to get those instructions.    One week prior to surgery, STOP taking any Aleve, Naproxen, Ibuprofen, Motrin, Advil, Goody's, BC's, all herbal medications, fish oil, and non-prescription vitamins.    WHAT DO I DO ABOUT MY DIABETES MEDICATION?   Do not take sitaGLIPtin  (JANUVIA ) the morning of surgery.  STOP taking  Dulaglutide (TRULICITY) 7 days prior to surgery. Last dose 7/5.      HOW TO MANAGE YOUR DIABETES BEFORE AND AFTER SURGERY  Why is it important to control my blood sugar before and after surgery? Improving blood sugar levels before and after surgery helps healing and can limit problems. A way of improving blood sugar control is eating a healthy diet by:  Eating less sugar and carbohydrates  Increasing activity/exercise  Talking with your doctor about reaching your blood sugar goals High blood sugars (greater than 180 mg/dL) can raise your risk of infections and slow your recovery, so you  will need to focus on controlling your diabetes during the weeks before surgery. Make sure that the doctor who takes care of your diabetes knows about your planned surgery including the date and location.  How do I manage my blood sugar before surgery? Check your blood sugar at least 4 times a day, starting 2 days before surgery, to make sure that the level is not too high or low.  Check your blood sugar the morning of your surgery when you wake up and every 2 hours until you get to the Short Stay unit.  If your blood sugar is less than 70 mg/dL, you will need to treat for low blood sugar: Do not take insulin . Treat a low blood sugar (less than 70 mg/dL) with  cup of clear juice (cranberry or apple), 4 glucose tablets, OR glucose gel. Recheck blood sugar in 15 minutes after treatment (to make sure it is greater than 70 mg/dL). If your blood sugar is not greater than 70 mg/dL on recheck, call [phone] for further instructions. Report your blood sugar to the short stay nurse when you get to Short Stay.  If you are admitted to the hospital after surgery: Your blood sugar will be checked by the staff and you will probably be given insulin  after surgery (instead of oral diabetes medicines) to make sure you have good blood sugar levels. The goal for blood sugar control after surgery is 80-180 mg/dL.                   Do NOT Smoke (Tobacco/Vaping) for  24 hours prior to your procedure.  If you use a CPAP at night, you may bring your mask/headgear for your overnight stay.   You will be asked to remove any contacts, glasses, piercing's, hearing aid's, dentures/partials prior to surgery. Please bring cases for these items if needed.    Patients discharged the day of surgery will not be allowed to drive home, and someone needs to stay with them for 24 hours.  SURGICAL WAITING ROOM VISITATION Patients may have no more than 2 support people in the waiting area - these visitors may rotate.   Pre-op  nurse will coordinate an appropriate time for 1 ADULT support person, who may not rotate, to accompany patient in pre-op.  Children under the age of 19 must have an adult with them who is not the patient and must remain in the main waiting area with an adult.  If the patient needs to stay at the hospital during part of their recovery, the visitor guidelines for inpatient rooms apply.  Please refer to the [facility] website for the visitor guidelines for any additional information.   If you received a COVID test during your pre-op visit  it is requested that you wear a mask when out in public, stay away from anyone that may not be feeling well and notify your surgeon if you develop symptoms. If you have been in contact with anyone that has tested positive in the last 10 days please notify you surgeon.      Pre-operative 5 CHG Bathing Instructions   You can play a key role in reducing the risk of infection after surgery. Your skin needs to be as free of germs as possible. You can reduce the number of germs on your skin by washing with CHG (chlorhexidine  gluconate) soap before surgery. CHG is an antiseptic soap that kills germs and continues to kill germs even after washing.   DO NOT use if you have an allergy to chlorhexidine /CHG or antibacterial soaps. If your skin becomes reddened or irritated, stop using the CHG and notify one of our RNs at [phone].   Please shower with the CHG soap starting 4 days before surgery using the following schedule:     Please keep in mind the following:  DO NOT shave, including legs and underarms, starting the day of your first shower.   You may shave your face at any point before/day of surgery.  Place clean sheets on your bed the day you start using CHG soap. Use a clean washcloth (not used since being washed) for each shower. DO NOT sleep with pets once you start using the CHG.   CHG Shower Instructions:  Wash your face and private area with normal  soap. If you choose to wash your hair, wash first with your normal shampoo.  After you use shampoo/soap, rinse your hair and body thoroughly to remove shampoo/soap residue.  Turn the water OFF and apply about 3 tablespoons (45 ml) of CHG soap to a CLEAN washcloth.  Apply CHG soap ONLY FROM YOUR NECK DOWN TO YOUR TOES (washing for 3-5 minutes)  DO NOT use CHG soap on face, private areas, open wounds, or sores.  Pay special attention to the area where your surgery is being performed.  If you are having back surgery, having someone wash your back for you may be helpful. Wait 2 minutes after CHG soap is applied, then you may rinse off the CHG soap.  Pat dry with a clean towel  Put on  clean clothes/pajamas   If you choose to wear lotion, please use ONLY the CHG-compatible lotions that are listed below.  Additional instructions for the day of surgery: DO NOT APPLY any lotions, deodorants, cologne, or perfumes.   Do not bring valuables to the hospital. [facility] is not responsible for any belongings/valuables. Do not wear nail polish, gel polish, artificial nails, or any other type of covering on natural nails (fingers and toes) Do not wear jewelry or makeup Put on clean/comfortable clothes.  Please brush your teeth.  Ask your nurse before applying any prescription medications to the skin.     CHG Compatible Lotions   Aveeno Moisturizing lotion  Cetaphil Moisturizing Cream  Cetaphil Moisturizing Lotion  Clairol Herbal Essence Moisturizing Lotion, Dry Skin  Clairol Herbal Essence Moisturizing Lotion, Extra Dry Skin  Clairol Herbal Essence Moisturizing Lotion, Normal Skin  Curel Age Defying Therapeutic Moisturizing Lotion with Alpha Hydroxy  Curel Extreme Care Body Lotion  Curel Soothing Hands Moisturizing Hand Lotion  Curel Therapeutic Moisturizing Cream, Fragrance-Free  Curel Therapeutic Moisturizing Lotion, Fragrance-Free  Curel Therapeutic Moisturizing Lotion, Original Formula   Eucerin Daily Replenishing Lotion  Eucerin Dry Skin Therapy Plus Alpha Hydroxy Crme  Eucerin Dry Skin Therapy Plus Alpha Hydroxy Lotion  Eucerin Original Crme  Eucerin Original Lotion  Eucerin Plus Crme Eucerin Plus Lotion  Eucerin TriLipid Replenishing Lotion  Keri Anti-Bacterial Hand Lotion  Keri Deep Conditioning Original Lotion Dry Skin Formula Softly Scented  Keri Deep Conditioning Original Lotion, Fragrance Free Sensitive Skin Formula  Keri Lotion Fast Absorbing Fragrance Free Sensitive Skin Formula  Keri Lotion Fast Absorbing Softly Scented Dry Skin Formula  Keri Original Lotion  Keri Skin Renewal Lotion Keri Silky Smooth Lotion  Keri Silky Smooth Sensitive Skin Lotion  Nivea Body Creamy Conditioning Oil  Nivea Body Extra Enriched Lotion  Nivea Body Original Lotion  Nivea Body Sheer Moisturizing Lotion Nivea Crme  Nivea Skin Firming Lotion  NutraDerm 30 Skin Lotion  NutraDerm Skin Lotion  NutraDerm Therapeutic Skin Cream  NutraDerm Therapeutic Skin Lotion  ProShield Protective Hand Cream  Provon moisturizing lotion  Please read over the following fact sheets that you were given.

## 2024-03-06 ENCOUNTER — Encounter (HOSPITAL_COMMUNITY)
Admission: RE | Admit: 2024-03-06 | Discharge: 2024-03-06 | Disposition: A | Discharge status: Home / Self Care | Source: Ambulatory Visit | Attending: Neurosurgery | Admitting: Neurosurgery

## 2024-03-06 ENCOUNTER — Other Ambulatory Visit: Payer: Self-pay

## 2024-03-06 ENCOUNTER — Encounter (HOSPITAL_COMMUNITY): Payer: Self-pay

## 2024-03-06 VITALS — BP 119/74 | HR 79 | Temp 98.7°F | Resp 17 | Ht 63.0 in | Wt 227.0 lb

## 2024-03-06 DIAGNOSIS — E119 Type 2 diabetes mellitus without complications: Secondary | ICD-10-CM | POA: Insufficient documentation

## 2024-03-06 DIAGNOSIS — I10 Essential (primary) hypertension: Secondary | ICD-10-CM | POA: Insufficient documentation

## 2024-03-06 DIAGNOSIS — Z01818 Encounter for other preprocedural examination: Secondary | ICD-10-CM | POA: Insufficient documentation

## 2024-03-06 HISTORY — DX: Unspecified osteoarthritis, unspecified site: M19.90

## 2024-03-06 LAB — CBC
HCT: 41.5 % (ref 36.0–46.0)
Hemoglobin: 13.5 g/dL (ref 12.0–15.0)
MCH: 27.9 pg (ref 26.0–34.0)
MCHC: 32.5 g/dL (ref 30.0–36.0)
MCV: 85.7 fL (ref 80.0–100.0)
Platelets: 219 K/uL (ref 150–400)
RBC: 4.84 MIL/uL (ref 3.87–5.11)
RDW: 15.3 % (ref 11.5–15.5)
WBC: 8.7 K/uL (ref 4.0–10.5)
nRBC: 0 % (ref 0.0–0.2)

## 2024-03-06 LAB — BASIC METABOLIC PANEL WITH GFR
Anion gap: 10 (ref 5–15)
BUN: 18 mg/dL (ref 8–23)
CO2: 28 mmol/L (ref 22–32)
Calcium: 9.7 mg/dL (ref 8.9–10.3)
Chloride: 102 mmol/L (ref 98–111)
Creatinine, Ser: 1.07 mg/dL — ABNORMAL HIGH (ref 0.44–1.00)
GFR, Estimated: 56 mL/min — ABNORMAL LOW (ref >60)
Glucose, Bld: 185 mg/dL — ABNORMAL HIGH (ref 70–99)
Potassium: 3.4 mmol/L — ABNORMAL LOW (ref 3.5–5.1)
Sodium: 140 mmol/L (ref 135–145)

## 2024-03-06 LAB — HEMOGLOBIN A1C
Hgb A1c MFr Bld: 8.1 % — ABNORMAL HIGH (ref 4.8–5.6)
Mean Plasma Glucose: 185.77 mg/dL

## 2024-03-06 LAB — GLUCOSE, CAPILLARY: Glucose-Capillary: 191 mg/dL — ABNORMAL HIGH (ref 70–99)

## 2024-03-06 LAB — SURGICAL PCR SCREEN
MRSA, PCR: NEGATIVE
Staphylococcus aureus: NEGATIVE

## 2024-03-06 NOTE — Progress Notes (Addendum)
 PCP - Dr. Artist Cohens Cardiologist -   PPM/ICD - denies Device Orders - n/a Rep Notified - n/a  Chest x-ray - denies EKG - 03-06-24 Stress Test - denies ECHO - denies Cardiac Cath - denies  Sleep Study - 2016 CPAP - uses nightly  Fasting Blood Sugar - Per patient blood sugar rangers 128-140. Blood sugar at PAT appointment 191.  Checks Blood Sugar daily. Last A1c 8.8 on 02-11-23  Last dose of GLP1 agonist-  Dulaglutide (TRULICITY)  GLP1 instructions: Last dose 03-02-24  Blood Thinner Instructions: denies Aspirin  Instructions: last dose 03-02-24  ERAS Protcol - NPO   COVID TEST-    Anesthesia review: yes Difficult intubation  Patient denies shortness of breath, fever, cough and chest pain at PAT appointment   All instructions explained to the patient, with a verbal understanding of the material. Patient agrees to go over the instructions while at home for a better understanding. Patient also instructed to self quarantine after being tested for COVID-19. The opportunity to ask questions was provided.

## 2024-03-07 NOTE — Anesthesia Preprocedure Evaluation (Signed)
 Anesthesia Evaluation  Patient identified by MRN, date of birth, ID band Patient awake    Reviewed: Allergy & Precautions, NPO status , Patient's Chart, lab work & pertinent test results, reviewed documented beta blocker date and time   History of Anesthesia Complications (+) PONV, DIFFICULT AIRWAY and history of anesthetic complications (last 2 anesthetics easy with VideoGlide and Parker tube)  Airway Mallampati: IV  TM Distance: >3 FB Neck ROM: Full    Dental  (+) Dental Advisory Given   Pulmonary sleep apnea and Continuous Positive Airway Pressure Ventilation , COPD,  COPD inhaler   breath sounds clear to auscultation       Cardiovascular hypertension, Pt. on medications and Pt. on home beta blockers (-) angina  Rhythm:Regular Rate:Normal     Neuro/Psych negative neurological ROS     GI/Hepatic negative GI ROS, Neg liver ROS,,,  Endo/Other  diabetes (glu 185), Oral Hypoglycemic Agents    Renal/GU negative Renal ROS     Musculoskeletal  (+) Arthritis ,    Abdominal   Peds  Hematology Hb 13.5, plt 219k   Anesthesia Other Findings   Reproductive/Obstetrics                              Anesthesia Physical Anesthesia Plan  ASA: 3  Anesthesia Plan: General   Post-op Pain Management: Tylenol  PO (pre-op)*   Induction: Intravenous  PONV Risk Score and Plan: 4 or greater and Ondansetron , Dexamethasone  and Scopolamine  patch - Pre-op  Airway Management Planned: Oral ETT and Video Laryngoscope Planned  Additional Equipment: None  Intra-op Plan:   Post-operative Plan: Extubation in OR  Informed Consent: I have reviewed the patients History and Physical, chart, labs and discussed the procedure including the risks, benefits and alternatives for the proposed anesthesia with the patient or authorized representative who has indicated his/her understanding and acceptance.     Dental  advisory given  Plan Discussed with: CRNA and Surgeon  Anesthesia Plan Comments: ( History of difficult airway. Anesthesia intubation note from 02/16/23 copied below:  Procedure Name: Intubation Date/Time: 02/16/2023 11:28 AM   Performed by: Nanci Riis, CRNAPre-anesthesia Checklist: Patient identified, Emergency Drugs available, Suction available, Patient being monitored and Timeout performed Patient Re-evaluated:Patient Re-evaluated prior to induction Oxygen Delivery Method: Circle system utilized Preoxygenation: Pre-oxygenation with 100% oxygen Induction Type: IV induction Ventilation: Mask ventilation without difficulty Laryngoscope Size: Glidescope and 3 Grade View: Grade II Tube type: Parker flex tip Tube size: 7.0 mm Number of attempts: 3 Airway Equipment and Method: Stylet and Video-laryngoscopy Placement Confirmation: ETT inserted through vocal cords under direct vision, positive ETCO2 and breath sounds checked- equal and bilateral Secured at: 21 cm Tube secured with: Tape Dental Injury: Bloody posterior oropharynx  Difficulty Due To: Difficulty was anticipated, Difficult Airway- due to large tongue and Difficult Airway- due to reduced neck mobility   )         Anesthesia Quick Evaluation

## 2024-03-09 ENCOUNTER — Encounter (HOSPITAL_COMMUNITY)
Admission: RE | Disposition: A | Discharge status: Home / Self Care | Payer: Self-pay | Source: Home / Self Care | Attending: Neurosurgery

## 2024-03-09 ENCOUNTER — Inpatient Hospital Stay (HOSPITAL_COMMUNITY): Payer: Self-pay | Admitting: Anesthesiology

## 2024-03-09 ENCOUNTER — Other Ambulatory Visit: Payer: Self-pay

## 2024-03-09 ENCOUNTER — Encounter (HOSPITAL_COMMUNITY): Payer: Self-pay | Admitting: Neurosurgery

## 2024-03-09 ENCOUNTER — Inpatient Hospital Stay (HOSPITAL_COMMUNITY): Payer: Self-pay | Admitting: Physician Assistant

## 2024-03-09 ENCOUNTER — Inpatient Hospital Stay (HOSPITAL_COMMUNITY)
Admission: RE | Admit: 2024-03-09 | Discharge: 2024-03-09 | DRG: 451 | Disposition: A | Discharge status: Home / Self Care | Attending: Neurosurgery | Admitting: Neurosurgery

## 2024-03-09 ENCOUNTER — Inpatient Hospital Stay (HOSPITAL_COMMUNITY)

## 2024-03-09 DIAGNOSIS — Z7951 Long term (current) use of inhaled steroids: Secondary | ICD-10-CM

## 2024-03-09 DIAGNOSIS — Z888 Allergy status to other drugs, medicaments and biological substances status: Secondary | ICD-10-CM

## 2024-03-09 DIAGNOSIS — I10 Essential (primary) hypertension: Secondary | ICD-10-CM | POA: Diagnosis present

## 2024-03-09 DIAGNOSIS — Z91040 Latex allergy status: Secondary | ICD-10-CM

## 2024-03-09 DIAGNOSIS — Z79899 Other long term (current) drug therapy: Secondary | ICD-10-CM | POA: Diagnosis not present

## 2024-03-09 DIAGNOSIS — Z7982 Long term (current) use of aspirin: Secondary | ICD-10-CM

## 2024-03-09 DIAGNOSIS — Z9049 Acquired absence of other specified parts of digestive tract: Secondary | ICD-10-CM

## 2024-03-09 DIAGNOSIS — T84296A Other mechanical complication of internal fixation device of vertebrae, initial encounter: Secondary | ICD-10-CM

## 2024-03-09 DIAGNOSIS — E119 Type 2 diabetes mellitus without complications: Secondary | ICD-10-CM | POA: Diagnosis present

## 2024-03-09 DIAGNOSIS — M960 Pseudarthrosis after fusion or arthrodesis: Secondary | ICD-10-CM | POA: Diagnosis present

## 2024-03-09 DIAGNOSIS — Y839 Surgical procedure, unspecified as the cause of abnormal reaction of the patient, or of later complication, without mention of misadventure at the time of the procedure: Secondary | ICD-10-CM | POA: Diagnosis present

## 2024-03-09 DIAGNOSIS — E785 Hyperlipidemia, unspecified: Secondary | ICD-10-CM | POA: Diagnosis present

## 2024-03-09 DIAGNOSIS — Z7984 Long term (current) use of oral hypoglycemic drugs: Secondary | ICD-10-CM

## 2024-03-09 DIAGNOSIS — S32009K Unspecified fracture of unspecified lumbar vertebra, subsequent encounter for fracture with nonunion: Secondary | ICD-10-CM | POA: Diagnosis present

## 2024-03-09 DIAGNOSIS — M1990 Unspecified osteoarthritis, unspecified site: Secondary | ICD-10-CM | POA: Diagnosis present

## 2024-03-09 DIAGNOSIS — J449 Chronic obstructive pulmonary disease, unspecified: Secondary | ICD-10-CM

## 2024-03-09 DIAGNOSIS — Z881 Allergy status to other antibiotic agents status: Secondary | ICD-10-CM | POA: Diagnosis not present

## 2024-03-09 DIAGNOSIS — Z01818 Encounter for other preprocedural examination: Secondary | ICD-10-CM

## 2024-03-09 DIAGNOSIS — T84226A Displacement of internal fixation device of vertebrae, initial encounter: Principal | ICD-10-CM | POA: Diagnosis present

## 2024-03-09 HISTORY — PX: HARDWARE REMOVAL: SHX979

## 2024-03-09 LAB — GLUCOSE, CAPILLARY
Glucose-Capillary: 185 mg/dL — ABNORMAL HIGH (ref 70–99)
Glucose-Capillary: 128 mg/dL — ABNORMAL HIGH (ref 70–99)

## 2024-03-09 SURGERY — REMOVAL, HARDWARE
Anesthesia: General | Site: Back

## 2024-03-09 MED ORDER — FLUTICASONE FUROATE-VILANTEROL 200-25 MCG/ACT IN AEPB: 1.0000 | INHALATION_SPRAY | Freq: Every day | RESPIRATORY_TRACT | Status: DC

## 2024-03-09 MED ORDER — ONDANSETRON HCL 4 MG/2ML IJ SOLN: 4.0000 mg | Freq: Four times a day (QID) | INTRAMUSCULAR | Status: DC | PRN

## 2024-03-09 MED ORDER — LISINOPRIL 20 MG PO TABS
40.0000 mg | ORAL_TABLET | Freq: Every morning | ORAL | Status: DC
  Filled 2024-03-09: qty 2

## 2024-03-09 MED ORDER — CEFAZOLIN SODIUM-DEXTROSE 2-4 GM/100ML-% IV SOLN
2.0000 g | Freq: Three times a day (TID) | INTRAVENOUS | Status: DC
  Filled 2024-03-09: qty 100

## 2024-03-09 MED ORDER — ONDANSETRON HCL 4 MG/2ML IJ SOLN
INTRAMUSCULAR | Status: DC | PRN
  Administered 2024-03-09: 4 mg via INTRAVENOUS

## 2024-03-09 MED ORDER — PANTOPRAZOLE SODIUM 40 MG IV SOLR: 40.0000 mg | Freq: Every day | INTRAVENOUS | Status: DC

## 2024-03-09 MED ORDER — SUGAMMADEX SODIUM 200 MG/2ML IV SOLN
INTRAVENOUS | Status: DC | PRN
  Administered 2024-03-09: 200 mg via INTRAVENOUS

## 2024-03-09 MED ORDER — BUPIVACAINE LIPOSOME 1.3 % IJ SUSP
INTRAMUSCULAR | Status: DC | PRN
  Administered 2024-03-09: 20 mL

## 2024-03-09 MED ORDER — ONDANSETRON HCL 4 MG/2ML IJ SOLN
INTRAMUSCULAR | Status: AC
Start: 2024-03-09 — End: 2024-03-09
  Filled 2024-03-09: qty 2

## 2024-03-09 MED ORDER — LIDOCAINE 2% (20 MG/ML) 5 ML SYRINGE
INTRAMUSCULAR | Status: DC | PRN
  Administered 2024-03-09: 50 mg via INTRAVENOUS

## 2024-03-09 MED ORDER — LIDOCAINE-EPINEPHRINE 1 %-1:100000 IJ SOLN
INTRAMUSCULAR | Status: DC | PRN
  Administered 2024-03-09: 6 mL

## 2024-03-09 MED ORDER — PROPOFOL 10 MG/ML IV BOLUS
INTRAVENOUS | Status: DC | PRN
  Administered 2024-03-09: 150 mg via INTRAVENOUS

## 2024-03-09 MED ORDER — LACTATED RINGERS IV SOLN: INTRAVENOUS | Status: DC

## 2024-03-09 MED ORDER — HYDROCODONE-ACETAMINOPHEN 5-325 MG PO TABS: 2.0000 | ORAL_TABLET | ORAL | Status: DC | PRN

## 2024-03-09 MED ORDER — SUGAMMADEX SODIUM 200 MG/2ML IV SOLN
INTRAVENOUS | Status: AC
  Filled 2024-03-09: qty 2

## 2024-03-09 MED ORDER — CYCLOBENZAPRINE HCL 10 MG PO TABS
10.0000 mg | ORAL_TABLET | Freq: Three times a day (TID) | ORAL | Status: DC | PRN
  Administered 2024-03-09: 10 mg via ORAL
  Filled 2024-03-09: qty 1

## 2024-03-09 MED ORDER — EPINEPHRINE 0.3 MG/0.3ML IJ SOAJ: 0.3000 mg | INTRAMUSCULAR | Status: DC | PRN

## 2024-03-09 MED ORDER — INSULIN ASPART 100 UNIT/ML IJ SOLN
0.0000 [IU] | INTRAMUSCULAR | Status: DC | PRN
  Administered 2024-03-09: 2 [IU] via SUBCUTANEOUS

## 2024-03-09 MED ORDER — ORAL CARE MOUTH RINSE
15.0000 mL | Freq: Once | OROMUCOSAL | Status: AC
Start: 2024-03-09 — End: 2024-03-09

## 2024-03-09 MED ORDER — CEFAZOLIN SODIUM-DEXTROSE 2-4 GM/100ML-% IV SOLN
2.0000 g | INTRAVENOUS | Status: AC
  Administered 2024-03-09: 2 g via INTRAVENOUS
  Filled 2024-03-09: qty 100

## 2024-03-09 MED ORDER — CHLORHEXIDINE GLUCONATE CLOTH 2 % EX PADS: 6.0000 | MEDICATED_PAD | Freq: Once | CUTANEOUS | Status: DC

## 2024-03-09 MED ORDER — SODIUM CHLORIDE 0.9 % IV SOLN: 250.0000 mL | INTRAVENOUS | Status: DC

## 2024-03-09 MED ORDER — METOPROLOL SUCCINATE ER 50 MG PO TB24: 50.0000 mg | ORAL_TABLET | Freq: Every evening | ORAL | Status: DC

## 2024-03-09 MED ORDER — CYCLOBENZAPRINE HCL 10 MG PO TABS: 10.0000 mg | ORAL_TABLET | Freq: Three times a day (TID) | ORAL | 0 refills | Status: AC | PRN

## 2024-03-09 MED ORDER — FENTANYL CITRATE (PF) 250 MCG/5ML IJ SOLN
INTRAMUSCULAR | Status: DC | PRN
  Administered 2024-03-09: 50 ug via INTRAVENOUS
  Administered 2024-03-09: 150 ug via INTRAVENOUS

## 2024-03-09 MED ORDER — AMLODIPINE BESYLATE 10 MG PO TABS: 10.0000 mg | ORAL_TABLET | Freq: Every evening | ORAL | Status: DC

## 2024-03-09 MED ORDER — MIDAZOLAM HCL 2 MG/2ML IJ SOLN
INTRAMUSCULAR | Status: AC
  Filled 2024-03-09: qty 2

## 2024-03-09 MED ORDER — DEXAMETHASONE SODIUM PHOSPHATE 10 MG/ML IJ SOLN
INTRAMUSCULAR | Status: DC | PRN
  Administered 2024-03-09: 10 mg via INTRAVENOUS

## 2024-03-09 MED ORDER — HYDROMORPHONE HCL 1 MG/ML IJ SOLN: 0.5000 mg | INTRAMUSCULAR | Status: DC | PRN

## 2024-03-09 MED ORDER — ACETAMINOPHEN 325 MG PO TABS: 650.0000 mg | ORAL_TABLET | ORAL | Status: DC | PRN

## 2024-03-09 MED ORDER — CHLORHEXIDINE GLUCONATE 0.12 % MT SOLN
15.0000 mL | Freq: Once | OROMUCOSAL | Status: AC
  Administered 2024-03-09: 15 mL via OROMUCOSAL
  Filled 2024-03-09: qty 15

## 2024-03-09 MED ORDER — LINAGLIPTIN 5 MG PO TABS
5.0000 mg | ORAL_TABLET | Freq: Every day | ORAL | Status: DC
  Administered 2024-03-09: 5 mg via ORAL
  Filled 2024-03-09: qty 1

## 2024-03-09 MED ORDER — PHENYLEPHRINE 80 MCG/ML (10ML) SYRINGE FOR IV PUSH (FOR BLOOD PRESSURE SUPPORT)
PREFILLED_SYRINGE | INTRAVENOUS | Status: AC
  Filled 2024-03-09: qty 10

## 2024-03-09 MED ORDER — FENTANYL CITRATE (PF) 250 MCG/5ML IJ SOLN
INTRAMUSCULAR | Status: AC
  Filled 2024-03-09: qty 5

## 2024-03-09 MED ORDER — OXYCODONE HCL 5 MG PO TABS
ORAL_TABLET | ORAL | Status: AC
Start: 2024-03-09 — End: 2024-03-09
  Filled 2024-03-09: qty 1

## 2024-03-09 MED ORDER — ONDANSETRON HCL 4 MG/2ML IJ SOLN
INTRAMUSCULAR | Status: AC
  Filled 2024-03-09: qty 2

## 2024-03-09 MED ORDER — LIDOCAINE 2% (20 MG/ML) 5 ML SYRINGE
INTRAMUSCULAR | Status: AC
  Filled 2024-03-09: qty 5

## 2024-03-09 MED ORDER — ACETAMINOPHEN 500 MG PO TABS: 1000.0000 mg | ORAL_TABLET | Freq: Every evening | ORAL | Status: DC | PRN

## 2024-03-09 MED ORDER — 0.9 % SODIUM CHLORIDE (POUR BTL) OPTIME
TOPICAL | Status: DC | PRN
  Administered 2024-03-09: 1000 mL

## 2024-03-09 MED ORDER — MENTHOL 3 MG MT LOZG: 1.0000 | LOZENGE | OROMUCOSAL | Status: DC | PRN

## 2024-03-09 MED ORDER — LEVOCETIRIZINE DIHYDROCHLORIDE 5 MG PO TABS: 5.0000 mg | ORAL_TABLET | Freq: Every evening | ORAL | Status: DC

## 2024-03-09 MED ORDER — ACETAMINOPHEN 650 MG RE SUPP: 650.0000 mg | RECTAL | Status: DC | PRN

## 2024-03-09 MED ORDER — DULAGLUTIDE 4.5 MG/0.5ML ~~LOC~~ SOAJ: 4.5000 mg | SUBCUTANEOUS | Status: DC

## 2024-03-09 MED ORDER — ONDANSETRON HCL 4 MG PO TABS: 4.0000 mg | ORAL_TABLET | Freq: Four times a day (QID) | ORAL | Status: DC | PRN

## 2024-03-09 MED ORDER — SODIUM CHLORIDE 0.9% FLUSH: 3.0000 mL | Freq: Two times a day (BID) | INTRAVENOUS | Status: DC

## 2024-03-09 MED ORDER — DEXAMETHASONE SODIUM PHOSPHATE 10 MG/ML IJ SOLN
INTRAMUSCULAR | Status: AC
  Filled 2024-03-09: qty 1

## 2024-03-09 MED ORDER — ATORVASTATIN CALCIUM 10 MG PO TABS: 20.0000 mg | ORAL_TABLET | Freq: Every evening | ORAL | Status: DC

## 2024-03-09 MED ORDER — INSULIN ASPART 100 UNIT/ML IJ SOLN
INTRAMUSCULAR | Status: AC
  Filled 2024-03-09: qty 1

## 2024-03-09 MED ORDER — CYCLOBENZAPRINE HCL 10 MG PO TABS
ORAL_TABLET | ORAL | Status: AC
  Filled 2024-03-09: qty 1

## 2024-03-09 MED ORDER — PROPOFOL 10 MG/ML IV BOLUS
INTRAVENOUS | Status: AC
  Filled 2024-03-09: qty 20

## 2024-03-09 MED ORDER — HYDROCHLOROTHIAZIDE 25 MG PO TABS
25.0000 mg | ORAL_TABLET | Freq: Every morning | ORAL | Status: DC
  Administered 2024-03-09: 25 mg via ORAL
  Filled 2024-03-09: qty 1

## 2024-03-09 MED ORDER — PHENOL 1.4 % MT LIQD: 1.0000 | OROMUCOSAL | Status: DC | PRN

## 2024-03-09 MED ORDER — ACETAMINOPHEN 500 MG PO TABS
1000.0000 mg | ORAL_TABLET | Freq: Once | ORAL | Status: AC
  Administered 2024-03-09: 1000 mg via ORAL
  Filled 2024-03-09: qty 2

## 2024-03-09 MED ORDER — ALUM & MAG HYDROXIDE-SIMETH 200-200-20 MG/5ML PO SUSP: 30.0000 mL | Freq: Four times a day (QID) | ORAL | Status: DC | PRN

## 2024-03-09 MED ORDER — OXYCODONE HCL 5 MG PO TABS
5.0000 mg | ORAL_TABLET | Freq: Once | ORAL | Status: AC
  Administered 2024-03-09: 5 mg via ORAL

## 2024-03-09 MED ORDER — KETOTIFEN FUMARATE 0.035 % OP SOLN: 1.0000 [drp] | Freq: Two times a day (BID) | OPHTHALMIC | Status: DC

## 2024-03-09 MED ORDER — ROCURONIUM BROMIDE 10 MG/ML (PF) SYRINGE
PREFILLED_SYRINGE | INTRAVENOUS | Status: DC | PRN
  Administered 2024-03-09: 50 mg via INTRAVENOUS

## 2024-03-09 MED ORDER — ROCURONIUM BROMIDE 10 MG/ML (PF) SYRINGE
PREFILLED_SYRINGE | INTRAVENOUS | Status: AC
  Filled 2024-03-09: qty 10

## 2024-03-09 MED ORDER — MIDAZOLAM HCL 2 MG/2ML IJ SOLN
INTRAMUSCULAR | Status: DC | PRN
  Administered 2024-03-09: 2 mg via INTRAVENOUS

## 2024-03-09 MED ORDER — SODIUM CHLORIDE 0.9% FLUSH
3.0000 mL | INTRAVENOUS | Status: DC | PRN
Start: 2024-03-09 — End: 2024-03-09

## 2024-03-09 MED ORDER — IBUPROFEN 400 MG PO TABS: 400.0000 mg | ORAL_TABLET | Freq: Every evening | ORAL | Status: DC | PRN

## 2024-03-09 MED ORDER — ALBUTEROL SULFATE HFA 108 (90 BASE) MCG/ACT IN AERS: 1.0000 | INHALATION_SPRAY | Freq: Four times a day (QID) | RESPIRATORY_TRACT | Status: DC | PRN

## 2024-03-09 MED ORDER — IPRATROPIUM BROMIDE 0.06 % NA SOLN: 1.0000 | Freq: Every day | NASAL | Status: DC | PRN

## 2024-03-09 MED ORDER — ASPIRIN 81 MG PO TBEC: 81.0000 mg | DELAYED_RELEASE_TABLET | Freq: Every evening | ORAL | Status: DC

## 2024-03-09 MED ORDER — HYDROCODONE-ACETAMINOPHEN 5-325 MG PO TABS: 1.0000 | ORAL_TABLET | Freq: Four times a day (QID) | ORAL | 0 refills | Status: DC | PRN

## 2024-03-09 SURGICAL SUPPLY — 42 items
BAG COUNTER SPONGE SURGICOUNT (BAG) ×1 IMPLANT
BAND RUBBER #18 3X1/16 STRL (MISCELLANEOUS) ×2 IMPLANT
BENZOIN TINCTURE PRP APPL 2/3 (GAUZE/BANDAGES/DRESSINGS) ×1 IMPLANT
BLADE CLIPPER SURG (BLADE) IMPLANT
BONE FIBERS PLIAFX 10 (Bone Implant) ×1 IMPLANT
BUR MATCHSTICK NEURO 3.0 LAGG (BURR) ×1 IMPLANT
BUR PRECISION FLUTE 6.0 (BURR) IMPLANT
CANISTER SUCTION 3000ML PPV (SUCTIONS) IMPLANT
DERMABOND ADVANCED .7 DNX12 (GAUZE/BANDAGES/DRESSINGS) ×1 IMPLANT
DRAPE HALF SHEET 40X57 (DRAPES) IMPLANT
DRAPE LAPAROTOMY 100X72X124 (DRAPES) ×1 IMPLANT
DRAPE SURG 17X23 STRL (DRAPES) ×1 IMPLANT
ELECTRODE REM PT RTRN 9FT ADLT (ELECTROSURGICAL) ×1 IMPLANT
GAUZE 4X4 16PLY ~~LOC~~+RFID DBL (SPONGE) IMPLANT
GAUZE SPONGE 4X4 12PLY STRL (GAUZE/BANDAGES/DRESSINGS) ×1 IMPLANT
GLOVE BIO SURGEON STRL SZ7 (GLOVE) IMPLANT
GLOVE BIO SURGEON STRL SZ8 (GLOVE) ×1 IMPLANT
GLOVE BIOGEL PI IND STRL 7.0 (GLOVE) IMPLANT
GLOVE EXAM NITRILE XL STR (GLOVE) IMPLANT
GLOVE INDICATOR 8.5 STRL (GLOVE) ×2 IMPLANT
GOWN STRL REUS W/ TWL LRG LVL3 (GOWN DISPOSABLE) ×1 IMPLANT
GOWN STRL REUS W/ TWL XL LVL3 (GOWN DISPOSABLE) ×2 IMPLANT
GOWN STRL REUS W/TWL 2XL LVL3 (GOWN DISPOSABLE) IMPLANT
GRAFT BNE FBR PLIAFX PRIME 10 (Bone Implant) IMPLANT
KIT BASIN OR (CUSTOM PROCEDURE TRAY) ×1 IMPLANT
KIT TURNOVER KIT B (KITS) ×1 IMPLANT
NDL HYPO 22X1.5 SAFETY MO (MISCELLANEOUS) ×1 IMPLANT
NDL SPNL 22GX3.5 QUINCKE BK (NEEDLE) ×1 IMPLANT
NEEDLE HYPO 22X1.5 SAFETY MO (MISCELLANEOUS) ×1 IMPLANT
NEEDLE SPNL 22GX3.5 QUINCKE BK (NEEDLE) ×1 IMPLANT
NS IRRIG 1000ML POUR BTL (IV SOLUTION) ×1 IMPLANT
PACK LAMINECTOMY NEURO (CUSTOM PROCEDURE TRAY) ×1 IMPLANT
RASP 3.0MM (RASP) IMPLANT
SPIKE FLUID TRANSFER (MISCELLANEOUS) ×1 IMPLANT
SPONGE SURGIFOAM ABS GEL SZ50 (HEMOSTASIS) ×1 IMPLANT
STRIP CLOSURE SKIN 1/2X4 (GAUZE/BANDAGES/DRESSINGS) ×1 IMPLANT
SUT VIC AB 0 CT1 18XCR BRD8 (SUTURE) ×1 IMPLANT
SUT VIC AB 2-0 CT1 18 (SUTURE) ×1 IMPLANT
SUT VIC AB 4-0 PS2 18 (SUTURE) ×1 IMPLANT
TOWEL GREEN STERILE (TOWEL DISPOSABLE) ×1 IMPLANT
TOWEL GREEN STERILE FF (TOWEL DISPOSABLE) ×1 IMPLANT
WATER STERILE IRR 1000ML POUR (IV SOLUTION) ×1 IMPLANT

## 2024-03-09 NOTE — H&P (Signed)
 Shari Hill is an 68 y.o. female.   Chief Complaint: Bilateral back pain left greater than right HPI: 68 year old female previously undergone L3-L5 fusion and has had some persistent left-sided back pain with some occasional radiation into her leg workup is revealed haloing around the left L3 screw possible at least partial fusion at L3-4 due to the patient progression of clinical syndrome imaging findings and failed conservative treatment I have recommended removal of the loose L3 screw evaluate the fusion possible in situ arthrodesis but no replacement of any additional instrumentation.  Have extensively gone over the risks and benefits of that procedure with her as well as perioperative course expectations of outcome and alternatives of surgery and she understands and agrees to proceed forward.  Past Medical History:  Diagnosis Date   Arthritis    Asthma    Diabetes mellitus without complication (HCC)    Difficult intubation 05/19/2011   Unsuccessfully used Mac 3, Glidescope 4, Glydescope 3. Successfully used Fiberoptic scope   Hyperlipidemia    Hypertension    Multinodular thyroid     Nodule of left lung    PONV (postoperative nausea and vomiting)    Rosai-Dorfman disease (HCC)    Sleep apnea     Past Surgical History:  Procedure Laterality Date   APPENDECTOMY     CHOLECYSTECTOMY     EXPLORATORY LAPAROTOMY     MEDIAL PARTIAL KNEE REPLACEMENT  09/05/2020   NASAL SINUS SURGERY  2005   ROTATOR CUFF REPAIR Left     History reviewed. No pertinent family history. Social History:  reports that she has never smoked. She has never used smokeless tobacco. She reports that she does not currently use alcohol after a past usage of about 1.0 standard drink of alcohol per week. She reports that she does not use drugs.  Allergies:  Allergies  Allergen Reactions   Gabapentin Other (See Comments)    Raised blood sugar    Levofloxacin     Bilateral calf pain   Zocor [Simvastatin] Other (See  Comments)    Unknown reaction per pt    Latex Hives and Rash   Other Hives, Itching, Swelling and Rash    Dissolvable stiches     Medications Prior to Admission  Medication Sig Dispense Refill   acetaminophen  (TYLENOL ) 500 MG tablet Take 1,000 mg by mouth at bedtime as needed for moderate pain (pain score 4-6).     albuterol  (VENTOLIN  HFA) 108 (90 Base) MCG/ACT inhaler Inhale 1-2 puffs into the lungs every 6 (six) hours as needed for wheezing or shortness of breath.     amLODipine  (NORVASC ) 10 MG tablet Take 10 mg by mouth every evening.     aspirin  EC 81 MG tablet Take 81 mg by mouth every evening. Swallow whole.     atorvastatin  (LIPITOR) 20 MG tablet Take 20 mg by mouth every evening.     azelastine  (OPTIVAR ) 0.05 % ophthalmic solution Place 1 drop into both eyes 2 (two) times daily as needed (allergies).     budesonide-formoterol  (SYMBICORT) 160-4.5 MCG/ACT inhaler Inhale 2 puffs into the lungs 2 (two) times daily as needed (respiratory issues.).     Dulaglutide (TRULICITY) 4.5 MG/0.5ML SOAJ Inject 4.5 mg as directed every Saturday.     EPINEPHrine  0.3 mg/0.3 mL IJ SOAJ injection Inject 0.3 mg into the muscle as needed for anaphylaxis.     hydrochlorothiazide  (HYDRODIURIL ) 25 MG tablet Take 25 mg by mouth in the morning.     ibuprofen (ADVIL) 200 MG tablet Take  400 mg by mouth at bedtime as needed for moderate pain (pain score 4-6).     ipratropium (ATROVENT) 0.06 % nasal spray Place 1 spray into both nostrils daily as needed for rhinitis.     levocetirizine (XYZAL ) 5 MG tablet Take 5 mg by mouth every evening.     lisinopril  (ZESTRIL ) 40 MG tablet Take 40 mg by mouth in the morning.     metoprolol  succinate (TOPROL -XL) 50 MG 24 hr tablet Take 50 mg by mouth every evening.     sitaGLIPtin  (JANUVIA ) 100 MG tablet Take 100 mg by mouth daily.      Results for orders placed or performed during the hospital encounter of 03/09/24 (from the past 48 hours)  Glucose, capillary     Status:  Abnormal   Collection Time: 03/09/24  7:52 AM  Result Value Ref Range   Glucose-Capillary 185 (H) 70 - 99 mg/dL    Comment: Glucose reference range applies only to samples taken after fasting for at least 8 hours.   Comment 1 Notify RN    No results found.  Review of Systems  Musculoskeletal:  Positive for back pain.  Neurological:  Positive for numbness.    Blood pressure 129/69, pulse 76, temperature 98.8 F (37.1 C), temperature source Oral, resp. rate 18, height 5' 3 (1.6 m), weight 103 kg, SpO2 98%. Physical Exam HENT:     Head: Normocephalic.     Right Ear: Tympanic membrane normal.     Nose: Nose normal.     Mouth/Throat:     Mouth: Mucous membranes are moist.  Cardiovascular:     Rate and Rhythm: Normal rate.     Pulses: Normal pulses.  Pulmonary:     Effort: Pulmonary effort is normal.  Musculoskeletal:     Cervical back: Normal range of motion.  Neurological:     Mental Status: She is alert.     Comments: Patient is awake and alert strength is 5 out of 5 iliopsoas, quads, hamstrings, gastroc, and tibialis, and EHL.      Assessment/Plan 68 year old female presents for expiration fusion and removal of loose L3 screw  Shari SHAUNNA [name], MD 03/09/2024, 9:50 AM

## 2024-03-09 NOTE — Anesthesia Postprocedure Evaluation (Signed)
 Anesthesia Post Note  Patient: Shari Hill  Procedure(s) Performed: REMOVAL HARDWARE LEFT LUMBAR 3 SCREW, POSTERIOLATERAL ARTHRODESIS LUMBAR 3-4 (Back)     Patient location during evaluation: PACU Anesthesia Type: General Level of consciousness: oriented, patient cooperative and sedated Pain management: pain level controlled Vital Signs Assessment: post-procedure vital signs reviewed and stable Respiratory status: spontaneous breathing, nonlabored ventilation and respiratory function stable Cardiovascular status: blood pressure returned to baseline and stable Postop Assessment: no apparent nausea or vomiting Anesthetic complications: no   There were no known notable events for this encounter.  Last Vitals:  Vitals:   03/09/24 1129 03/09/24 1130  BP: 125/69 127/78  Pulse: 84 85  Resp: (!) 22 (!) 27  Temp: 37 C 36.4 C  SpO2: 94% 95%    Last Pain:  Vitals:   03/09/24 1145  TempSrc:   PainSc: 0-No pain                 Danette Weinfeld,E. Jarmal Lewelling

## 2024-03-09 NOTE — Progress Notes (Signed)
 Patient has ambulated twice around the PA CU with minimal discomfort; voided in bathroom and was able to east 90% of her lunch. Patient states she is comfortable to be discharged home.

## 2024-03-09 NOTE — Anesthesia Procedure Notes (Signed)
 Procedure Name: Intubation Date/Time: 03/09/2024 10:06 AM  Performed by: Leonce Athens, MDPre-anesthesia Checklist: Patient identified, Emergency Drugs available, Suction available, Patient being monitored and Timeout performed Patient Re-evaluated:Patient Re-evaluated prior to induction Oxygen Delivery Method: Circle system utilized Preoxygenation: Pre-oxygenation with 100% oxygen Induction Type: IV induction Ventilation: Mask ventilation without difficulty Laryngoscope Size: Glidescope and 4 Grade View: Grade I Tube type: Parker flex tip Tube size: 7.0 mm Number of attempts: 1 Placement Confirmation: ETT inserted through vocal cords under direct vision, positive ETCO2 and breath sounds checked- equal and bilateral Secured at: 23 cm Tube secured with: Tape Dental Injury: Teeth and Oropharynx as per pre-operative assessment  Difficulty Due To: Difficulty was anticipated, Difficult Airway- due to anterior larynx, Difficult Airway- due to large tongue, Difficult Airway- due to limited oral opening and Difficult Airway- due to reduced neck mobility Comments: Easy VideoGlide intubation

## 2024-03-09 NOTE — Progress Notes (Signed)
 Discharge orders done. Patient must ambulate and void before being sent home

## 2024-03-09 NOTE — Transfer of Care (Signed)
 Immediate Anesthesia Transfer of Care Note  Patient: Shari Hill  Procedure(s) Performed: REMOVAL HARDWARE LEFT LUMBAR 3 SCREW, POSTERIOLATERAL ARTHRODESIS LUMBAR 3-4 (Back)  Patient Location: PACU  Anesthesia Type:General  Level of Consciousness: awake and alert   Airway & Oxygen Therapy: Patient Spontanous Breathing  Post-op Assessment: Report given to RN, Post -op Vital signs reviewed and stable, and Patient moving all extremities X 4  Post vital signs: Reviewed and stable  Last Vitals:  Vitals Value Taken Time  BP 125/69 03/09/24 11:29  Temp 37 C 03/09/24 11:29  Pulse 85 03/09/24 11:29  Resp 27 03/09/24 11:29  SpO2 89 % 03/09/24 11:29  Vitals shown include unfiled device data.  Last Pain:  Vitals:   03/09/24 1129  TempSrc: Tympanic  PainSc:       Patients Stated Pain Goal: 3 (03/09/24 0817)  Complications: There were no known notable events for this encounter.

## 2024-03-09 NOTE — Discharge Instructions (Signed)
 Wound Care Keep incision covered and dry until post op day 3. You may remove the Honeycomb dressing on post op day 3. Leave steri-strips on back.  They will fall off by themselves. Do not put any creams, lotions, or ointments on incision. You are fine to shower. Let water run over incision and pat dry.   Activity Walk each and every day, increasing distance each day. No lifting greater than 8 lbs.  No lifting no bending no twisting no driving or riding a car unless you are coming to see me If provided with back brace, wear when out of bed.  It is not necessary to wear brace in bed.  Diet Resume your normal diet.   Return to Work Will be discussed at your follow up appointment.  Call Your Doctor If Any of These Occur Redness, drainage, or swelling at the wound.  Temperature greater than 101 degrees. Severe pain not relieved by pain medication. Incision starts to come apart.  Follow Up Appt Call [phone] if you have one or any problem.

## 2024-03-09 NOTE — Op Note (Signed)
 Pre-operative diagnosis: Loose left L3 screw possible pseudoarthrosis.  Postoperative diagnosis: Same.  Procedure: Reexploration of lumbar fusion with removal of loose to L3 screw on the left by cutting the rod above the L4 screw removal of the L3 screw.  2.  Redo posterolateral arthrodesis L3-4 on the left utilizing cortical fibers and locally harvested autograft.  Surgeon: Arley helling.  Anesthesia: General.  EBL: Minimal.  HPI: 68 year old female previous history of L3-S1 fusion had progressive worsening left-sided back pain greater than right workup revealed loosening of her left L3 screw possible pseudoarthrosis at L3-4 but also appeared to be some elements of progressive posterolateral arthrodesis and interbody arthrodesis at all levels.  So I recommended reexploration fusion removal of L3 screw inspection fusion possible redo posterolateral arthrodesis at that level.  I extensively reviewed the risks and benefits of the operation with her as well as perioperative course expectations of outcome and alternatives to surgery and she understood and agreed to proceed forward.  Operative procedure: Patient was brought into the OR was induced under general anesthesia positioned prone the Wilson frame her back was prepped and draped in routine sterile fashion her old incision was infiltrated with 10 cc of lidocaine  with epi midline incision was made above electrocautery was used to dissect through the scar tissue immediately came down on the right sided hardware first so working towards the left identified the left skived the hardware identified the L3-L4 screw dissected the screws dissected the lateral facet joints and TPs out at L3 and L4 I then cut the rod above the L4 screw and removed the L3 screw which was loose.  There was a break in the posterolateral arthrodesis at that level and the fusion did appear to move as a unit however I did aggressively decorticated across that break in the facet joint  and decorticated the TPs at L3 and L4 and laid some additional cortical fibers along as well as local harvested autograft posterior laterally.  Then I injected Exparel  in the fascia and closed the wound in layers with interrupted Vicryl and a running 4 subcuticular.  Dermabond benzoin Steri-Strips and a sterile dressing was applied patient to cover him in stable condition.  At the end the case all needle count sponge counts were correct.

## 2024-03-12 ENCOUNTER — Encounter (HOSPITAL_COMMUNITY): Payer: Self-pay | Admitting: Neurosurgery

## 2024-03-14 NOTE — Discharge Summary (Signed)
 Physician Discharge Summary  Patient ID: Shari Hill MRN: [id number] DOB/AGE: Jan 08, 1956 68 y.o.  Admit date: 03/09/2024 Discharge date: 03/14/2024  Admission Diagnoses: Loose left L3 screw possible pseudoarthrosis.     Discharge Diagnoses: same   Discharged Condition: good  Hospital Course: The patient was admitted on 03/09/2024 and taken to the operating room where the patient underwent L3 screw removal with redo posterior lateral fusion L3-4. The patient tolerated the procedure well and was taken to the recovery room and then to the floor in stable condition. The hospital course was routine. There were no complications. The wound remained clean dry and intact. Pt had appropriate back soreness. No complaints of leg pain or new N/T/W. The patient remained afebrile with stable vital signs, and tolerated a regular diet. The patient continued to increase activities, and pain was well controlled with oral pain medications.   Consults: None  Significant Diagnostic Studies:  Results for orders placed or performed during the hospital encounter of 03/09/24  Glucose, capillary   Collection Time: 03/09/24  7:52 AM  Result Value Ref Range   Glucose-Capillary 185 (H) 70 - 99 mg/dL   Comment 1 Notify RN   Glucose, capillary   Collection Time: 03/09/24 11:26 AM  Result Value Ref Range   Glucose-Capillary 128 (H) 70 - 99 mg/dL   Comment 1 Notify RN    Comment 2 Document in Chart     No results found.  Antibiotics:  Anti-infectives (From admission, onward)    Start     Dose/Rate Route Frequency Ordered Stop   03/09/24 1200  ceFAZolin  (ANCEF ) IVPB 2g/100 mL premix  Status:  Discontinued        2 g 200 mL/hr over 30 Minutes Intravenous Every 8 hours 03/09/24 1158 03/09/24 2023   03/09/24 0800  ceFAZolin  (ANCEF ) IVPB 2g/100 mL premix        2 g 200 mL/hr over 30 Minutes Intravenous On call to O.R. 03/09/24 0746 03/09/24 1008       Discharge Exam: Blood pressure 128/71, pulse 84,  temperature 97.6 F (36.4 C), resp. rate 19, height 5' 3 (1.6 m), weight 103 kg, SpO2 92%. Neurologic: Grossly normal Ambulatign and voiding well incision cdi   Discharge Medications:   Allergies as of 03/09/2024       Reactions   Gabapentin Other (See Comments)   Raised blood sugar    Levofloxacin    Bilateral calf pain   Zocor [simvastatin] Other (See Comments)   Unknown reaction per pt   Latex Hives, Rash   Other Hives, Itching, Swelling, Rash   Dissolvable stiches         Medication List     TAKE these medications    acetaminophen  500 MG tablet Commonly known as: TYLENOL  Take 1,000 mg by mouth at bedtime as needed for moderate pain (pain score 4-6).   albuterol  108 (90 Base) MCG/ACT inhaler Commonly known as: VENTOLIN  HFA Inhale 1-2 puffs into the lungs every 6 (six) hours as needed for wheezing or shortness of breath.   amLODipine  10 MG tablet Commonly known as: NORVASC  Take 10 mg by mouth every evening.   aspirin  EC 81 MG tablet Take 81 mg by mouth every evening. Swallow whole.   atorvastatin  20 MG tablet Commonly known as: LIPITOR Take 20 mg by mouth every evening.   azelastine  0.05 % ophthalmic solution Commonly known as: OPTIVAR  Place 1 drop into both eyes 2 (two) times daily as needed (allergies).   budesonide-formoterol  160-4.5 MCG/ACT inhaler Commonly  known as: SYMBICORT Inhale 2 puffs into the lungs 2 (two) times daily as needed (respiratory issues.).   cyclobenzaprine  10 MG tablet Commonly known as: FLEXERIL  Take 1 tablet (10 mg total) by mouth 3 (three) times daily as needed for muscle spasms.   EPINEPHrine  0.3 mg/0.3 mL Soaj injection Commonly known as: EPI-PEN Inject 0.3 mg into the muscle as needed for anaphylaxis.   hydrochlorothiazide  25 MG tablet Commonly known as: HYDRODIURIL  Take 25 mg by mouth in the morning.   HYDROcodone -acetaminophen  5-325 MG tablet Commonly known as: NORCO/VICODIN Take 1 tablet by mouth every 6 (six)  hours as needed for severe pain (pain score 7-10).   ibuprofen  200 MG tablet Commonly known as: ADVIL  Take 400 mg by mouth at bedtime as needed for moderate pain (pain score 4-6).   ipratropium 0.06 % nasal spray Commonly known as: ATROVENT  Place 1 spray into both nostrils daily as needed for rhinitis.   levocetirizine 5 MG tablet Commonly known as: XYZAL  Take 5 mg by mouth every evening.   lisinopril  40 MG tablet Commonly known as: ZESTRIL  Take 40 mg by mouth in the morning.   metoprolol  succinate 50 MG 24 hr tablet Commonly known as: TOPROL -XL Take 50 mg by mouth every evening.   sitaGLIPtin  100 MG tablet Commonly known as: JANUVIA  Take 100 mg by mouth daily.   Trulicity  4.5 MG/0.5ML Soaj Generic drug: Dulaglutide  Inject 4.5 mg as directed every Saturday.        Disposition: home   Final Dx: removal left L3 screw with redo posterior lateral fusion L3-4  Discharge Instructions      Remove dressing in 72 hours   Complete by: As directed    Call MD for:  difficulty breathing, headache or visual disturbances   Complete by: As directed    Call MD for:  hives   Complete by: As directed    Call MD for:  persistant dizziness or light-headedness   Complete by: As directed    Call MD for:  persistant nausea and vomiting   Complete by: As directed    Call MD for:  redness, tenderness, or signs of infection (pain, swelling, redness, odor or green/yellow discharge around incision site)   Complete by: As directed    Call MD for:  severe uncontrolled pain   Complete by: As directed    Call MD for:  temperature >100.4   Complete by: As directed    Diet - low sodium heart healthy   Complete by: As directed    Increase activity slowly   Complete by: As directed         Follow-up Information     Onetha Kuba, MD. Call.   Specialty: Neurosurgery Why: As needed, If symptoms worsen Contact information: [address] [place]  [address] [phone]                  Signed: Suzen Lacks [facility] 03/14/2024, 1:00 PM

## 2024-03-16 ENCOUNTER — Other Ambulatory Visit: Payer: Self-pay

## 2024-03-19 ENCOUNTER — Telehealth: Payer: Self-pay | Admitting: Oncology

## 2024-03-19 NOTE — Telephone Encounter (Signed)
 Called PT to reschedule 8/6 appointment per secure chat. Appt. Rescheduled.

## 2024-03-20 ENCOUNTER — Other Ambulatory Visit: Payer: Self-pay

## 2024-03-24 ENCOUNTER — Encounter: Payer: Self-pay | Admitting: Oncology

## 2024-03-25 ENCOUNTER — Other Ambulatory Visit: Payer: Self-pay | Admitting: Oncology

## 2024-03-28 ENCOUNTER — Other Ambulatory Visit

## 2024-03-28 ENCOUNTER — Ambulatory Visit: Admitting: Oncology

## 2024-03-28 ENCOUNTER — Ambulatory Visit

## 2024-03-29 ENCOUNTER — Inpatient Hospital Stay

## 2024-03-29 ENCOUNTER — Inpatient Hospital Stay: Attending: Oncology

## 2024-03-29 ENCOUNTER — Encounter: Payer: Self-pay | Admitting: Nurse Practitioner

## 2024-03-29 ENCOUNTER — Inpatient Hospital Stay (HOSPITAL_BASED_OUTPATIENT_CLINIC_OR_DEPARTMENT_OTHER): Admitting: Nurse Practitioner

## 2024-03-29 VITALS — BP 115/68 | HR 72 | Temp 97.9°F | Resp 18

## 2024-03-29 VITALS — BP 122/70 | HR 74 | Temp 98.1°F | Resp 18 | Ht 63.0 in | Wt 227.1 lb

## 2024-03-29 DIAGNOSIS — Z7960 Long term (current) use of unspecified immunomodulators and immunosuppressants: Secondary | ICD-10-CM | POA: Insufficient documentation

## 2024-03-29 DIAGNOSIS — D763 Other histiocytosis syndromes: Secondary | ICD-10-CM | POA: Diagnosis present

## 2024-03-29 DIAGNOSIS — E785 Hyperlipidemia, unspecified: Secondary | ICD-10-CM | POA: Insufficient documentation

## 2024-03-29 DIAGNOSIS — L989 Disorder of the skin and subcutaneous tissue, unspecified: Secondary | ICD-10-CM | POA: Diagnosis not present

## 2024-03-29 DIAGNOSIS — Z5112 Encounter for antineoplastic immunotherapy: Secondary | ICD-10-CM | POA: Insufficient documentation

## 2024-03-29 DIAGNOSIS — E042 Nontoxic multinodular goiter: Secondary | ICD-10-CM | POA: Diagnosis not present

## 2024-03-29 DIAGNOSIS — E119 Type 2 diabetes mellitus without complications: Secondary | ICD-10-CM | POA: Diagnosis not present

## 2024-03-29 DIAGNOSIS — I10 Essential (primary) hypertension: Secondary | ICD-10-CM | POA: Diagnosis not present

## 2024-03-29 DIAGNOSIS — R911 Solitary pulmonary nodule: Secondary | ICD-10-CM | POA: Diagnosis not present

## 2024-03-29 DIAGNOSIS — J45909 Unspecified asthma, uncomplicated: Secondary | ICD-10-CM | POA: Insufficient documentation

## 2024-03-29 LAB — CMP (CANCER CENTER ONLY)
ALT: 11 U/L (ref 0–44)
AST: 13 U/L — ABNORMAL LOW (ref 15–41)
Albumin: 4.2 g/dL (ref 3.5–5.0)
Alkaline Phosphatase: 135 U/L — ABNORMAL HIGH (ref 38–126)
Anion gap: 13 (ref 5–15)
BUN: 16 mg/dL (ref 8–23)
CO2: 24 mmol/L (ref 22–32)
Calcium: 9.9 mg/dL (ref 8.9–10.3)
Chloride: 102 mmol/L (ref 98–111)
Creatinine: 1.03 mg/dL — ABNORMAL HIGH (ref 0.44–1.00)
GFR, Estimated: 59 mL/min — ABNORMAL LOW (ref >60)
Glucose, Bld: 263 mg/dL — ABNORMAL HIGH (ref 70–99)
Potassium: 3.8 mmol/L (ref 3.5–5.1)
Sodium: 138 mmol/L (ref 135–145)
Total Bilirubin: 0.3 mg/dL (ref 0.0–1.2)
Total Protein: 6.7 g/dL (ref 6.5–8.1)

## 2024-03-29 LAB — CBC WITH DIFFERENTIAL (CANCER CENTER ONLY)
Abs Immature Granulocytes: 0.03 K/uL (ref 0.00–0.07)
Basophils Absolute: 0.1 K/uL (ref 0.0–0.1)
Basophils Relative: 1 %
Eosinophils Absolute: 0.3 K/uL (ref 0.0–0.5)
Eosinophils Relative: 3 %
HCT: 37.7 % (ref 36.0–46.0)
Hemoglobin: 12.4 g/dL (ref 12.0–15.0)
Immature Granulocytes: 0 %
Lymphocytes Relative: 22 %
Lymphs Abs: 1.9 K/uL (ref 0.7–4.0)
MCH: 28.1 pg (ref 26.0–34.0)
MCHC: 32.9 g/dL (ref 30.0–36.0)
MCV: 85.5 fL (ref 80.0–100.0)
Monocytes Absolute: 0.7 K/uL (ref 0.1–1.0)
Monocytes Relative: 8 %
Neutro Abs: 5.8 K/uL (ref 1.7–7.7)
Neutrophils Relative %: 66 %
Platelet Count: 171 K/uL (ref 150–400)
RBC: 4.41 MIL/uL (ref 3.87–5.11)
RDW: 15 % (ref 11.5–15.5)
WBC Count: 8.7 K/uL (ref 4.0–10.5)
nRBC: 0 % (ref 0.0–0.2)

## 2024-03-29 MED ORDER — SODIUM CHLORIDE 0.9 % IV SOLN
Freq: Once | INTRAVENOUS | Status: AC
Start: 2024-03-29 — End: 2024-03-29

## 2024-03-29 MED ORDER — PROCHLORPERAZINE MALEATE 10 MG PO TABS
10.0000 mg | ORAL_TABLET | Freq: Four times a day (QID) | ORAL | Status: DC | PRN
  Administered 2024-03-29: 10 mg via ORAL
  Filled 2024-03-29: qty 1

## 2024-03-29 MED ORDER — ACETAMINOPHEN 325 MG PO TABS
650.0000 mg | ORAL_TABLET | Freq: Once | ORAL | Status: AC
  Administered 2024-03-29: 650 mg via ORAL
  Filled 2024-03-29: qty 2

## 2024-03-29 MED ORDER — FAMOTIDINE IN NACL 20-0.9 MG/50ML-% IV SOLN
20.0000 mg | Freq: Once | INTRAVENOUS | Status: AC
  Administered 2024-03-29: 20 mg via INTRAVENOUS
  Filled 2024-03-29: qty 50

## 2024-03-29 MED ORDER — DIPHENHYDRAMINE HCL 25 MG PO CAPS
50.0000 mg | ORAL_CAPSULE | Freq: Once | ORAL | Status: AC
  Administered 2024-03-29: 50 mg via ORAL
  Filled 2024-03-29: qty 2

## 2024-03-29 MED ORDER — SODIUM CHLORIDE 0.9 % IV SOLN
375.0000 mg/m2 | Freq: Once | INTRAVENOUS | Status: AC
  Administered 2024-03-29: 800 mg via INTRAVENOUS
  Filled 2024-03-29: qty 50

## 2024-03-29 NOTE — Progress Notes (Signed)
 [facility] OFFICE PROGRESS NOTE   Diagnosis: Rosai-Dorfman disease  INTERVAL HISTORY:   Shari Hill returns as scheduled.  She completed another cycle of rituximab  12/28/2023.  She denies signs of a reaction.  No fever or chills.  Skin lesions are stable.  No new lesions.  She underwent removal of a loose L3 screw on the left and redo posterolateral arthrodesis L3-4 on the left on 03/09/2024.  Objective:  Vital signs in last 24 hours:  Blood pressure 122/70, pulse 74, temperature 98.1 F (36.7 C), temperature source Temporal, resp. rate 18, height 5' 3 (1.6 m), weight 227 lb 1.6 oz (103 kg), SpO2 100%.    HEENT: No thrush or ulcers. Resp: Lungs clear bilaterally. Cardio: Regular rate and rhythm. GI: Abdomen soft.  Mild tenderness left upper abdomen.  No hepatosplenomegaly. Vascular: No leg edema. Neuro: Alert and oriented. Skin: Approximate 2 cm flat faintly hyperpigmented lesion left posterior upper arm.  Approximate 5 cm flat hyperpigmented lesion with scattered areas of hypopigmentation left buttock.  2 cm hyperpigmented lesion at the left anterior thigh, slightly raised at the lateral edge.  No new skin lesions noted.  Lab Results:  Lab Results  Component Value Date   WBC 8.7 03/29/2024   HGB 12.4 03/29/2024   HCT 37.7 03/29/2024   MCV 85.5 03/29/2024   PLT 171 03/29/2024   NEUTROABS 5.8 03/29/2024    Imaging:  No results found.  Medications: I have reviewed the patient's current medications.  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 PET October 2013-numerous FDG avid subdermal/subcutaneous nodules and bone lesions Prednisone October 2013 with initial response, tapered until February 2014 when skin lesions enlarged Rituximab  4 weekly doses beginning, followed by rituximab  maintenance with completion of 2 years January 2016 Disease progression July 2019-no response to rituximab  or  Gleevec Revlimid/Decadron  August 2019-partial response 01/28/2022-enlarging cutaneous lesion left anterior thigh, possible small similar lesions at the left abdomen and left lower leg PET 03/11/2022-heterogenous mildly enlarged right thyroid  with calcification, cutaneous left anterior thigh lesion with moderate metabolic activity, cutaneous lesion at the mid anterior chest-nonspecific, left upper lobe pulmonary nodule with low-level FDG activity less than mediastinal blood pool-nonspecific Thalidomide  beginning 09/13/2022 Thalidomide  discontinued 12/06/2022 secondary to apparent toxicities (malaise, headaches,1 weight gain (and no regression of the cutaneous lesions) PET scan 01/21/2023-intense hypermetabolic subcutaneous nodular thickening in the posterior left buttock region; similar metabolic cutaneous lesion anterior left thigh; intense metabolic activity lateral to the right lateral femoral condyle; intense radiotracer activity in the dorsum of the left foot superficial to the proximal third phalanx 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  Stable left upper lobe pulmonary nodule with very low metabolic activity. Methotrexate , 10 mg weekly, beginning 02/07/2023, last dose 04/04/2023 Methotrexate  discontinued secondary to development of a new lesion at the left upper arm and lack of a Cycle 1 weekly Rituxan  04/20/2023 Cycle 2 weekly Rituxan  04/27/2023 Cycle 3 weekly rituximab  05/04/2023 Cycle 4 weekly rituximab  05/12/2023 PET scan 09/26/2023-pending Rituximab  every 3 months x 1 year beginning 10/05/2023 Diabetes Hypertension Hyperlipidemia Asthma/seasonal allergies Left lung nodule on PET 03/11/2022 CT chest 07/12/2022-unchanged left upper lobe nodule, no new suspicious nodules or masses heterogenous enlargement of the thyroid  7.  Multinodular thyroid -2 right-sided nodules meet criteria for biopsy and a calcified nodule in the right mid thyroid  meets criteria for surveillance imaging 8.  L5-S1 decompressive  laminotomies 1/5    Disposition: Shari Hill appears stable.  She is tolerating Rituxan  well.  The skin lesions are without  significant change and there are no new lesions.  Plan to proceed with Rituxan  today as scheduled.  CBC and chemistry panel reviewed.  Labs adequate for treatment.  She will return for follow-up and the next cycle of Rituxan  in 3 months.  We are available to see her sooner if needed.    Olam Ned ANP/GNP-BC   03/29/2024  10:37 AM

## 2024-03-29 NOTE — Progress Notes (Signed)
 Patient seen by Lonna Cobb NP today  Vitals are within treatment parameters:Yes   Labs are within treatment parameters: Yes   Treatment plan has been signed: Yes   Per physician team, Patient is ready for treatment and there are NO modifications to the treatment plan.

## 2024-03-29 NOTE — Patient Instructions (Signed)
 [facility]  Discharge Instructions: Thank you for choosing [facility] to provide your oncology and hematology care.   If you have a lab appointment with the Cancer Center, please go directly to the Cancer Center and check in at the registration area.   Wear comfortable clothing and clothing appropriate for easy access to any Portacath or PICC line.   We strive to give you quality time with your provider. You may need to reschedule your appointment if you arrive late (15 or more minutes).  Arriving late affects you and other patients whose appointments are after yours.  Also, if you miss three or more appointments without notifying the office, you may be dismissed from the clinic at the provider's discretion.      For prescription refill requests, have your pharmacy contact our office and allow 72 hours for refills to be completed.    Today you received the following chemotherapy and/or immunotherapy agents Rituxan      To help prevent nausea and vomiting after your treatment, we encourage you to take your nausea medication as directed.  BELOW ARE SYMPTOMS THAT SHOULD BE REPORTED IMMEDIATELY: *FEVER GREATER THAN 100.4 F (38 C) OR HIGHER *CHILLS OR SWEATING *NAUSEA AND VOMITING THAT IS NOT CONTROLLED WITH YOUR NAUSEA MEDICATION *UNUSUAL SHORTNESS OF BREATH *UNUSUAL BRUISING OR BLEEDING *URINARY PROBLEMS (pain or burning when urinating, or frequent urination) *BOWEL PROBLEMS (unusual diarrhea, constipation, pain near the anus) TENDERNESS IN MOUTH AND THROAT WITH OR WITHOUT PRESENCE OF ULCERS (sore throat, sores in mouth, or a toothache) UNUSUAL RASH, SWELLING OR PAIN  UNUSUAL VAGINAL DISCHARGE OR ITCHING   Items with * indicate a potential emergency and should be followed up as soon as possible or go to the Emergency Department if any problems should occur.  Please show the CHEMOTHERAPY ALERT CARD or IMMUNOTHERAPY  ALERT CARD at check-in to the Emergency Department and triage nurse.  Should you have questions after your visit or need to cancel or reschedule your appointment, please contact [facility]  Dept: [phone]  and follow the prompts.  Office hours are 8:00 a.m. to 4:30 p.m. Monday - Friday. Please note that voicemails left after 4:00 p.m. may not be returned until the following business day.  We are closed weekends and major holidays. You have access to a nurse at all times for urgent questions. Please call the main number to the clinic Dept: [phone] and follow the prompts.   For any non-urgent questions, you may also contact your provider using MyChart. We now offer e-Visits for anyone 36 and older to request care online for non-urgent symptoms. For details visit [email or web address].   Also download the MyChart app! Go to the app store, search "MyChart", open the app, select [place], and log in with your MyChart username and password.

## 2024-03-30 ENCOUNTER — Other Ambulatory Visit: Payer: Self-pay

## 2024-03-31 ENCOUNTER — Other Ambulatory Visit: Payer: Self-pay

## 2024-04-19 ENCOUNTER — Other Ambulatory Visit: Payer: Self-pay | Admitting: Neurosurgery

## 2024-04-19 DIAGNOSIS — S32009K Unspecified fracture of unspecified lumbar vertebra, subsequent encounter for fracture with nonunion: Secondary | ICD-10-CM

## 2024-04-22 ENCOUNTER — Other Ambulatory Visit: Payer: Self-pay

## 2024-05-01 ENCOUNTER — Encounter: Payer: Self-pay | Admitting: Oncology

## 2024-05-01 ENCOUNTER — Ambulatory Visit
Admission: RE | Admit: 2024-05-01 | Discharge: 2024-05-01 | Disposition: A | Discharge status: Home / Self Care | Source: Ambulatory Visit | Attending: Neurosurgery | Admitting: Neurosurgery

## 2024-05-01 DIAGNOSIS — S32009K Unspecified fracture of unspecified lumbar vertebra, subsequent encounter for fracture with nonunion: Secondary | ICD-10-CM

## 2024-05-14 ENCOUNTER — Telehealth: Payer: Self-pay

## 2024-05-14 NOTE — Telephone Encounter (Signed)
-----   Message from Olam Ned sent at 05/11/2024  4:10 PM EDT ----- Appointment 06/21/2024 was supposed to be with Dr. Cloretta.  Please move to his schedule if there is availability.  Thanks.

## 2024-05-14 NOTE — Telephone Encounter (Signed)
 The patient was rescheduled to Sherrill's appointment schedule, and a reminder letter regarding the updated appointment was sent out.

## 2024-05-16 ENCOUNTER — Encounter: Payer: Self-pay | Admitting: Oncology

## 2024-05-16 NOTE — Telephone Encounter (Signed)
Printed note for MD

## 2024-05-22 ENCOUNTER — Other Ambulatory Visit (HOSPITAL_BASED_OUTPATIENT_CLINIC_OR_DEPARTMENT_OTHER): Payer: Self-pay | Admitting: Family Medicine

## 2024-05-22 DIAGNOSIS — Z780 Asymptomatic menopausal state: Secondary | ICD-10-CM

## 2024-05-29 ENCOUNTER — Other Ambulatory Visit: Payer: Self-pay

## 2024-06-12 ENCOUNTER — Other Ambulatory Visit: Payer: Self-pay | Admitting: Family Medicine

## 2024-06-12 DIAGNOSIS — R928 Other abnormal and inconclusive findings on diagnostic imaging of breast: Secondary | ICD-10-CM

## 2024-06-14 ENCOUNTER — Encounter: Payer: Self-pay | Admitting: Oncology

## 2024-06-15 ENCOUNTER — Other Ambulatory Visit: Payer: Self-pay | Admitting: Oncology

## 2024-06-17 ENCOUNTER — Encounter: Payer: Self-pay | Admitting: Oncology

## 2024-06-21 ENCOUNTER — Ambulatory Visit: Admitting: Nurse Practitioner

## 2024-06-21 ENCOUNTER — Ambulatory Visit

## 2024-06-21 ENCOUNTER — Inpatient Hospital Stay: Attending: Oncology

## 2024-06-21 ENCOUNTER — Inpatient Hospital Stay

## 2024-06-21 ENCOUNTER — Inpatient Hospital Stay: Admitting: Oncology

## 2024-06-21 ENCOUNTER — Other Ambulatory Visit

## 2024-06-21 VITALS — BP 110/51 | HR 71 | Temp 98.0°F | Resp 18

## 2024-06-21 VITALS — BP 124/73 | HR 78 | Temp 97.8°F | Resp 18 | Ht 63.0 in | Wt 229.4 lb

## 2024-06-21 DIAGNOSIS — D763 Other histiocytosis syndromes: Secondary | ICD-10-CM

## 2024-06-21 DIAGNOSIS — M549 Dorsalgia, unspecified: Secondary | ICD-10-CM | POA: Diagnosis not present

## 2024-06-21 DIAGNOSIS — J45909 Unspecified asthma, uncomplicated: Secondary | ICD-10-CM | POA: Diagnosis not present

## 2024-06-21 DIAGNOSIS — Z79620 Long term (current) use of immunosuppressive biologic: Secondary | ICD-10-CM | POA: Diagnosis not present

## 2024-06-21 DIAGNOSIS — E785 Hyperlipidemia, unspecified: Secondary | ICD-10-CM | POA: Diagnosis not present

## 2024-06-21 DIAGNOSIS — R911 Solitary pulmonary nodule: Secondary | ICD-10-CM | POA: Diagnosis not present

## 2024-06-21 DIAGNOSIS — I10 Essential (primary) hypertension: Secondary | ICD-10-CM | POA: Diagnosis not present

## 2024-06-21 DIAGNOSIS — Z5112 Encounter for antineoplastic immunotherapy: Secondary | ICD-10-CM | POA: Insufficient documentation

## 2024-06-21 DIAGNOSIS — E119 Type 2 diabetes mellitus without complications: Secondary | ICD-10-CM | POA: Insufficient documentation

## 2024-06-21 LAB — CMP (CANCER CENTER ONLY)
ALT: 9 U/L (ref 0–44)
AST: 16 U/L (ref 15–41)
Albumin: 4.3 g/dL (ref 3.5–5.0)
Alkaline Phosphatase: 127 U/L — ABNORMAL HIGH (ref 38–126)
Anion gap: 12 (ref 5–15)
BUN: 16 mg/dL (ref 8–23)
CO2: 25 mmol/L (ref 22–32)
Calcium: 10.1 mg/dL (ref 8.9–10.3)
Chloride: 105 mmol/L (ref 98–111)
Creatinine: 0.98 mg/dL (ref 0.44–1.00)
GFR, Estimated: >60 mL/min (ref >60)
Glucose, Bld: 260 mg/dL — ABNORMAL HIGH (ref 70–99)
Potassium: 3.6 mmol/L (ref 3.5–5.1)
Sodium: 142 mmol/L (ref 135–145)
Total Bilirubin: 0.3 mg/dL (ref 0.0–1.2)
Total Protein: 6.9 g/dL (ref 6.5–8.1)

## 2024-06-21 LAB — CBC WITH DIFFERENTIAL (CANCER CENTER ONLY)
Abs Immature Granulocytes: 0.02 K/uL (ref 0.00–0.07)
Basophils Absolute: 0 K/uL (ref 0.0–0.1)
Basophils Relative: 0 %
Eosinophils Absolute: 0.5 K/uL (ref 0.0–0.5)
Eosinophils Relative: 7 %
HCT: 41.1 % (ref 36.0–46.0)
Hemoglobin: 13.4 g/dL (ref 12.0–15.0)
Immature Granulocytes: 0 %
Lymphocytes Relative: 24 %
Lymphs Abs: 1.7 K/uL (ref 0.7–4.0)
MCH: 27.9 pg (ref 26.0–34.0)
MCHC: 32.6 g/dL (ref 30.0–36.0)
MCV: 85.6 fL (ref 80.0–100.0)
Monocytes Absolute: 0.5 K/uL (ref 0.1–1.0)
Monocytes Relative: 8 %
Neutro Abs: 4.1 K/uL (ref 1.7–7.7)
Neutrophils Relative %: 61 %
Platelet Count: 204 K/uL (ref 150–400)
RBC: 4.8 MIL/uL (ref 3.87–5.11)
RDW: 14.7 % (ref 11.5–15.5)
WBC Count: 6.8 K/uL (ref 4.0–10.5)
nRBC: 0 % (ref 0.0–0.2)

## 2024-06-21 MED ORDER — SODIUM CHLORIDE 0.9 % IV SOLN
375.0000 mg/m2 | Freq: Once | INTRAVENOUS | Status: DC
  Filled 2024-06-21: qty 80

## 2024-06-21 MED ORDER — ACETAMINOPHEN 325 MG PO TABS
650.0000 mg | ORAL_TABLET | Freq: Once | ORAL | Status: AC
  Administered 2024-06-21: 650 mg via ORAL
  Filled 2024-06-21: qty 2

## 2024-06-21 MED ORDER — FAMOTIDINE IN NACL 20-0.9 MG/50ML-% IV SOLN
20.0000 mg | Freq: Once | INTRAVENOUS | Status: AC
  Administered 2024-06-21: 20 mg via INTRAVENOUS
  Filled 2024-06-21: qty 50

## 2024-06-21 MED ORDER — PROCHLORPERAZINE MALEATE 10 MG PO TABS
10.0000 mg | ORAL_TABLET | Freq: Four times a day (QID) | ORAL | Status: DC | PRN
  Administered 2024-06-21: 10 mg via ORAL
  Filled 2024-06-21: qty 1

## 2024-06-21 MED ORDER — SODIUM CHLORIDE 0.9 % IV SOLN
375.0000 mg/m2 | Freq: Once | INTRAVENOUS | Status: AC
  Administered 2024-06-21: 800 mg via INTRAVENOUS
  Filled 2024-06-21: qty 50

## 2024-06-21 MED ORDER — SODIUM CHLORIDE 0.9 % IV SOLN: Freq: Once | INTRAVENOUS | Status: AC

## 2024-06-21 MED ORDER — DIPHENHYDRAMINE HCL 25 MG PO CAPS
50.0000 mg | ORAL_CAPSULE | Freq: Once | ORAL | Status: AC
  Administered 2024-06-21: 50 mg via ORAL
  Filled 2024-06-21: qty 2

## 2024-06-21 NOTE — Progress Notes (Signed)
 Patient seen by Dr. Arley Hof today  Vitals are within treatment parameters:Yes   Labs are within treatment parameters: Yes   Treatment plan has been signed: Yes   Per physician team, Patient is ready for treatment and there are NO modifications to the treatment plan.

## 2024-06-21 NOTE — Patient Instructions (Signed)
 [facility]  Discharge Instructions: Thank you for choosing [facility] to provide your oncology and hematology care.   If you have a lab appointment with the Cancer Center, please go directly to the Cancer Center and check in at the registration area.   Wear comfortable clothing and clothing appropriate for easy access to any Portacath or PICC line.   We strive to give you quality time with your provider. You may need to reschedule your appointment if you arrive late (15 or more minutes).  Arriving late affects you and other patients whose appointments are after yours.  Also, if you miss three or more appointments without notifying the office, you may be dismissed from the clinic at the provider's discretion.      For prescription refill requests, have your pharmacy contact our office and allow 72 hours for refills to be completed.    Today you received the following chemotherapy and/or immunotherapy agents Rituxan      To help prevent nausea and vomiting after your treatment, we encourage you to take your nausea medication as directed.  BELOW ARE SYMPTOMS THAT SHOULD BE REPORTED IMMEDIATELY: *FEVER GREATER THAN 100.4 F (38 C) OR HIGHER *CHILLS OR SWEATING *NAUSEA AND VOMITING THAT IS NOT CONTROLLED WITH YOUR NAUSEA MEDICATION *UNUSUAL SHORTNESS OF BREATH *UNUSUAL BRUISING OR BLEEDING *URINARY PROBLEMS (pain or burning when urinating, or frequent urination) *BOWEL PROBLEMS (unusual diarrhea, constipation, pain near the anus) TENDERNESS IN MOUTH AND THROAT WITH OR WITHOUT PRESENCE OF ULCERS (sore throat, sores in mouth, or a toothache) UNUSUAL RASH, SWELLING OR PAIN  UNUSUAL VAGINAL DISCHARGE OR ITCHING   Items with * indicate a potential emergency and should be followed up as soon as possible or go to the Emergency Department if any problems should occur.  Please show the CHEMOTHERAPY ALERT CARD or IMMUNOTHERAPY  ALERT CARD at check-in to the Emergency Department and triage nurse.  Should you have questions after your visit or need to cancel or reschedule your appointment, please contact [facility]  Dept: [phone]  and follow the prompts.  Office hours are 8:00 a.m. to 4:30 p.m. Monday - Friday. Please note that voicemails left after 4:00 p.m. may not be returned until the following business day.  We are closed weekends and major holidays. You have access to a nurse at all times for urgent questions. Please call the main number to the clinic Dept: [phone] and follow the prompts.   For any non-urgent questions, you may also contact your provider using MyChart. We now offer e-Visits for anyone 36 and older to request care online for non-urgent symptoms. For details visit [email or web address].   Also download the MyChart app! Go to the app store, search "MyChart", open the app, select [place], and log in with your MyChart username and password.

## 2024-06-21 NOTE — Progress Notes (Signed)
 [facility] OFFICE PROGRESS NOTE   Diagnosis: Rosai-Dorfman syndrome  INTERVAL HISTORY:   Ms. Dralle completed another treat with rituximab  on 03/29/2024.  She reports tolerating rituximab  well.  The skin lesions are resolving.  No new lesions. She continues to have back pain.  She had another lumbar surgery in July for removal of a loose L3 screw and a redo posterolateral arthrodesis at L3-4.  There was a recent asymmetry on a mammogram at her gynecologist office.  She is scheduled for a diagnostic mammogram.  She has not noted a palpable change.  Objective:  Vital signs in last 24 hours:  Blood pressure 124/73, pulse 78, temperature 97.8 F (36.6 C), temperature source Temporal, resp. rate 18, height 5' 3 (1.6 m), weight 229 lb 6.4 oz (104.1 kg), SpO2 98%.   Lymphatics: No cervical, supraclavicular, axillary, or inguinal nodes Resp: End inspiratory rhonchi at the left posterior base, no respiratory distress Cardio: Regular rate and rhythm GI: No hepatosplenomegaly Vascular: No leg edema  Skin: 4 cm hyperpigmented lesion at the left upper buttock-the lesion is flat, flat 2 cm hyperpigmented lesion at the left upper arm, slightly raised 1.5 cm hyperpigmented lesion at the left anterior thigh   Lab Results:  Lab Results  Component Value Date   WBC 6.8 06/21/2024   HGB 13.4 06/21/2024   HCT 41.1 06/21/2024   MCV 85.6 06/21/2024   PLT 204 06/21/2024   NEUTROABS 4.1 06/21/2024    CMP  Lab Results  Component Value Date   NA 142 06/21/2024   K 3.6 06/21/2024   CL 105 06/21/2024   CO2 25 06/21/2024   GLUCOSE 260 (H) 06/21/2024   BUN 16 06/21/2024   CREATININE 0.98 06/21/2024   CALCIUM  10.1 06/21/2024   PROT 6.9 06/21/2024   ALBUMIN 4.3 06/21/2024   AST 16 06/21/2024   ALT 9 06/21/2024   ALKPHOS 127 (H) 06/21/2024   BILITOT 0.3 06/21/2024   GFRNONAA >60 06/21/2024    No results found for: CEA1, CEA, CAN199, CA125  No results found for:  INR, LABPROT  Imaging:  No results found.  Medications: I have reviewed the patient's current medications.   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 PET October 2013-numerous FDG avid subdermal/subcutaneous nodules and bone lesions Prednisone October 2013 with initial response, tapered until February 2014 when skin lesions enlarged Rituximab  4 weekly doses beginning, followed by rituximab  maintenance with completion of 2 years January 2016 Disease progression July 2019-no response to rituximab  or Gleevec Revlimid/Decadron  August 2019-partial response 01/28/2022-enlarging cutaneous lesion left anterior thigh, possible small similar lesions at the left abdomen and left lower leg PET 03/11/2022-heterogenous mildly enlarged right thyroid  with calcification, cutaneous left anterior thigh lesion with moderate metabolic activity, cutaneous lesion at the mid anterior chest-nonspecific, left upper lobe pulmonary nodule with low-level FDG activity less than mediastinal blood pool-nonspecific Thalidomide  beginning 09/13/2022 Thalidomide  discontinued 12/06/2022 secondary to apparent toxicities (malaise, headaches,1 weight gain (and no regression of the cutaneous lesions) PET scan 01/21/2023-intense hypermetabolic subcutaneous nodular thickening in the posterior left buttock region; similar metabolic cutaneous lesion anterior left thigh; intense metabolic activity lateral to the right lateral femoral condyle; intense radiotracer activity in the dorsum of the left foot superficial to the proximal third phalanx 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  Stable left upper lobe pulmonary nodule with very low metabolic activity. Methotrexate , 10 mg weekly, beginning 02/07/2023, last dose 04/04/2023 Methotrexate  discontinued secondary to development of a new lesion at the left upper  arm and lack of a Cycle 1 weekly Rituxan  04/20/2023 Cycle 2 weekly Rituxan   04/27/2023 Cycle 3 weekly rituximab  05/04/2023 Cycle 4 weekly rituximab  05/12/2023 PET scan 09/26/2023-pending Rituximab  every 3 months x 1 year beginning 10/05/2023 Diabetes Hypertension Hyperlipidemia Asthma/seasonal allergies Left lung nodule on PET 03/11/2022 CT chest 07/12/2022-unchanged left upper lobe nodule, no new suspicious nodules or masses heterogenous enlargement of the thyroid  7.  Multinodular thyroid -2 right-sided nodules meet criteria for biopsy and a calcified nodule in the right mid thyroid  meets criteria for surveillance imaging 8.  L5-S1 decompressive laminotomies 1/5, 03/09/2024 removal of loose L3 screw and redo posterolateral arthrodesis at L3-4 on the left      Disposition: Ms. Hetzer appears stable.  The Rosai-Dorfman lesions appear to be slowly resolving.  She will complete a final cycle of rituximab  maintenance today.  She is scheduled for follow-up at [facility] in January.  She will return for an office visit here in 4 months.  She is scheduled for a diagnostic mammogram/ultrasound next week.  I am available to see her if there is an abnormal finding.    Arley Hof, MD  06/21/2024  10:07 AM

## 2024-06-25 ENCOUNTER — Encounter: Payer: Self-pay | Admitting: Oncology

## 2024-06-25 ENCOUNTER — Ambulatory Visit
Admission: RE | Admit: 2024-06-25 | Discharge: 2024-06-25 | Disposition: A | Source: Ambulatory Visit | Attending: Family Medicine | Admitting: Family Medicine

## 2024-06-25 ENCOUNTER — Ambulatory Visit
Admission: RE | Admit: 2024-06-25 | Discharge: 2024-06-25 | Disposition: A | Discharge status: Home / Self Care | Source: Ambulatory Visit | Attending: Family Medicine | Admitting: Family Medicine

## 2024-06-25 DIAGNOSIS — R928 Other abnormal and inconclusive findings on diagnostic imaging of breast: Secondary | ICD-10-CM

## 2024-07-12 ENCOUNTER — Encounter: Payer: Self-pay | Admitting: Oncology

## 2024-08-29 ENCOUNTER — Encounter: Payer: Self-pay | Admitting: Oncology

## 2024-09-03 ENCOUNTER — Other Ambulatory Visit: Payer: Self-pay | Admitting: Oncology

## 2024-09-03 ENCOUNTER — Other Ambulatory Visit: Payer: Self-pay | Admitting: *Deleted

## 2024-09-03 ENCOUNTER — Encounter: Payer: Self-pay | Admitting: Oncology

## 2024-09-03 DIAGNOSIS — D763 Other histiocytosis syndromes: Secondary | ICD-10-CM

## 2024-09-03 DIAGNOSIS — R591 Generalized enlarged lymph nodes: Secondary | ICD-10-CM

## 2024-09-03 DIAGNOSIS — C8598 Non-Hodgkin lymphoma, unspecified, lymph nodes of multiple sites: Secondary | ICD-10-CM

## 2024-09-03 DIAGNOSIS — C779 Secondary and unspecified malignant neoplasm of lymph node, unspecified: Secondary | ICD-10-CM

## 2024-09-06 ENCOUNTER — Encounter (HOSPITAL_COMMUNITY)
Admission: RE | Admit: 2024-09-06 | Discharge: 2024-09-06 | Disposition: A | Discharge status: Home / Self Care | Source: Ambulatory Visit | Attending: Oncology | Admitting: Oncology

## 2024-09-06 DIAGNOSIS — D763 Other histiocytosis syndromes: Secondary | ICD-10-CM | POA: Diagnosis present

## 2024-09-06 DIAGNOSIS — C8598 Non-Hodgkin lymphoma, unspecified, lymph nodes of multiple sites: Secondary | ICD-10-CM | POA: Diagnosis present

## 2024-09-06 LAB — GLUCOSE, CAPILLARY: Glucose-Capillary: 99 mg/dL (ref 70–99)

## 2024-09-06 MED ORDER — FLUDEOXYGLUCOSE F - 18 (FDG) INJECTION
11.0000 | Freq: Once | INTRAVENOUS | Status: AC
  Administered 2024-09-06: 11.73 via INTRAVENOUS

## 2024-09-09 ENCOUNTER — Other Ambulatory Visit: Payer: Self-pay | Admitting: Oncology

## 2024-09-09 DIAGNOSIS — D763 Other histiocytosis syndromes: Secondary | ICD-10-CM

## 2024-09-11 ENCOUNTER — Other Ambulatory Visit: Payer: Self-pay | Admitting: *Deleted

## 2024-09-11 DIAGNOSIS — D763 Other histiocytosis syndromes: Secondary | ICD-10-CM

## 2024-09-12 ENCOUNTER — Inpatient Hospital Stay

## 2024-09-12 ENCOUNTER — Inpatient Hospital Stay: Attending: Oncology

## 2024-09-12 ENCOUNTER — Inpatient Hospital Stay: Admitting: Oncology

## 2024-09-12 VITALS — BP 97/61 | HR 72 | Temp 98.1°F | Resp 16

## 2024-09-12 VITALS — BP 98/66 | HR 76 | Temp 97.9°F | Resp 18 | Ht 63.0 in | Wt 231.8 lb

## 2024-09-12 DIAGNOSIS — D763 Other histiocytosis syndromes: Secondary | ICD-10-CM | POA: Diagnosis not present

## 2024-09-12 DIAGNOSIS — E785 Hyperlipidemia, unspecified: Secondary | ICD-10-CM | POA: Insufficient documentation

## 2024-09-12 DIAGNOSIS — J45909 Unspecified asthma, uncomplicated: Secondary | ICD-10-CM | POA: Insufficient documentation

## 2024-09-12 DIAGNOSIS — I10 Essential (primary) hypertension: Secondary | ICD-10-CM | POA: Insufficient documentation

## 2024-09-12 DIAGNOSIS — E119 Type 2 diabetes mellitus without complications: Secondary | ICD-10-CM | POA: Insufficient documentation

## 2024-09-12 DIAGNOSIS — Z5112 Encounter for antineoplastic immunotherapy: Secondary | ICD-10-CM | POA: Insufficient documentation

## 2024-09-12 DIAGNOSIS — R911 Solitary pulmonary nodule: Secondary | ICD-10-CM | POA: Insufficient documentation

## 2024-09-12 DIAGNOSIS — Z79620 Long term (current) use of immunosuppressive biologic: Secondary | ICD-10-CM | POA: Insufficient documentation

## 2024-09-12 DIAGNOSIS — E042 Nontoxic multinodular goiter: Secondary | ICD-10-CM | POA: Insufficient documentation

## 2024-09-12 LAB — CMP (CANCER CENTER ONLY)
ALT: 8 U/L (ref 0–44)
AST: 15 U/L (ref 15–41)
Albumin: 4.5 g/dL (ref 3.5–5.0)
Alkaline Phosphatase: 110 U/L (ref 38–126)
Anion gap: 14 (ref 5–15)
BUN: 22 mg/dL (ref 8–23)
CO2: 23 mmol/L (ref 22–32)
Calcium: 9.9 mg/dL (ref 8.9–10.3)
Chloride: 104 mmol/L (ref 98–111)
Creatinine: 1.05 mg/dL — ABNORMAL HIGH (ref 0.44–1.00)
GFR, Estimated: 57 mL/min — ABNORMAL LOW
Glucose, Bld: 238 mg/dL — ABNORMAL HIGH (ref 70–99)
Potassium: 3.8 mmol/L (ref 3.5–5.1)
Sodium: 142 mmol/L (ref 135–145)
Total Bilirubin: 0.2 mg/dL (ref 0.0–1.2)
Total Protein: 7 g/dL (ref 6.5–8.1)

## 2024-09-12 LAB — CBC WITH DIFFERENTIAL (CANCER CENTER ONLY)
Abs Immature Granulocytes: 0.03 K/uL (ref 0.00–0.07)
Basophils Absolute: 0.1 K/uL (ref 0.0–0.1)
Basophils Relative: 1 %
Eosinophils Absolute: 0.3 K/uL (ref 0.0–0.5)
Eosinophils Relative: 3 %
HCT: 41.5 % (ref 36.0–46.0)
Hemoglobin: 13.6 g/dL (ref 12.0–15.0)
Immature Granulocytes: 0 %
Lymphocytes Relative: 26 %
Lymphs Abs: 2 K/uL (ref 0.7–4.0)
MCH: 27.7 pg (ref 26.0–34.0)
MCHC: 32.8 g/dL (ref 30.0–36.0)
MCV: 84.5 fL (ref 80.0–100.0)
Monocytes Absolute: 0.6 K/uL (ref 0.1–1.0)
Monocytes Relative: 8 %
Neutro Abs: 4.8 K/uL (ref 1.7–7.7)
Neutrophils Relative %: 62 %
Platelet Count: 202 K/uL (ref 150–400)
RBC: 4.91 MIL/uL (ref 3.87–5.11)
RDW: 16.2 % — ABNORMAL HIGH (ref 11.5–15.5)
WBC Count: 7.8 K/uL (ref 4.0–10.5)
nRBC: 0 % (ref 0.0–0.2)

## 2024-09-12 MED ORDER — SODIUM CHLORIDE 0.9 % IV SOLN
375.0000 mg/m2 | Freq: Once | INTRAVENOUS | Status: AC
  Administered 2024-09-12: 800 mg via INTRAVENOUS
  Filled 2024-09-12: qty 50

## 2024-09-12 MED ORDER — PROCHLORPERAZINE MALEATE 10 MG PO TABS
10.0000 mg | ORAL_TABLET | Freq: Four times a day (QID) | ORAL | Status: DC | PRN
  Administered 2024-09-12: 10 mg via ORAL
  Filled 2024-09-12: qty 1

## 2024-09-12 MED ORDER — FAMOTIDINE IN NACL 20-0.9 MG/50ML-% IV SOLN
20.0000 mg | Freq: Once | INTRAVENOUS | Status: AC
  Administered 2024-09-12: 20 mg via INTRAVENOUS
  Filled 2024-09-12: qty 50

## 2024-09-12 MED ORDER — SODIUM CHLORIDE 0.9 % IV SOLN: Freq: Once | INTRAVENOUS | Status: AC

## 2024-09-12 MED ORDER — DIPHENHYDRAMINE HCL 25 MG PO CAPS
50.0000 mg | ORAL_CAPSULE | Freq: Once | ORAL | Status: AC
  Administered 2024-09-12: 50 mg via ORAL
  Filled 2024-09-12: qty 2

## 2024-09-12 MED ORDER — SODIUM CHLORIDE 0.9 % IV SOLN
375.0000 mg/m2 | Freq: Once | INTRAVENOUS | Status: DC
  Filled 2024-09-12: qty 80

## 2024-09-12 MED ORDER — ACETAMINOPHEN 325 MG PO TABS
650.0000 mg | ORAL_TABLET | Freq: Once | ORAL | Status: AC
  Administered 2024-09-12: 650 mg via ORAL
  Filled 2024-09-12: qty 2

## 2024-09-12 NOTE — Patient Instructions (Signed)
 [facility] - A DEPT OF [facility]  Discharge Instructions: Thank you for choosing [facility] to provide your oncology and hematology care.   If you have a lab appointment with the Cancer Center, please go directly to the Cancer Center and check in at the registration area.   Wear comfortable clothing and clothing appropriate for easy access to any Portacath or PICC line.   We strive to give you quality time with your provider. You may need to reschedule your appointment if you arrive late (15 or more minutes).  Arriving late affects you and other patients whose appointments are after yours.  Also, if you miss three or more appointments without notifying the office, you may be dismissed from the clinic at the providers discretion.      For prescription refill requests, have your pharmacy contact our office and allow 72 hours for refills to be completed.    Today you received the following chemotherapy and/or immunotherapy agents Rituximab .  Rituximab  Injection What is this medication? RITUXIMAB  (ri TUX i mab) treats leukemia and lymphoma. It works by blocking a protein that causes cancer cells to grow and multiply. This helps to slow or stop the spread of cancer cells. It may also be used to treat autoimmune conditions, such as arthritis. It works by slowing down an overactive immune system. It is a monoclonal antibody. This medicine may be used for other purposes; ask your health care provider or pharmacist if you have questions. COMMON BRAND NAME(S): RIABNI , Rituxan , RUXIENCE , truxima  What should I tell my care team before I take this medication? They need to know if you have any of these conditions: Chest pain Heart disease Immune system problems Infection, such as chickenpox, cold sores, hepatitis B, herpes Irregular heartbeat or rhythm Kidney disease Low blood counts, such as low white cells, platelets, red cells Lung disease Recent  or upcoming vaccine An unusual or allergic reaction to rituximab , other medications, foods, dyes, or preservatives Pregnant or trying to get pregnant Breast-feeding How should I use this medication? This medication is injected into a vein. It is given by a care team in a hospital or clinic setting. A special MedGuide will be given to you before each treatment. Be sure to read this information carefully each time. Talk to your care team about the use of this medication in children. While this medication may be prescribed for children as young as 6 months for selected conditions, precautions do apply. Overdosage: If you think you have taken too much of this medicine contact a poison control center or emergency room at once. NOTE: This medicine is only for you. Do not share this medicine with others. What if I miss a dose? Keep appointments for follow-up doses. It is important not to miss your dose. Call your care team if you are unable to keep an appointment. What may interact with this medication? Do not take this medication with any of the following: Live vaccines This medication may also interact with the following: Cisplatin This list may not describe all possible interactions. Give your health care provider a list of all the medicines, herbs, non-prescription drugs, or dietary supplements you use. Also tell them if you smoke, drink alcohol, or use illegal drugs. Some items may interact with your medicine. What should I watch for while using this medication? Your condition will be monitored carefully while you are receiving this medication. You may need blood work while taking this medication. This  medication can cause serious infusion reactions. To reduce the risk your care team may give you other medications to take before receiving this one. Be sure to follow the directions from your care team. This medication may increase your risk of getting an infection. Call your care team for advice  if you get a fever, chills, sore throat, or other symptoms of a cold or flu. Do not treat yourself. Try to avoid being around people who are sick. Call your care team if you are around anyone with measles, chickenpox, or if you develop sores or blisters that do not heal properly. Avoid taking medications that contain aspirin , acetaminophen , ibuprofen , naproxen, or ketoprofen unless instructed by your care team. These medications may hide a fever. This medication may cause serious skin reactions. They can happen weeks to months after starting the medication. Contact your care team right away if you notice fevers or flu-like symptoms with a rash. The rash may be red or purple and then turn into blisters or peeling of the skin. You may also notice a red rash with swelling of the face, lips, or lymph nodes in your neck or under your arms. In some patients, this medication may cause a serious brain infection that may cause death. If you have any problems seeing, thinking, speaking, walking, or standing, tell your care team right away. If you cannot reach your care team, urgently seek another source of medical care. Talk to your care team if you may be pregnant. Serious birth defects can occur if you take this medication during pregnancy and for 12 months after the last dose. You will need a negative pregnancy test before starting this medication. Contraception is recommended while taking this medication and for 12 months after the last dose. Your care team can help you find the option that works for you. Do not breastfeed while taking this medication and for at least 6 months after the last dose. What side effects may I notice from receiving this medication? Side effects that you should report to your care team as soon as possible: Allergic reactions or angioedema--skin rash, itching or hives, swelling of the face, eyes, lips, tongue, arms, or legs, trouble swallowing or breathing Bowel blockage--stomach  cramping, unable to have a bowel movement or pass gas, loss of appetite, vomiting Dizziness, loss of balance or coordination, confusion or trouble speaking Heart attack--pain or tightness in the chest, shoulders, arms, or jaw, nausea, shortness of breath, cold or clammy skin, feeling faint or lightheaded Heart rhythm changes--fast or irregular heartbeat, dizziness, feeling faint or lightheaded, chest pain, trouble breathing Infection--fever, chills, cough, sore throat, wounds that don't heal, pain or trouble when passing urine, general feeling of discomfort or being unwell Infusion reactions--chest pain, shortness of breath or trouble breathing, feeling faint or lightheaded Kidney injury--decrease in the amount of urine, swelling of the ankles, hands, or feet Liver injury--right upper belly pain, loss of appetite, nausea, light-colored stool, dark yellow or brown urine, yellowing skin or eyes, unusual weakness or fatigue Redness, blistering, peeling, or loosening of the skin, including inside the mouth Stomach pain that is severe, does not go away, or gets worse Tumor lysis syndrome (TLS)--nausea, vomiting, diarrhea, decrease in the amount of urine, dark urine, unusual weakness or fatigue, confusion, muscle pain or cramps, fast or irregular heartbeat, joint pain Side effects that usually do not require medical attention (report to your care team if they continue or are bothersome): Headache Joint pain Nausea Runny or stuffy nose Unusual weakness or  fatigue This list may not describe all possible side effects. Call your doctor for medical advice about side effects. You may report side effects to FDA at 1-800-FDA-1088. Where should I keep my medication? This medication is given in a hospital or clinic. It will not be stored at home. NOTE: This sheet is a summary. It may not cover all possible information. If you have questions about this medicine, talk to your doctor, pharmacist, or health care  provider.  2024 Elsevier/Gold Standard (2021-12-31 00:00:00)  To help prevent nausea and vomiting after your treatment, we encourage you to take your nausea medication as directed.  BELOW ARE SYMPTOMS THAT SHOULD BE REPORTED IMMEDIATELY: *FEVER GREATER THAN 100.4 F (38 C) OR HIGHER *CHILLS OR SWEATING *NAUSEA AND VOMITING THAT IS NOT CONTROLLED WITH YOUR NAUSEA MEDICATION *UNUSUAL SHORTNESS OF BREATH *UNUSUAL BRUISING OR BLEEDING *URINARY PROBLEMS (pain or burning when urinating, or frequent urination) *BOWEL PROBLEMS (unusual diarrhea, constipation, pain near the anus) TENDERNESS IN MOUTH AND THROAT WITH OR WITHOUT PRESENCE OF ULCERS (sore throat, sores in mouth, or a toothache) UNUSUAL RASH, SWELLING OR PAIN  UNUSUAL VAGINAL DISCHARGE OR ITCHING   Items with * indicate a potential emergency and should be followed up as soon as possible or go to the Emergency Department if any problems should occur.  Please show the CHEMOTHERAPY ALERT CARD or IMMUNOTHERAPY ALERT CARD at check-in to the Emergency Department and triage nurse.  Should you have questions after your visit or need to cancel or reschedule your appointment, please contact [facility]  Dept: [phone]  and follow the prompts.  Office hours are 8:00 a.m. to 4:30 p.m. Monday - Friday. Please note that voicemails left after 4:00 p.m. may not be returned until the following business day.  We are closed weekends and major holidays. You have access to a nurse at all times for urgent questions. Please call the main number to the clinic Dept: [phone] and follow the prompts.   For any non-urgent questions, you may also contact your provider using MyChart. We now offer e-Visits for anyone 48 and older to request care online for non-urgent symptoms. For details visit [email or web address].   Also download the MyChart app! Go to the app store, search MyChart, open the app,  select [place], and log in with your MyChart username and password.

## 2024-09-12 NOTE — Progress Notes (Signed)
 Patient seen by Dr. Arley Hof today  Vitals are within treatment parameters:Yes   Labs are within treatment parameters: Yes  OK to proceed without CMP results  Treatment plan has been signed: Yes   Per physician team, Patient is ready for treatment and there are NO modifications to the treatment plan.

## 2024-09-12 NOTE — Progress Notes (Signed)
 Patient presents today for chemotherapy/immunotherapy infusion of Rituximab . Patient is in satisfactory condition with no new complaints voiced.  Vital signs are stable.  Labs reviewed by Dr Cloretta during the office visit and all labs are within treatment parameters.  We will proceed with treatment per MD orders.   Patient tolerated treatment well with no complaints voiced.  Patient left ambulatory in stable condition.  Vital signs stable at discharge.  Follow up as scheduled.

## 2024-09-12 NOTE — Progress Notes (Signed)
 " [facility] OFFICE PROGRESS NOTE   Diagnosis: Rosai-Dorfman disease  INTERVAL HISTORY:   [name] was last treated with rituximab  on 06/21/2024.  She reports tolerating the rituximab  well.  No symptom of an allergic reaction.  She reports 2 upper respiratory infections over the past several months.  Symptoms have improved.  She has chronic back pain.  No fever or night sweats.  Her blood sugar has been under better control since starting glipizide .  She saw Dr. Jinx earlier this month.  He recommends continuing rituximab  maintenance for a total of 2 years.   Objective:  Vital signs in last 24 hours:  Blood pressure 98/66, pulse 76, temperature 97.9 F (36.6 C), temperature source Temporal, resp. rate 18, height 5' 3 (1.6 m), weight 231 lb 12.8 oz (105.1 kg), SpO2 100%.   Lymphatics: No cervical, supraclavicular, axillary, or inguinal nodes, scarring from hidradenitis in the bilateral axillae Resp: Lungs clear bilaterally Cardio: Regular rate and rhythm GI: No hepatosplenomegaly Vascular: Leg edema  Skin: 4 cm brown flat lesion at the left buttock with superficial skin flaking medial to the lesion, 1.5 cm flat mildly hyperpigmented lesion at the left upper arm, 2 cm flat brown area at the left anterior thigh   Lab Results:  Lab Results  Component Value Date   WBC 7.8 09/12/2024   HGB 13.6 09/12/2024   HCT 41.5 09/12/2024   MCV 84.5 09/12/2024   PLT 202 09/12/2024   NEUTROABS 4.8 09/12/2024    CMP  Lab Results  Component Value Date   NA 142 06/21/2024   K 3.6 06/21/2024   CL 105 06/21/2024   CO2 25 06/21/2024   GLUCOSE 260 (H) 06/21/2024   BUN 16 06/21/2024   CREATININE 0.98 06/21/2024   CALCIUM  10.1 06/21/2024   PROT 6.9 06/21/2024   ALBUMIN 4.3 06/21/2024   AST 16 06/21/2024   ALT 9 06/21/2024   ALKPHOS 127 (H) 06/21/2024   BILITOT 0.3 06/21/2024   GFRNONAA >60 06/21/2024    Medications: I have reviewed the patient's current  medications.   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 PET October 2013-numerous FDG avid subdermal/subcutaneous nodules and bone lesions Prednisone October 2013 with initial response, tapered until February 2014 when skin lesions enlarged Rituximab  4 weekly doses beginning, followed by rituximab  maintenance with completion of 2 years January 2016 Disease progression July 2019-no response to rituximab  or Gleevec Revlimid/Decadron  August 2019-partial response 01/28/2022-enlarging cutaneous lesion left anterior thigh, possible small similar lesions at the left abdomen and left lower leg PET 03/11/2022-heterogenous mildly enlarged right thyroid  with calcification, cutaneous left anterior thigh lesion with moderate metabolic activity, cutaneous lesion at the mid anterior chest-nonspecific, left upper lobe pulmonary nodule with low-level FDG activity less than mediastinal blood pool-nonspecific Thalidomide  beginning 09/13/2022 Thalidomide  discontinued 12/06/2022 secondary to apparent toxicities (malaise, headaches,1 weight gain (and no regression of the cutaneous lesions) PET scan 01/21/2023-intense hypermetabolic subcutaneous nodular thickening in the posterior left buttock region; similar metabolic cutaneous lesion anterior left thigh; intense metabolic activity lateral to the right lateral femoral condyle; intense radiotracer activity in the dorsum of the left foot superficial to the proximal third phalanx 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  Stable left upper lobe pulmonary nodule with very low metabolic activity. Methotrexate , 10 mg weekly, beginning 02/07/2023, last dose 04/04/2023 Methotrexate  discontinued secondary to development of a new lesion at the left upper arm and lack of a Cycle 1 weekly Rituxan  04/20/2023 Cycle 2 weekly Rituxan  04/27/2023 Cycle 3  weekly rituximab  05/04/2023 Cycle 4 weekly rituximab  05/12/2023 PET scan 09/26/2023-increased  activity at the lateral distal right femoral condyle, increased activity involving the posterior soft tissue of the left upper arm-improved, left gluteal lesion is smaller and less hypermetabolic, anterior right thigh lesion is stable in size with decreased metabolic activity, resolution of uptake associated with the dorsal soft tissue of the third proximal phalanx of the left foot, stable 8 mm left upper lobe nodule with mild increased activity, mildly tracer avid small pelvic nodes of unknown significance Rituximab  every 3 months x 1 year beginning 10/05/2023 PET 09/06/2024: Stable left upper lobe nodule with mild increased SUV,, previously noted hypermetabolic presacral node no longer identified, skin thickening with hypermetabolism at the left gluteus is improved, no new site of disease Diabetes Hypertension Hyperlipidemia Asthma/seasonal allergies Left lung nodule on PET 03/11/2022 CT chest 07/12/2022-unchanged left upper lobe nodule, no new suspicious nodules or masses heterogenous enlargement of the thyroid  7.  Multinodular thyroid -2 right-sided nodules meet criteria for biopsy and a calcified nodule in the right mid thyroid  meets criteria for surveillance imaging 8.  L5-S1 decompressive laminotomies 1/5, 03/09/2024 removal of loose L3 screw and redo posterolateral arthrodesis at L3-4 on the left       Disposition: Shari Hill appears stable.  She has been maintained on rituximab  since August 2024.  She has tolerated the rituximab  well.  The restaging PET reveals continued improvement in the dominant left gluteal lesion.  On physical exam the lesions at the left upper arm and left thigh also appear improved.  The significance of the small left lung nodule is unclear.  The nodule has been stable in size over multiple CTs. Shari Hill will complete another treatment with maintenance rituximab  today.  She will return for an office visit in brentuximab in 3 months.  The plan is to continue rituximab   maintenance for a total of 2 years. Arley Hof, MD  09/12/2024  9:08 AM   "

## 2024-09-13 ENCOUNTER — Other Ambulatory Visit: Payer: Self-pay

## 2024-09-17 ENCOUNTER — Other Ambulatory Visit: Payer: Self-pay

## 2024-11-27 ENCOUNTER — Other Ambulatory Visit (HOSPITAL_BASED_OUTPATIENT_CLINIC_OR_DEPARTMENT_OTHER)

## 2024-12-12 ENCOUNTER — Inpatient Hospital Stay

## 2024-12-12 ENCOUNTER — Inpatient Hospital Stay: Admitting: Oncology
